# Patient Record
Sex: Female | Born: 1990 | Race: White | Hispanic: No | Smoking: Current every day smoker
Health system: Southern US, Community
[De-identification: ages and names within clinical notes are randomized; demographics above are authoritative.]

## PROBLEM LIST (undated history)

## (undated) ENCOUNTER — Inpatient Hospital Stay (HOSPITAL_COMMUNITY): Payer: Self-pay

## (undated) DIAGNOSIS — Z8639 Personal history of other endocrine, nutritional and metabolic disease: Secondary | ICD-10-CM

## (undated) DIAGNOSIS — Z3490 Encounter for supervision of normal pregnancy, unspecified, unspecified trimester: Secondary | ICD-10-CM

## (undated) DIAGNOSIS — K439 Ventral hernia without obstruction or gangrene: Secondary | ICD-10-CM

## (undated) DIAGNOSIS — Z9889 Other specified postprocedural states: Secondary | ICD-10-CM

## (undated) DIAGNOSIS — O09291 Supervision of pregnancy with other poor reproductive or obstetric history, first trimester: Secondary | ICD-10-CM

## (undated) DIAGNOSIS — Z87442 Personal history of urinary calculi: Secondary | ICD-10-CM

## (undated) DIAGNOSIS — R112 Nausea with vomiting, unspecified: Secondary | ICD-10-CM

## (undated) DIAGNOSIS — Z87898 Personal history of other specified conditions: Secondary | ICD-10-CM

## (undated) DIAGNOSIS — F32A Depression, unspecified: Secondary | ICD-10-CM

## (undated) DIAGNOSIS — N200 Calculus of kidney: Secondary | ICD-10-CM

## (undated) DIAGNOSIS — F419 Anxiety disorder, unspecified: Secondary | ICD-10-CM

## (undated) DIAGNOSIS — F329 Major depressive disorder, single episode, unspecified: Secondary | ICD-10-CM

## (undated) DIAGNOSIS — G43019 Migraine without aura, intractable, without status migrainosus: Secondary | ICD-10-CM

## (undated) DIAGNOSIS — N1330 Unspecified hydronephrosis: Secondary | ICD-10-CM

## (undated) HISTORY — PX: CHOLECYSTECTOMY: SHX55

## (undated) HISTORY — PX: FINGER SURGERY: SHX640

## (undated) HISTORY — PX: CYSTOSCOPY WITH STENT PLACEMENT: SHX5790

## (undated) HISTORY — PX: GASTRIC BYPASS: SHX52

## (undated) HISTORY — PX: HERNIA REPAIR: SHX51

## (undated) HISTORY — DX: Encounter for supervision of normal pregnancy, unspecified, unspecified trimester: Z34.90

## (undated) HISTORY — DX: Migraine without aura, intractable, without status migrainosus: G43.019

---
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
## 2012-07-20 DIAGNOSIS — O4100X0 Oligohydramnios, unspecified trimester, not applicable or unspecified: Secondary | ICD-10-CM

## 2013-08-16 DIAGNOSIS — K219 Gastro-esophageal reflux disease without esophagitis: Secondary | ICD-10-CM | POA: Insufficient documentation

## 2013-09-29 DIAGNOSIS — F329 Major depressive disorder, single episode, unspecified: Secondary | ICD-10-CM | POA: Insufficient documentation

## 2013-09-29 DIAGNOSIS — F411 Generalized anxiety disorder: Secondary | ICD-10-CM | POA: Insufficient documentation

## 2013-09-29 DIAGNOSIS — F3289 Other specified depressive episodes: Secondary | ICD-10-CM | POA: Insufficient documentation

## 2015-07-04 LAB — OB RESULTS CONSOLE ANTIBODY SCREEN: Antibody Screen: NEGATIVE

## 2015-07-04 LAB — OB RESULTS CONSOLE GC/CHLAMYDIA
CHLAMYDIA, DNA PROBE: NEGATIVE
GC PROBE AMP, GENITAL: NEGATIVE

## 2015-07-04 LAB — OB RESULTS CONSOLE PLATELET COUNT: PLATELETS: 321 10*3/uL

## 2015-07-04 LAB — OB RESULTS CONSOLE HEPATITIS B SURFACE ANTIGEN: Hepatitis B Surface Ag: NEGATIVE

## 2015-07-04 LAB — OB RESULTS CONSOLE TSH: TSH: 1.85

## 2015-07-04 LAB — OB RESULTS CONSOLE ABO/RH: RH Type: POSITIVE

## 2015-07-04 LAB — OB RESULTS CONSOLE HGB/HCT, BLOOD
HEMATOCRIT: 41 %
Hemoglobin: 13.7 g/dL

## 2015-07-04 LAB — OB RESULTS CONSOLE HIV ANTIBODY (ROUTINE TESTING): HIV: NONREACTIVE

## 2015-07-04 LAB — OB RESULTS CONSOLE RUBELLA ANTIBODY, IGM: Rubella: IMMUNE

## 2015-07-04 LAB — OB RESULTS CONSOLE RPR: RPR: NONREACTIVE

## 2015-09-24 ENCOUNTER — Encounter (HOSPITAL_COMMUNITY): Payer: Self-pay | Admitting: *Deleted

## 2015-09-24 ENCOUNTER — Inpatient Hospital Stay (HOSPITAL_COMMUNITY)
Admission: AD | Admit: 2015-09-24 | Discharge: 2015-09-24 | Disposition: A | Discharge status: Home / Self Care | Payer: Medicaid Other | Source: Ambulatory Visit | Attending: Family Medicine | Admitting: Family Medicine

## 2015-09-24 DIAGNOSIS — O26892 Other specified pregnancy related conditions, second trimester: Secondary | ICD-10-CM | POA: Diagnosis not present

## 2015-09-24 DIAGNOSIS — R519 Headache, unspecified: Secondary | ICD-10-CM

## 2015-09-24 DIAGNOSIS — Z87442 Personal history of urinary calculi: Secondary | ICD-10-CM | POA: Insufficient documentation

## 2015-09-24 DIAGNOSIS — R1030 Lower abdominal pain, unspecified: Secondary | ICD-10-CM | POA: Diagnosis not present

## 2015-09-24 DIAGNOSIS — Z87891 Personal history of nicotine dependence: Secondary | ICD-10-CM | POA: Insufficient documentation

## 2015-09-24 DIAGNOSIS — R109 Unspecified abdominal pain: Secondary | ICD-10-CM

## 2015-09-24 DIAGNOSIS — R51 Headache: Secondary | ICD-10-CM | POA: Diagnosis not present

## 2015-09-24 DIAGNOSIS — Z3A17 17 weeks gestation of pregnancy: Secondary | ICD-10-CM | POA: Diagnosis not present

## 2015-09-24 DIAGNOSIS — Z9889 Other specified postprocedural states: Secondary | ICD-10-CM | POA: Diagnosis not present

## 2015-09-24 DIAGNOSIS — O26899 Other specified pregnancy related conditions, unspecified trimester: Secondary | ICD-10-CM

## 2015-09-24 HISTORY — DX: Calculus of kidney: N20.0

## 2015-09-24 LAB — URINALYSIS, ROUTINE W REFLEX MICROSCOPIC
Bilirubin Urine: NEGATIVE
Glucose, UA: 100 mg/dL — AB
Ketones, ur: 15 mg/dL — AB
LEUKOCYTES UA: NEGATIVE
Nitrite: NEGATIVE
PROTEIN: NEGATIVE mg/dL
SPECIFIC GRAVITY, URINE: 1.025 (ref 1.005–1.030)
pH: 6.5 (ref 5.0–8.0)

## 2015-09-24 LAB — CBC
HCT: 35.5 % — ABNORMAL LOW (ref 36.0–46.0)
Hemoglobin: 12.4 g/dL (ref 12.0–15.0)
MCH: 33.3 pg (ref 26.0–34.0)
MCHC: 34.9 g/dL (ref 30.0–36.0)
MCV: 95.4 fL (ref 78.0–100.0)
PLATELETS: 244 10*3/uL (ref 150–400)
RBC: 3.72 MIL/uL — ABNORMAL LOW (ref 3.87–5.11)
RDW: 13 % (ref 11.5–15.5)
WBC: 10.4 10*3/uL (ref 4.0–10.5)

## 2015-09-24 LAB — URINE MICROSCOPIC-ADD ON

## 2015-09-24 MED ORDER — IBUPROFEN 600 MG PO TABS
600.0000 mg | ORAL_TABLET | Freq: Once | ORAL | Status: AC
  Administered 2015-09-24: 600 mg via ORAL
  Filled 2015-09-24: qty 1

## 2015-09-24 MED ORDER — BUTALBITAL-APAP-CAFFEINE 50-325-40 MG PO TABS: 1.0000 | ORAL_TABLET | Freq: Four times a day (QID) | ORAL | Status: DC | PRN

## 2015-09-24 NOTE — Discharge Instructions (Signed)
General Headache Without Cause A headache is pain or discomfort felt around the head or neck area. The specific cause of a headache may not be found. There are many causes and types of headaches. A few common ones are:  Tension headaches.  Migraine headaches.  Cluster headaches.  Chronic daily headaches. HOME CARE INSTRUCTIONS  Watch your condition for any changes. Take these steps to help with your condition: Managing Pain  Take over-the-counter and prescription medicines only as told by your health care provider.  Lie down in a dark, quiet room when you have a headache.  If directed, apply ice to the head and neck area:  Put ice in a plastic bag.  Place a towel between your skin and the bag.  Leave the ice on for 20 minutes, 2-3 times per day.  Use a heating pad or hot shower to apply heat to the head and neck area as told by your health care provider.  Keep lights dim if bright lights bother you or make your headaches worse. Eating and Drinking  Eat meals on a regular schedule.  Limit alcohol use.  Decrease the amount of caffeine you drink, or stop drinking caffeine. General Instructions  Keep all follow-up visits as told by your health care provider. This is important.  Keep a headache journal to help find out what may trigger your headaches. For example, write down:  What you eat and drink.  How much sleep you get.  Any change to your diet or medicines.  Try massage or other relaxation techniques.  Limit stress.  Sit up straight, and do not tense your muscles.  Do not use tobacco products, including cigarettes, chewing tobacco, or e-cigarettes. If you need help quitting, ask your health care provider.  Exercise regularly as told by your health care provider.  Sleep on a regular schedule. Get 7-9 hours of sleep, or the amount recommended by your health care provider. SEEK MEDICAL CARE IF:   Your symptoms are not helped by medicine.  You have a  headache that is different from the usual headache.  You have nausea or you vomit.  You have a fever. SEEK IMMEDIATE MEDICAL CARE IF:   Your headache becomes severe.  You have repeated vomiting.  You have a stiff neck.  You have a loss of vision.  You have problems with speech.  You have pain in the eye or ear.  You have muscular weakness or loss of muscle control.  You lose your balance or have trouble walking.  You feel faint or pass out.  You have confusion.   This information is not intended to replace advice given to you by your health care provider. Make sure you discuss any questions you have with your health care provider.   Document Released: 02/23/2005 Document Revised: 11/14/2014 Document Reviewed: 06/18/2014 Elsevier Interactive Patient Education 2016 [employer]  Round Ligament Pain During Pregnancy   Round ligament pain is a sharp pain or jabbing feeling often felt in the lower belly or groin area on one or both sides. It is one of the most common complaints during pregnancy and is considered a normal part of pregnancy. It is most often felt during the second trimester.   Here is what you need to know about round ligament pain, including some tips to help you feel better.   Causes of Round Ligament Pain   Several thick ligaments surround and support your womb (uterus) as it grows during pregnancy. One of them is called  the round ligament.   The round ligament connects the front part of the womb to your groin, the area where your legs attach to your pelvis. The round ligament normally tightens and relaxes slowly.   As your baby and womb grow, the round ligament stretches. That makes it more likely to become strained.   Sudden movements can cause the ligament to tighten quickly, like a rubber band snapping. This causes a sudden and quick jabbing feeling.   Symptoms of Round Ligament Pain   Round ligament pain can be concerning and uncomfortable. But it  is considered normal as your body changes during pregnancy.   The symptoms of round ligament pain include a sharp, sudden spasm in the belly. It usually affects the right side, but it may happen on both sides. The pain only lasts a few seconds.   Exercise may cause the pain, as will rapid movements such as:  sneezing  coughing  laughing  rolling over in bed  standing up too quickly   Treatment of Round Ligament Pain   Here are some tips that may help reduce your discomfort:   Pain relief. Take over-the-counter acetaminophen for pain, if necessary. Ask your doctor if this is OK.   Exercise. Get plenty of exercise to keep your stomach (core) muscles strong. Doing stretching exercises or prenatal yoga can be helpful. Ask your doctor which exercises are safe for you and your baby.   A helpful exercise involves putting your hands and knees on the floor, lowering your head, and pushing your backside into the air.   Avoid sudden movements. Change positions slowly (such as standing up or sitting down) to avoid sudden movements that may cause stretching and pain.   Flex your hips. Bend and flex your hips before you cough, sneeze, or laugh to avoid pulling on the ligaments.   Apply warmth. A heating pad or warm bath may be helpful. Ask your doctor if this is OK. Extreme heat can be dangerous to the baby.   You should try to modify your daily activity level and avoid positions that may worsen the condition.   When to Call the Doctor/Midwife   Always tell your doctor or midwife about any type of pain you have during pregnancy. Round ligament pain is quick and doesn't last long.   Call your health care provider immediately if you have:  severe pain  fever  chills  pain on urination  difficulty walking   Belly pain during pregnancy can be due to many different causes. It is important for your doctor to rule out more serious conditions, including pregnancy complications such as placenta  abruption or non-pregnancy illnesses such as:  inguinal hernia  appendicitis  stomach, liver, and kidney problems  Preterm labor pains may sometimes be mistaken for round ligament pain.

## 2015-09-24 NOTE — MAU Note (Signed)
Patient was given the option to have a stronger pain med for her HA if she can arrange for transportation home or go home with Rx to pick up. Patient states she will go home with Rx since she lives in [place] and would have no way to get her car home.

## 2015-09-24 NOTE — MAU Note (Addendum)
Pt C/O migraine HA x 2 days, swelling in feet & hands, sharp abd pain - lower & middle.  Hx of preeclampsia with last pregnancy.  Denies bleeding.  Has been pt of Dr. Shawnie Ponsorn, has upcoming appt with F/P in [place].

## 2015-09-24 NOTE — MAU Provider Note (Signed)
Chief Complaint: Headache and Abdominal Pain  First Provider Initiated Contact with Patient 09/24/15 1628      SUBJECTIVE HPI: Gabrielle Roy is a 25 y.o. G2P0101 at [redacted]w[redacted]d who presents to [facility] reporting headache 1 week but does not resolve with Tylenol 500 mg tablets. Has had headaches in the past but this once lasted longer than others. Concerned because she has a history of preeclampsia in previous pregnancy.   Has also had sharp, low abdominal pain intermittently for the last month, but none now. Normal ultrasounds Dr. Tawni Levy office and [facility] per patient when previously evaluated for this problem. Records not available at this time.   Has been pt of Dr. Shawnie Pons, has upcoming appt with F/P in [place]  Location: Bilateral temples Quality: Pressure Severity: 6/10 on pain scale Duration: One week Context: None Timing: Constant Modifying factors: No improvement with 500 mg Tylenol doses. Associated signs and symptoms: Positive for sensitivity to light and sound. Negative for fever, chills, difficulties with speech or gait, weakness, congestion, sinus pressure, neck pain, vision changes.  Past Medical History  Diagnosis Date  . Kidney stone    OB History  Gravida Para Term Preterm AB SAB TAB Ectopic Multiple Living  2 1  1      1     # Outcome Date GA Lbr Len/2nd Weight Sex Delivery Anes PTL Lv  2 Current           1 Preterm 07/20/12    F CS-LTranv   Y     Past Surgical History  Procedure Laterality Date  . Cesarean section    . Kidney stint    . Finger surgery     Social History   Social History  . Marital Status: Married    Spouse Name: N/A  . Number of Children: N/A  . Years of Education: N/A   Occupational History  . Not on file.   Social History Main Topics  . Smoking status: Former Games developer  . Smokeless tobacco: Not on file  . Alcohol Use: No  . Drug Use: No  . Sexual Activity: Yes   Other Topics Concern  . Not on file    Social History Narrative  . No narrative on file   No current facility-administered medications on file prior to encounter.   No current outpatient prescriptions on file prior to encounter.   Allergies  Allergen Reactions  . Nyquil Multi-Symptom [Pseudoeph-Doxylamine-Dm-Apap] Hives    Hives     I have reviewed the past Medical Hx, Surgical Hx, Social Hx, Allergies and Medications.   Review of Systems  OBJECTIVE Patient Vitals for the past 24 hrs:  BP Temp Temp src Pulse Resp Height Weight  09/24/15 1535 125/77 mmHg 98.2 F (36.8 C) Oral 96 18 5\' 3"  (1.6 m) 283 lb (128.368 kg)   Constitutional: Well-developed, well-nourished female in no acute distress.  Head: No congestion or sinus tenderness. Grossly normal vision. Normal range of motion of neck Cardiovascular: normal rate Respiratory: normal rate and effort.  GI: Abd soft, mild, diffuse lower abdominal tenderness, gravid appropriate for gestational age.  Neurologic: Alert and oriented x 4. Normal speech and gait. Symmetric facial movements. GU: Neg CVAT.  SPECULUM EXAM: Declined  Fetal heart rate 146 by Doppler  LAB RESULTS Results for orders placed or performed during the hospital encounter of 09/24/15 (from the past 24 hour(s))  Urinalysis, Routine w reflex microscopic (not at [facility])     Status: Abnormal   Collection Time: 09/24/15  3:40 PM  Result Value Ref Range   Color, Urine YELLOW YELLOW   APPearance CLEAR CLEAR   Specific Gravity, Urine 1.025 1.005 - 1.030   pH 6.5 5.0 - 8.0   Glucose, UA 100 (A) NEGATIVE mg/dL   Hgb urine dipstick TRACE (A) NEGATIVE   Bilirubin Urine NEGATIVE NEGATIVE   Ketones, ur 15 (A) NEGATIVE mg/dL   Protein, ur NEGATIVE NEGATIVE mg/dL   Nitrite NEGATIVE NEGATIVE   Leukocytes, UA NEGATIVE NEGATIVE  Urine microscopic-add on     Status: Abnormal   Collection Time: 09/24/15  3:40 PM  Result Value Ref Range   Squamous Epithelial / LPF 0-5 (A) NONE SEEN   WBC, UA 0-5 0 - 5 WBC/hpf    RBC / HPF 0-5 0 - 5 RBC/hpf   Bacteria, UA FEW (A) NONE SEEN   Urine-Other MUCOUS PRESENT   CBC     Status: Abnormal   Collection Time: 09/24/15  5:12 PM  Result Value Ref Range   WBC 10.4 4.0 - 10.5 K/uL   RBC 3.72 (L) 3.87 - 5.11 MIL/uL   Hemoglobin 12.4 12.0 - 15.0 g/dL   HCT 16.1 (L) 09.6 - 04.5 %   MCV 95.4 78.0 - 100.0 fL   MCH 33.3 26.0 - 34.0 pg   MCHC 34.9 30.0 - 36.0 g/dL   RDW 40.9 81.1 - 91.4 %   Platelets 244 150 - 400 K/uL    IMAGING No results found.  MAU COURSE UA, CBC, ibuprofen given.  Reassured patient that she does not have preeclampsia based on normal blood pressures, early gestational age, and lack of protein in urine. No improvement in headache. Offered Fioricet if patient can get a ride, but she declines and says she feels find go home but would like a prescription.  MDM - 25 year old female at [redacted] weeks gestation with longer-lasting headaches than her usual headaches, but no evidence of emergent intracranial process. No evidence of preeclampsia. She is non-toxic-appearing appropriate for discharge.  ASSESSMENT 1. Headache in pregnancy, antepartum, second trimester   2. Abdominal pain affecting pregnancy, antepartum     PLAN Discharge home in stable condition. Headache red flags reviewed. Abdominal pain in pregnancy precautions Comfort measures. Rx Fioricet .     Follow-up Information    Follow up with [facility] [place] On 10/04/2015.   Specialty:  Obstetrics and Gynecology   Contact information:   [address] [place] [address] [phone]      Follow up with [facility].   Why:  As needed in pregnancy emergencies   Contact information:   [address] [id number] mc [place] [address] [phone]      Follow up with [facility] EMERGENCY DEPARTMENT.   Specialty:  Emergency Medicine   Why:   For headache emergencies   Contact information:   [address] [id number] mc [place] [address] [phone]       Medication List    STOP taking these medications        acetaminophen 500 MG tablet  Commonly known as:  TYLENOL      TAKE these medications        butalbital-acetaminophen-caffeine 50-325-40 MG tablet  Commonly known as:  FIORICET  Take 1-2 tablets by mouth every 6 (six) hours as needed for headache.     prenatal multivitamin Tabs tablet  Take 1 tablet by mouth daily at 12 noon.  Harahan[place]ia Kinjal Neitzke, [place] 09/24/2015  6:27 PM

## 2015-10-04 ENCOUNTER — Ambulatory Visit (INDEPENDENT_AMBULATORY_CARE_PROVIDER_SITE_OTHER): Payer: Medicaid Other | Admitting: Family

## 2015-10-04 ENCOUNTER — Encounter: Payer: Self-pay | Admitting: *Deleted

## 2015-10-04 ENCOUNTER — Encounter: Payer: Self-pay | Admitting: Family

## 2015-10-04 VITALS — BP 124/79 | HR 93 | Wt 282.0 lb

## 2015-10-04 DIAGNOSIS — Z363 Encounter for antenatal screening for malformations: Secondary | ICD-10-CM

## 2015-10-04 DIAGNOSIS — Z3492 Encounter for supervision of normal pregnancy, unspecified, second trimester: Secondary | ICD-10-CM

## 2015-10-04 DIAGNOSIS — Z36 Encounter for antenatal screening of mother: Secondary | ICD-10-CM

## 2015-10-04 DIAGNOSIS — Z3490 Encounter for supervision of normal pregnancy, unspecified, unspecified trimester: Secondary | ICD-10-CM

## 2015-10-04 DIAGNOSIS — Z3482 Encounter for supervision of other normal pregnancy, second trimester: Secondary | ICD-10-CM

## 2015-10-04 DIAGNOSIS — Z1389 Encounter for screening for other disorder: Secondary | ICD-10-CM

## 2015-10-04 DIAGNOSIS — F419 Anxiety disorder, unspecified: Secondary | ICD-10-CM

## 2015-10-04 DIAGNOSIS — O99342 Other mental disorders complicating pregnancy, second trimester: Secondary | ICD-10-CM

## 2015-10-04 DIAGNOSIS — O34219 Maternal care for unspecified type scar from previous cesarean delivery: Secondary | ICD-10-CM

## 2015-10-04 HISTORY — DX: Encounter for supervision of normal pregnancy, unspecified, unspecified trimester: Z34.90

## 2015-10-04 MED ORDER — CONCEPT DHA 53.5-38-1 MG PO CAPS: 1.0000 | ORAL_CAPSULE | Freq: Every day | ORAL | 2 refills | Status: DC

## 2015-10-04 NOTE — Progress Notes (Signed)
   Subjective:    Gabrielle Roy is a G2P0101 [redacted]w[redacted]d being seen today for her first obstetrical visit.  Transferring from Dr. Tawni Levy office.  Her obstetrical history is significant for history of amniotic band and oligohydramnios with delivery at 35 wks.  Reports having an emergency csection due to fetal distress at [facility].  Also verbalizes concern with increased anxiety since pregnant.  Feels extremely anxious when interacting with daughter due to increased demands.  Pt desires to [facility].  Patient does intend to breast feed. Pregnancy history fully reviewed.  Patient reports bleeding in early pregnancy with last occurence in early June.  Reports going to the hospital and told everything was okay.  .  Vitals:   10/04/15 0931  BP: 124/79  Pulse: 93  Weight: 282 lb (127.9 kg)    HISTORY: OB History  Gravida Para Term Preterm AB Living  2 1   1   1   SAB TAB Ectopic Multiple Live Births          1    # Outcome Date GA Lbr Len/2nd Weight Sex Delivery Anes PTL Lv  2 Current           1 Preterm 07/20/12    F CS-LTranv   LIV     Past Medical History:  Diagnosis Date  . Kidney stone    Past Surgical History:  Procedure Laterality Date  . CESAREAN SECTION    . FINGER SURGERY    . kidney stint     Family History  Problem Relation Age of Onset  . Hypertension Father   . Heart disease Father      Exam     Assessment:    Pregnancy: G2P0101 Patient Active Problem List   Diagnosis Date Noted  . Supervision of normal pregnancy, antepartum 10/04/2015  . History of cesarean delivery, antepartum 10/04/2015        Plan:     Release of records on file - obtain from Dr. Shawnie Pons Prenatal vitamins. Refer to [facility] for [facility] Problem list reviewed and updated. Genetic Screening discussed:  Review prenatal records from Dr. Shawnie Pons  Ultrasound discussed; fetal survey: ordered.  Follow up in 4 weeks.  Marlis Edelson 10/04/2015

## 2015-10-04 NOTE — Patient Instructions (Addendum)
Consider utilizing [facility] to assist with having medication free labor.    Second Trimester of Pregnancy The second trimester is from week 13 through week 28, months 4 through 6. The second trimester is often a time when you feel your best. Your body has also adjusted to being pregnant, and you begin to feel better physically. Usually, morning sickness has lessened or quit completely, you may have more energy, and you may have an increase in appetite. The second trimester is also a time when the fetus is growing rapidly. At the end of the sixth month, the fetus is about 9 inches long and weighs about 1 pounds. You will likely begin to feel the baby move (quickening) between 18 and 20 weeks of the pregnancy. BODY CHANGES Your body goes through many changes during pregnancy. The changes vary from woman to woman.   Your weight will continue to increase. You will notice your lower abdomen bulging out.  You may begin to get stretch marks on your hips, abdomen, and breasts.  You may develop headaches that can be relieved by medicines approved by your health care provider.  You may urinate more often because the fetus is pressing on your bladder.  You may develop or continue to have heartburn as a result of your pregnancy.  You may develop constipation because certain hormones are causing the muscles that push waste through your intestines to slow down.  You may develop hemorrhoids or swollen, bulging veins (varicose veins).  You may have back pain because of the weight gain and pregnancy hormones relaxing your joints between the bones in your pelvis and as a result of a shift in weight and the muscles that support your balance.  Your breasts will continue to grow and be tender.  Your gums may bleed and may be sensitive to brushing and flossing.  Dark spots or blotches (chloasma, mask of pregnancy) may develop on your face. This will likely fade after the baby is born.  A dark line from your  belly button to the pubic area (linea nigra) may appear. This will likely fade after the baby is born.  You may have changes in your hair. These can include thickening of your hair, rapid growth, and changes in texture. Some women also have hair loss during or after pregnancy, or hair that feels dry or thin. Your hair will most likely return to normal after your baby is born. WHAT TO EXPECT AT YOUR PRENATAL VISITS During a routine prenatal visit:  You will be weighed to make sure you and the fetus are growing normally.  Your blood pressure will be taken.  Your abdomen will be measured to track your baby's growth.  The fetal heartbeat will be listened to.  Any test results from the previous visit will be discussed. Your health care provider may ask you:  How you are feeling.  If you are feeling the baby move.  If you have had any abnormal symptoms, such as leaking fluid, bleeding, severe headaches, or abdominal cramping.  If you are using any tobacco products, including cigarettes, chewing tobacco, and electronic cigarettes.  If you have any questions. Other tests that may be performed during your second trimester include:  Blood tests that check for:  Low iron levels (anemia).  Gestational diabetes (between 24 and 28 weeks).  Rh antibodies.  Urine tests to check for infections, diabetes, or protein in the urine.  An ultrasound to confirm the proper growth and development of the baby.  An  amniocentesis to check for possible genetic problems.  Fetal screens for spina bifida and Down syndrome.  HIV (human immunodeficiency virus) testing. Routine prenatal testing includes screening for HIV, unless you choose not to have this test. HOME CARE INSTRUCTIONS   Avoid all smoking, herbs, alcohol, and unprescribed drugs. These chemicals affect the formation and growth of the baby.  Do not use any tobacco products, including cigarettes, chewing tobacco, and electronic cigarettes.  If you need help quitting, ask your health care provider. You may receive counseling support and other resources to help you quit.  Follow your health care provider's instructions regarding medicine use. There are medicines that are either safe or unsafe to take during pregnancy.  Exercise only as directed by your health care provider. Experiencing uterine cramps is a good sign to stop exercising.  Continue to eat regular, healthy meals.  Wear a good support bra for breast tenderness.  Do not use hot tubs, steam rooms, or saunas.  Wear your seat belt at all times when driving.  Avoid raw meat, uncooked cheese, cat litter boxes, and soil used by cats. These carry germs that can cause birth defects in the baby.  Take your prenatal vitamins.  Take 1500-2000 mg of calcium daily starting at the 20th week of pregnancy until you deliver your baby.  Try taking a stool softener (if your health care provider approves) if you develop constipation. Eat more high-fiber foods, such as fresh vegetables or fruit and whole grains. Drink plenty of fluids to keep your urine clear or pale yellow.  Take warm sitz baths to soothe any pain or discomfort caused by hemorrhoids. Use hemorrhoid cream if your health care provider approves.  If you develop varicose veins, wear support hose. Elevate your feet for 15 minutes, 3-4 times a day. Limit salt in your diet.  Avoid heavy lifting, wear low heel shoes, and practice good posture.  Rest with your legs elevated if you have leg cramps or low back pain.  Visit your dentist if you have not gone yet during your pregnancy. Use a soft toothbrush to brush your teeth and be gentle when you floss.  A sexual relationship may be continued unless your health care provider directs you otherwise.  Continue to go to all your prenatal visits as directed by your health care provider. SEEK MEDICAL CARE IF:   You have dizziness.  You have mild pelvic cramps, pelvic  pressure, or nagging pain in the abdominal area.  You have persistent nausea, vomiting, or diarrhea.  You have a bad smelling vaginal discharge.  You have pain with urination. SEEK IMMEDIATE MEDICAL CARE IF:   You have a fever.  You are leaking fluid from your vagina.  You have spotting or bleeding from your vagina.  You have severe abdominal cramping or pain.  You have rapid weight gain or loss.  You have shortness of breath with chest pain.  You notice sudden or extreme swelling of your face, hands, ankles, feet, or legs.  You have not felt your baby move in over an hour.  You have severe headaches that do not go away with medicine.  You have vision changes.   This information is not intended to replace advice given to you by your health care provider. Make sure you discuss any questions you have with your health care provider.   Document Released: 02/17/2001 Document Revised: 03/16/2014 Document Reviewed: 04/26/2012 Elsevier Interactive Patient Education 2016 [employer].  Trial of Labor After Cesarean Delivery Information A  trial of labor after cesarean delivery (TOLAC) is when a woman tries to give birth vaginally after a previous cesarean delivery. TOLAC may be a safe and appropriate option for you depending on your medical history and other risk factors. When TOLAC is successful and you are able to have a vaginal delivery, this is called a vaginal birth after cesarean delivery (VBAC).  CANDIDATES FOR TOLAC TOLAC is possible for some women who:  Have undergone one or two prior cesarean deliveries in which the incision of the uterus was horizontal (low transverse).  Are carrying twins and have had one prior low transverse incision during a cesarean delivery.  Do not have a vertical (classical) uterine scar.  Have not had a tear in the wall of their uterus (uterine rupture). TOLAC is also supported for women who meet appropriate criteria and:  Are under the age  of 40 years.  Are tall and have a body mass index (BMI) of less than 30.  Have an unknown uterine scar.  Give birth in a facility equipped to handle an emergency cesarean delivery. This team should be able to handle possible complications such as a uterine rupture.  Have thorough counseling about the benefits and risks of TOLAC.  Have discussed future pregnancy plans with their health care provider.  Plan to have several more pregnancies. MOST SUCCESSFUL CANDIDATES FOR TOLAC:  Have had a successful vaginal delivery before or after their cesarean delivery.  Experience labor that begins naturally on or before the due date (40 weeks of gestation).  Do not have a very large (macrosomic) baby.   Had a prior cesarean delivery but are not currently experiencing factors that would prompt a cesarean delivery (such as a breech position).  Had only one prior cesarean delivery.  Had a prior cesarean delivery that was performed early in labor and not after full cervical dilation. TOLAC may be most appropriate for women who meet the above guidelines and who plan to have more pregnancies. TOLAC is not recommended for home births. LEAST SUCCESSFUL CANDIDATES FOR TOLAC:  Have an induced labor with an unfavorable cervix. An unfavorable cervix is when the cervix is not dilating enough (among other factors).  Have never had a vaginal delivery.  Have had more than two cesarean deliveries.  Have a pregnancy at more than 40 weeks of gestation.  Are pregnant with a baby with a suspected weight greater than 4,000 grams (8 pounds) and who have no prior history of a vaginal delivery.  Have closely spaced pregnancies. SUGGESTED BENEFITS OF TOLAC  You may have a faster recovery time.  You may have a shorter stay in the hospital.  You may have less pain and fewer problems than with a cesarean delivery. Women who have a cesarean delivery have a higher chance of needing blood or getting a fever, an  infection, or a blood clot in the legs. SUGGESTED RISKS OF TOLAC The highest risk of complications happens to women who attempt a TOLAC and fail. A failed TOLAC results in an unplanned cesarean delivery. Risks related to [facility] or repeat cesarean deliveries include:   Blood loss.  Infection.  Blood clot.  Injury to surrounding tissues or organs.  Having to remove the uterus (hysterectomy).  Potential problems with the placenta (such as placenta previa or placenta accreta) in future pregnancies. Although very rare, the main concerns with TOLAC are:  Rupture of the uterine scar from a past cesarean delivery.  Needing an emergency cesarean delivery.  Having a bad  outcome for the baby (perinatal morbidity). FOR MORE INFORMATION American Congress of Obstetricians and Gynecologists: www.acog.org [employer]-Midwives: www.midwife.org   This information is not intended to replace advice given to you by your health care provider. Make sure you discuss any questions you have with your health care provider.   Document Released: 11/11/2010 Document Revised: 12/14/2012 Document Reviewed: 08/15/2012 Elsevier Interactive Patient Education [employer]

## 2015-10-07 ENCOUNTER — Other Ambulatory Visit: Payer: Self-pay | Admitting: Family

## 2015-10-07 ENCOUNTER — Ambulatory Visit (INDEPENDENT_AMBULATORY_CARE_PROVIDER_SITE_OTHER): Payer: Self-pay | Admitting: Clinical

## 2015-10-07 ENCOUNTER — Ambulatory Visit (HOSPITAL_COMMUNITY)
Admission: RE | Admit: 2015-10-07 | Discharge: 2015-10-07 | Disposition: A | Discharge status: Home / Self Care | Payer: Medicaid Other | Source: Ambulatory Visit | Attending: Family | Admitting: Family

## 2015-10-07 DIAGNOSIS — Z1389 Encounter for screening for other disorder: Secondary | ICD-10-CM

## 2015-10-07 DIAGNOSIS — Z3A18 18 weeks gestation of pregnancy: Secondary | ICD-10-CM

## 2015-10-07 DIAGNOSIS — O99212 Obesity complicating pregnancy, second trimester: Secondary | ICD-10-CM

## 2015-10-07 DIAGNOSIS — O09212 Supervision of pregnancy with history of pre-term labor, second trimester: Secondary | ICD-10-CM | POA: Insufficient documentation

## 2015-10-07 DIAGNOSIS — F4322 Adjustment disorder with anxiety: Secondary | ICD-10-CM

## 2015-10-07 DIAGNOSIS — O34219 Maternal care for unspecified type scar from previous cesarean delivery: Secondary | ICD-10-CM | POA: Insufficient documentation

## 2015-10-07 NOTE — Progress Notes (Signed)
  ASSESSMENT: Pt currently experiencing Adjustment disorder with anxiety. Pt needs to f/u with OB and [facility]. Pt would benefit from psychoeducation and brief therapeutic interventions regarding coping with symptoms of anxiety.  Stage of Change: determination  PLAN: 1. F/U with behavioral health clinician in one month, or as needed 2. Psychiatric Medications: none 3. Behavioral recommendations:   -Practice  relaxation breathing exercise, after taking prenatal vitamin daily -Consider using "worry hour" technique to prioritize worries she has some control over -www.padanc.org to contact birth doulas in [place] to find out price options for birth doula -Magazine features editor Brandi Collins-Calhoun at [phone], ext [phone] to inquire about birth doula availability and cost -Scientist, product/process development regarding coping with symptoms of anxiety   SUBJECTIVE: Pt. referred by Rochele Pages, CNM,  for symptoms of anxiety Pt. reports the following symptoms/concerns: Pt states an increase in feelings of anxiety during this pregnancy only, that keeps prevents her from being as active with 3yo daughter as she would like; primary area of concern today is stress and anxiety over wanting to use a birth doula vs. Paying for a birth doula Duration of problem: At least one month Severity: moderate   OBJECTIVE: Orientation & Cognition: Oriented x3. Thought processes normal and appropriate to situation. Mood: appropriate Affect: appropriate Appearance: appropriate Risk of harm to self or others: no known risk of harm to self or others Substance use: none Assessments administered: PHQ9: 8/ GAD7: 14  Diagnosis: Adjustment disorder with anxiety CPT Code: F43.22  -------------------------------------------- Other(s) present in the room: none   Time spent with patient in exam room: 60 minutes 1:05 to 2:05pm  Depression screen PHQ 2/9 10/07/2015  Decreased Interest 2  Down, Depressed, Hopeless 1   PHQ - 2 Score 3  Altered sleeping 1  Tired, decreased energy 2  Change in appetite 0  Feeling bad or failure about yourself  0  Trouble concentrating 1  Moving slowly or fidgety/restless 1  Suicidal thoughts 0  PHQ-9 Score 8   GAD 7 : Generalized Anxiety Score 10/07/2015  Nervous, Anxious, on Edge 3  Control/stop worrying 3  Worry too much - different things 3  Trouble relaxing 2  Restless 0  Easily annoyed or irritable 3  Afraid - awful might happen 0  Total GAD 7 Score 14

## 2015-10-09 ENCOUNTER — Encounter: Payer: Self-pay | Admitting: Family

## 2015-10-09 DIAGNOSIS — O4440 Low lying placenta NOS or without hemorrhage, unspecified trimester: Secondary | ICD-10-CM | POA: Insufficient documentation

## 2015-10-15 NOTE — Addendum Note (Signed)
Addended by: Granville LewisLARK, LORA L on: 10/15/2015 01:41 PM   Modules accepted: Orders

## 2015-11-01 ENCOUNTER — Encounter: Payer: Medicaid Other | Admitting: Family

## 2015-11-05 ENCOUNTER — Other Ambulatory Visit: Payer: Self-pay | Admitting: Family

## 2015-11-05 ENCOUNTER — Ambulatory Visit (HOSPITAL_COMMUNITY)
Admission: RE | Admit: 2015-11-05 | Discharge: 2015-11-05 | Disposition: A | Discharge status: Home / Self Care | Payer: Medicaid Other | Source: Ambulatory Visit | Attending: Family | Admitting: Family

## 2015-11-05 DIAGNOSIS — O09292 Supervision of pregnancy with other poor reproductive or obstetric history, second trimester: Secondary | ICD-10-CM | POA: Diagnosis not present

## 2015-11-05 DIAGNOSIS — Z3A23 23 weeks gestation of pregnancy: Secondary | ICD-10-CM

## 2015-11-05 DIAGNOSIS — O09212 Supervision of pregnancy with history of pre-term labor, second trimester: Secondary | ICD-10-CM | POA: Diagnosis present

## 2015-11-05 DIAGNOSIS — Z36 Encounter for antenatal screening of mother: Secondary | ICD-10-CM | POA: Insufficient documentation

## 2015-11-05 DIAGNOSIS — Z1389 Encounter for screening for other disorder: Secondary | ICD-10-CM

## 2015-11-05 DIAGNOSIS — Z3482 Encounter for supervision of other normal pregnancy, second trimester: Secondary | ICD-10-CM

## 2015-11-05 DIAGNOSIS — Z8751 Personal history of pre-term labor: Secondary | ICD-10-CM

## 2015-11-06 ENCOUNTER — Ambulatory Visit (INDEPENDENT_AMBULATORY_CARE_PROVIDER_SITE_OTHER): Payer: Medicaid Other | Admitting: Obstetrics & Gynecology

## 2015-11-06 VITALS — BP 118/78 | HR 88 | Wt 283.0 lb

## 2015-11-06 DIAGNOSIS — O99342 Other mental disorders complicating pregnancy, second trimester: Secondary | ICD-10-CM

## 2015-11-06 DIAGNOSIS — O4442 Low lying placenta NOS or without hemorrhage, second trimester: Secondary | ICD-10-CM

## 2015-11-06 DIAGNOSIS — O4440 Low lying placenta NOS or without hemorrhage, unspecified trimester: Secondary | ICD-10-CM

## 2015-11-06 DIAGNOSIS — O99212 Obesity complicating pregnancy, second trimester: Secondary | ICD-10-CM

## 2015-11-06 DIAGNOSIS — R319 Hematuria, unspecified: Secondary | ICD-10-CM | POA: Diagnosis not present

## 2015-11-06 DIAGNOSIS — F419 Anxiety disorder, unspecified: Secondary | ICD-10-CM

## 2015-11-06 DIAGNOSIS — E669 Obesity, unspecified: Secondary | ICD-10-CM

## 2015-11-06 DIAGNOSIS — O34219 Maternal care for unspecified type scar from previous cesarean delivery: Secondary | ICD-10-CM

## 2015-11-06 DIAGNOSIS — Z3482 Encounter for supervision of other normal pregnancy, second trimester: Secondary | ICD-10-CM

## 2015-11-06 DIAGNOSIS — O99210 Obesity complicating pregnancy, unspecified trimester: Secondary | ICD-10-CM

## 2015-11-06 NOTE — Patient Instructions (Addendum)

## 2015-11-06 NOTE — Progress Notes (Signed)
Urine- large Leuks and mod blood

## 2015-11-06 NOTE — Progress Notes (Signed)
   PRENATAL VISIT NOTE  Subjective:  Gabrielle Roy is a 25 y.o. G2P0101 at 10249w1d being seen today for ongoing prenatal care.  She is currently monitored for the following issues for this high-risk pregnancy and has Supervision of normal pregnancy, antepartum; History of cesarean delivery, antepartum; Anxiety in pregnancy, antepartum; and Low lying placenta, antepartum on her problem list.  Patient reports cramping and lower abd pressuse.  Had sono yesterday.  Contractions: Irritability. Vag. Bleeding: None.  Movement: Present. Denies leaking of fluid.   The following portions of the patient's history were reviewed and updated as appropriate: allergies, current medications, past family history, past medical history, past social history, past surgical history and problem list. Problem list updated.  Objective:   Vitals:   11/06/15 0936  BP: 118/78  Pulse: 88  Weight: 283 lb (128.4 kg)    Fetal Status: Fetal Heart Rate (bpm): 152   Movement: Present     General:  Alert, oriented and cooperative. Patient is in no acute distress.  Skin: Skin is warm and dry. No rash noted.   Cardiovascular: Normal heart rate noted  Respiratory: Normal respiratory effort, no problems with respiration noted  Abdomen: Soft, gravid, appropriate for gestational age. Pain/Pressure: Present     Pelvic:  Cervical exam deferred        Extremities: Normal range of motion.  Edema: Trace  Mental Status: Normal mood and affect. Normal behavior. Normal judgment and thought content.   Urinalysis: Urine Protein: Trace Urine Glucose: Negative  Assessment and Plan:  Pregnancy: G2P0101 at 5349w1d  1. Hematuria- microscopic  - CULTURE, URINE COMPREHENSIVE  2. Supervision of normal pregnancy, antepartum, second trimester   3. Low lying placenta, antepartum Resolved see sone 11/05/2015 placenta above os  4. History of cesarean delivery, antepartum Plans for TOLAC  5. Anxiety in pregnancy, antepartum, second  trimester  6. Obesity in pregnancy Pt needs serial sonos to assess growth  Preterm labor symptoms and general obstetric precautions including but not limited to vaginal bleeding, contractions, leaking of fluid and fetal movement were reviewed in detail with the patient. Please refer to After Visit Summary for other counseling recommendations.  Return in about 4 weeks (around 12/04/2015).  Willodean Rosenthalarolyn Harraway-Smith, MD

## 2015-11-09 LAB — CULTURE, URINE COMPREHENSIVE

## 2015-11-09 NOTE — Addendum Note (Signed)
Encounter addended by: Marlis EdelsonWalidah N Karim, CNM on: 11/09/2015  8:58 AM<BR>    Actions taken: Problem List modified

## 2015-11-12 ENCOUNTER — Encounter: Payer: Self-pay | Admitting: *Deleted

## 2015-11-23 ENCOUNTER — Encounter: Payer: Self-pay | Admitting: Obstetrics & Gynecology

## 2015-11-27 ENCOUNTER — Telehealth: Payer: Self-pay | Admitting: *Deleted

## 2015-11-27 DIAGNOSIS — B373 Candidiasis of vulva and vagina: Secondary | ICD-10-CM

## 2015-11-27 DIAGNOSIS — B3731 Acute candidiasis of vulva and vagina: Secondary | ICD-10-CM

## 2015-11-27 MED ORDER — FLUCONAZOLE 150 MG PO TABS: 150.0000 mg | ORAL_TABLET | Freq: Once | ORAL | 0 refills | Status: AC

## 2015-11-27 NOTE — Telephone Encounter (Signed)
Pt called with c/o's white itchy vaginal discharge.  She is requesting something for yeast.  Diflucan 150 mg sent to [facility].

## 2015-12-02 ENCOUNTER — Encounter: Payer: Self-pay | Admitting: Obstetrics & Gynecology

## 2015-12-04 ENCOUNTER — Encounter: Payer: Self-pay | Admitting: Obstetrics & Gynecology

## 2015-12-04 ENCOUNTER — Ambulatory Visit (HOSPITAL_COMMUNITY)
Admission: RE | Admit: 2015-12-04 | Discharge: 2015-12-04 | Disposition: A | Discharge status: Home / Self Care | Payer: Medicaid Other | Source: Ambulatory Visit | Attending: Obstetrics & Gynecology | Admitting: Obstetrics & Gynecology

## 2015-12-04 ENCOUNTER — Other Ambulatory Visit: Payer: Self-pay | Admitting: Obstetrics & Gynecology

## 2015-12-04 ENCOUNTER — Ambulatory Visit (INDEPENDENT_AMBULATORY_CARE_PROVIDER_SITE_OTHER): Payer: Medicaid Other | Admitting: Obstetrics & Gynecology

## 2015-12-04 VITALS — BP 124/78 | Wt 283.0 lb

## 2015-12-04 DIAGNOSIS — O99212 Obesity complicating pregnancy, second trimester: Secondary | ICD-10-CM

## 2015-12-04 DIAGNOSIS — Z3A27 27 weeks gestation of pregnancy: Secondary | ICD-10-CM | POA: Insufficient documentation

## 2015-12-04 DIAGNOSIS — E669 Obesity, unspecified: Secondary | ICD-10-CM | POA: Diagnosis not present

## 2015-12-04 DIAGNOSIS — Z3483 Encounter for supervision of other normal pregnancy, third trimester: Secondary | ICD-10-CM

## 2015-12-04 DIAGNOSIS — O368120 Decreased fetal movements, second trimester, not applicable or unspecified: Secondary | ICD-10-CM | POA: Diagnosis not present

## 2015-12-04 DIAGNOSIS — Z23 Encounter for immunization: Secondary | ICD-10-CM | POA: Diagnosis not present

## 2015-12-04 DIAGNOSIS — O34219 Maternal care for unspecified type scar from previous cesarean delivery: Secondary | ICD-10-CM | POA: Insufficient documentation

## 2015-12-04 NOTE — Progress Notes (Signed)
   PRENATAL VISIT NOTE  Subjective:  Gabrielle Roy is a 25 y.o. G2P0101 at 2426w1d being seen today for ongoing prenatal care.  She is currently monitored for the following issues for this low-risk pregnancy and has Supervision of normal pregnancy, antepartum; History of cesarean delivery, antepartum; Anxiety in pregnancy, antepartum; Low lying placenta, antepartum; and Maternal obesity, antepartum on her problem list.  Patient reports decreased FM since Saturday. .  Contractions: Irritability. Vag. Bleeding: None.  Movement: (!) Decreased. Denies leaking of fluid.   The following portions of the patient's history were reviewed and updated as appropriate: allergies, current medications, past family history, past medical history, past social history, past surgical history and problem list. Problem list updated.  Objective:   Vitals:   12/04/15 0923  BP: 124/78  Weight: 283 lb (128.4 kg)    Fetal Status: Fetal Heart Rate (bpm): 148 Fundal Height: 32 cm Movement: (!) Decreased     General:  Alert, oriented and cooperative. Patient is in no acute distress.  Skin: Skin is warm and dry. No rash noted.   Cardiovascular: Normal heart rate noted  Respiratory: Normal respiratory effort, no problems with respiration noted  Abdomen: Soft, gravid, appropriate for gestational age. Pain/Pressure: Present     Pelvic:  Cervical exam deferred        Extremities: Normal range of motion.  Edema: Trace  Mental Status: Normal mood and affect. Normal behavior. Normal judgment and thought content.  Due to maternal habitus we were unable to perform a NST so she will go for a BPP. Urinalysis: Urine Protein: Trace Urine Glucose: Negative  Assessment and Plan:  Pregnancy: G2P0101 at 2426w1d  1. Maternal obesity, antepartum, second trimester   2. Supervision of normal pregnancy, antepartum, third trimester  - Glucose Tolerance, 1 HR (50g) - CBC - HIV antibody (with reflex) - RPR - Flu and TDAP vaccines 3.  Immunization due  - Flu Vaccine QUAD 36+ mos IM (Fluarix, Quad PF) - Tdap vaccine greater than or equal to 7yo IM  4. Decreased fetal movement, second trimester, not applicable or unspecified fetus - BPP today - [facility] (not emailing on the weekend) - US Fetal BPP W/O Non Stress; Future  Preterm labor symptoms and general obstetric precautions including but not limited to vaginal bleeding, contractions, leaking of fluid and fetal movement were reviewed in detail with the patient. Please refer to After Visit Summary for other counseling recommendations.  No Follow-up on file.  Allie BossierMyra C Laneah Luft, MD

## 2015-12-05 ENCOUNTER — Other Ambulatory Visit (HOSPITAL_COMMUNITY): Payer: Self-pay

## 2015-12-05 LAB — CBC
HCT: 35.3 % (ref 35.0–45.0)
Hemoglobin: 11.8 g/dL (ref 11.7–15.5)
MCH: 33.7 pg — ABNORMAL HIGH (ref 27.0–33.0)
MCHC: 33.4 g/dL (ref 32.0–36.0)
MCV: 100.9 fL — ABNORMAL HIGH (ref 80.0–100.0)
MPV: 9.8 fL (ref 7.5–12.5)
Platelets: 229 10*3/uL (ref 140–400)
RBC: 3.5 MIL/uL — ABNORMAL LOW (ref 3.80–5.10)
RDW: 13.4 % (ref 11.0–15.0)
WBC: 11 10*3/uL — AB (ref 3.8–10.8)

## 2015-12-05 LAB — HIV ANTIBODY (ROUTINE TESTING W REFLEX): HIV: NONREACTIVE

## 2015-12-06 LAB — RPR

## 2015-12-06 LAB — GLUCOSE TOLERANCE, 1 HOUR (50G) W/O FASTING: GLUCOSE, 1 HR, GESTATIONAL: 137 mg/dL (ref <140)

## 2015-12-09 ENCOUNTER — Telehealth: Payer: Self-pay

## 2015-12-09 NOTE — Telephone Encounter (Signed)
Spoke with pt and she is aware that she did not pass the one hour glucose test and will need to come back for the three hour glucose test.

## 2015-12-10 NOTE — Addendum Note (Signed)
Addended by: Kathie DikeSOLA, Tristy Udovich J on: 12/10/2015 08:09 AM   Modules accepted: Orders

## 2015-12-11 LAB — GLUCOSE TOLERANCE, 3 HOURS
GLUCOSE, 1 HOUR-GESTATIONAL: 138 mg/dL (ref <190)
GLUCOSE, 2 HOUR-GESTATIONAL: 120 mg/dL (ref <165)
Glucose Tolerance, Fasting: 88 mg/dL (ref 65–104)
Glucose, GTT - 3 Hour: 107 mg/dL (ref <145)

## 2015-12-23 ENCOUNTER — Encounter: Payer: Self-pay | Admitting: Advanced Practice Midwife

## 2015-12-23 ENCOUNTER — Ambulatory Visit (INDEPENDENT_AMBULATORY_CARE_PROVIDER_SITE_OTHER): Payer: Medicaid Other | Admitting: Advanced Practice Midwife

## 2015-12-23 VITALS — BP 126/88 | Wt 288.0 lb

## 2015-12-23 DIAGNOSIS — N898 Other specified noninflammatory disorders of vagina: Secondary | ICD-10-CM

## 2015-12-23 DIAGNOSIS — R319 Hematuria, unspecified: Secondary | ICD-10-CM

## 2015-12-23 DIAGNOSIS — O4703 False labor before 37 completed weeks of gestation, third trimester: Secondary | ICD-10-CM

## 2015-12-23 DIAGNOSIS — O219 Vomiting of pregnancy, unspecified: Secondary | ICD-10-CM

## 2015-12-23 LAB — FETAL FIBRONECTIN: FETAL FIBRONECTIN: NEGATIVE

## 2015-12-23 MED ORDER — ONDANSETRON HCL 4 MG PO TABS: 8.0000 mg | ORAL_TABLET | Freq: Three times a day (TID) | ORAL | 1 refills | Status: DC | PRN

## 2015-12-23 NOTE — Progress Notes (Signed)
   PRENATAL VISIT NOTE  Subjective:  Gabrielle Roy is a 25 y.o. G2P0101 at 4078w6d being seen today for ongoing prenatal care.  She is currently monitored for the following issues for this low-risk pregnancy and has Supervision of normal pregnancy, antepartum; History of cesarean delivery, antepartum; Anxiety in pregnancy, antepartum; Low lying placenta, antepartum; and Maternal obesity, antepartum on her problem list.  Patient reports scant vaginal bleeding 12/20/15 (none since) and frequent contractions, two this morning. Also reports nausea and vomiting 2-3 x since last night. Denies fever, chills, diarrhea, hematuria, dysuria, flank pain, urgency or frequency.  Contractions: Irregular. Vag. Bleeding: Scant.  Movement: Present. Denies leaking of fluid.   The following portions of the patient's history were reviewed and updated as appropriate: allergies, current medications, past family history, past medical history, past social history, past surgical history and problem list. Problem list updated.  Objective:   Vitals:   12/23/15 1027  BP: 126/88  Weight: 288 lb (130.6 kg)    Fetal Status: Fetal Heart Rate (bpm): 151   Movement: Present     General:  Alert, oriented and cooperative. Patient is in no acute distress.  Skin: Skin is warm and dry. No rash noted.   Cardiovascular: Normal heart rate noted  Respiratory: Normal respiratory effort, no problems with respiration noted  Abdomen: Soft, gravid, appropriate for gestational age. Pain/Pressure: Present     Pelvic:  Cervical exam performed Dilation: Closed Effacement (%): 0 Station: Ballotable. Small amount of creamy white discharge. No blood or pooling of fluid. Cervix visually closed.   Extremities: Normal range of motion.  Edema: Trace  Mental Status: Normal mood and affect. Normal behavior. Normal judgment and thought content.   Assessment and Plan:  Pregnancy: G2P0101 at 8078w6d  1. Hematuria, unspecified type  - Culture, OB  Urine  2. Nausea/vomiting in pregnancy  - ondansetron (ZOFRAN) 4 MG tablet; Take 2 tablets (8 mg total) by mouth every 8 (eight) hours as needed for nausea or vomiting.  Dispense: 20 tablet; Refill: 1  3. Preterm uterine contractions in third trimester, antepartum  - Fetal fibronectin  4. Vaginal irritation  - WET PREP FOR TRICH, YEAST, CLUE  Preterm labor symptoms and general obstetric precautions including but not limited to vaginal bleeding, contractions, leaking of fluid and fetal movement were reviewed in detail with the patient. Please refer to After Visit Summary for other counseling recommendations.  F/I in 2 days as scheduled if Sx continue. May cancel appt and schedule next ROB in 2 weeks.   Gabrielle Roy, CNM

## 2015-12-23 NOTE — Progress Notes (Signed)
Pt states that she had bleeding on Friday but it has stopped. She is feeling very sick. She has also experienced contractions.

## 2015-12-23 NOTE — Patient Instructions (Signed)

## 2015-12-24 ENCOUNTER — Telehealth: Payer: Self-pay | Admitting: *Deleted

## 2015-12-24 ENCOUNTER — Ambulatory Visit (HOSPITAL_COMMUNITY)
Admission: RE | Admit: 2015-12-24 | Discharge: 2015-12-24 | Disposition: A | Discharge status: Home / Self Care | Payer: Medicaid Other | Source: Ambulatory Visit | Attending: Obstetrics & Gynecology | Admitting: Obstetrics & Gynecology

## 2015-12-24 DIAGNOSIS — O34219 Maternal care for unspecified type scar from previous cesarean delivery: Secondary | ICD-10-CM | POA: Diagnosis not present

## 2015-12-24 DIAGNOSIS — N760 Acute vaginitis: Principal | ICD-10-CM

## 2015-12-24 DIAGNOSIS — O99210 Obesity complicating pregnancy, unspecified trimester: Secondary | ICD-10-CM

## 2015-12-24 DIAGNOSIS — B9689 Other specified bacterial agents as the cause of diseases classified elsewhere: Secondary | ICD-10-CM

## 2015-12-24 DIAGNOSIS — O09213 Supervision of pregnancy with history of pre-term labor, third trimester: Secondary | ICD-10-CM | POA: Diagnosis not present

## 2015-12-24 DIAGNOSIS — Z362 Encounter for other antenatal screening follow-up: Secondary | ICD-10-CM | POA: Diagnosis not present

## 2015-12-24 DIAGNOSIS — Z3A30 30 weeks gestation of pregnancy: Secondary | ICD-10-CM | POA: Diagnosis not present

## 2015-12-24 DIAGNOSIS — Z3492 Encounter for supervision of normal pregnancy, unspecified, second trimester: Secondary | ICD-10-CM

## 2015-12-24 DIAGNOSIS — O99213 Obesity complicating pregnancy, third trimester: Secondary | ICD-10-CM | POA: Diagnosis not present

## 2015-12-24 LAB — WET PREP FOR TRICH, YEAST, CLUE
TRICH WET PREP: NONE SEEN
YEAST WET PREP: NONE SEEN

## 2015-12-24 MED ORDER — METRONIDAZOLE 500 MG PO TABS: 500.0000 mg | ORAL_TABLET | Freq: Two times a day (BID) | ORAL | 0 refills | Status: DC

## 2015-12-24 NOTE — Telephone Encounter (Signed)
-----   Message from [place], [place] sent at 12/24/2015  8:16 AM EDT ----- Please inform pt of neg fFN. Will Tx for BV due to pt's contractions. Rx Flagyl 500 mg PO BID x 7 days #14, no RF.

## 2015-12-24 NOTE — Telephone Encounter (Signed)
Pt notified of neg FFN and postitive for BV and Flagyl was sent to [facility] per Ivonne AndrewV Smith, CNM

## 2015-12-25 ENCOUNTER — Ambulatory Visit (INDEPENDENT_AMBULATORY_CARE_PROVIDER_SITE_OTHER): Payer: Medicaid Other | Admitting: Obstetrics & Gynecology

## 2015-12-25 VITALS — BP 136/87 | HR 107 | Wt 288.0 lb

## 2015-12-25 DIAGNOSIS — O99213 Obesity complicating pregnancy, third trimester: Secondary | ICD-10-CM

## 2015-12-25 DIAGNOSIS — O99210 Obesity complicating pregnancy, unspecified trimester: Secondary | ICD-10-CM

## 2015-12-25 DIAGNOSIS — E669 Obesity, unspecified: Secondary | ICD-10-CM

## 2015-12-25 LAB — CULTURE, OB URINE: Organism ID, Bacteria: NO GROWTH

## 2015-12-25 NOTE — Progress Notes (Signed)
Pt states that she began Flagyl yesterday and she is having a reaction to it and would like to talk to the dr. about it    PRENATAL VISIT NOTE  Subjective:  Gabrielle Roy is a 25 y.o. G2P0101 at 3743w1d being seen today for ongoing prenatal care.  She is currently monitored for the following issues for this high-risk pregnancy and has Supervision of normal pregnancy, antepartum; History of cesarean delivery, antepartum; Anxiety in pregnancy, antepartum; Low lying placenta, antepartum; Maternal obesity, antepartum; and Depressive disorder, not elsewhere classified on her problem list.  Patient reports facial flushing from Flagyl.  Contractions: Irregular. Vag. Bleeding: None.  Movement: Present. Denies leaking of fluid.   The following portions of the patient's history were reviewed and updated as appropriate: allergies, current medications, past family history, past medical history, past social history, past surgical history and problem list. Problem list updated.  Objective:   Vitals:   12/25/15 1333  BP: 136/87  Pulse: (!) 107  Weight: 288 lb (130.6 kg)    Fetal Status: Fetal Heart Rate (bpm): 135   Movement: Present     General:  Alert, oriented and cooperative. Patient is in no acute distress.  Skin: Skin is warm and dry. No rash noted.   Cardiovascular: Normal heart rate noted  Respiratory: Normal respiratory effort, no problems with respiration noted  Abdomen: Soft, gravid, appropriate for gestational age. Pain/Pressure: Present     Pelvic:  Cervical exam deferred        Extremities: Normal range of motion.  Edema: Trace  Mental Status: Normal mood and affect. Normal behavior. Normal judgment and thought content.   Assessment and Plan:  Pregnancy: G2P0101 at 4043w1d  1. Maternal obesity affecting pregnancy, antepartum -history of IUGR - US MFM OB FOLLOW UP; Future - US MFM OB FOLLOW UP; Future  2.  BV--?allergic reaction to Flagyl -pt will stop Flagyl for 5 days and see if  flushing resolves.  She will restart Flagyl after 5 days.  If flusing returns, will consider Flagyl as an allergy.  Can recheck for BV if still symptomatic after treatment.  Preterm labor symptoms and general obstetric precautions including but not limited to vaginal bleeding, contractions, leaking of fluid and fetal movement were reviewed in detail with the patient. Please refer to After Visit Summary for other counseling recommendations.  Return in 2 weeks (on 01/08/2016).  Lesly DukesKelly H Shonya Sumida, MD

## 2016-01-08 ENCOUNTER — Ambulatory Visit (INDEPENDENT_AMBULATORY_CARE_PROVIDER_SITE_OTHER): Payer: Medicaid Other | Admitting: Obstetrics & Gynecology

## 2016-01-08 ENCOUNTER — Encounter: Payer: Self-pay | Admitting: Obstetrics & Gynecology

## 2016-01-08 DIAGNOSIS — N760 Acute vaginitis: Secondary | ICD-10-CM

## 2016-01-08 DIAGNOSIS — Z3480 Encounter for supervision of other normal pregnancy, unspecified trimester: Secondary | ICD-10-CM

## 2016-01-08 DIAGNOSIS — O23593 Infection of other part of genital tract in pregnancy, third trimester: Secondary | ICD-10-CM

## 2016-01-08 DIAGNOSIS — Z3483 Encounter for supervision of other normal pregnancy, third trimester: Secondary | ICD-10-CM

## 2016-01-08 DIAGNOSIS — B9689 Other specified bacterial agents as the cause of diseases classified elsewhere: Secondary | ICD-10-CM

## 2016-01-08 NOTE — Patient Instructions (Signed)
Contraception Choices Contraception (birth control) is the use of any methods or devices to prevent pregnancy. Below are some methods to help avoid pregnancy. HORMONAL METHODS   Contraceptive implant. This is a thin, plastic tube containing progesterone hormone. It does not contain estrogen hormone. Your health care provider inserts the tube in the inner part of the upper arm. The tube can remain in place for up to 3 years. After 3 years, the implant must be removed. The implant prevents the ovaries from releasing an egg (ovulation), thickens the cervical mucus to prevent sperm from entering the uterus, and thins the lining of the inside of the uterus.  Progesterone-only injections. These injections are given every 3 months by your health care provider to prevent pregnancy. This synthetic progesterone hormone stops the ovaries from releasing eggs. It also thickens cervical mucus and changes the uterine lining. This makes it harder for sperm to survive in the uterus.  Birth control pills. These pills contain estrogen and progesterone hormone. They work by preventing the ovaries from releasing eggs (ovulation). They also cause the cervical mucus to thicken, preventing the sperm from entering the uterus. Birth control pills are prescribed by a health care provider.Birth control pills can also be used to treat heavy periods.  Minipill. This type of birth control pill contains only the progesterone hormone. They are taken every day of each month and must be prescribed by your health care provider.  Birth control patch. The patch contains hormones similar to those in birth control pills. It must be changed once a week and is prescribed by a health care provider.  Vaginal ring. The ring contains hormones similar to those in birth control pills. It is left in the vagina for 3 weeks, removed for 1 week, and then a new one is put back in place. The patient must be comfortable inserting and removing the ring  from the vagina.A health care provider's prescription is necessary.  Emergency contraception. Emergency contraceptives prevent pregnancy after unprotected sexual intercourse. This pill can be taken right after sex or up to 5 days after unprotected sex. It is most effective the sooner you take the pills after having sexual intercourse. Most emergency contraceptive pills are available without a prescription. Check with your pharmacist. Do not use emergency contraception as your only form of birth control. BARRIER METHODS   Female condom. This is a thin sheath (latex or rubber) that is worn over the penis during sexual intercourse. It can be used with spermicide to increase effectiveness.  Female condom. This is a soft, loose-fitting sheath that is put into the vagina before sexual intercourse.  Diaphragm. This is a soft, latex, dome-shaped barrier that must be fitted by a health care provider. It is inserted into the vagina, along with a spermicidal jelly. It is inserted before intercourse. The diaphragm should be left in the vagina for 6 to 8 hours after intercourse.  Cervical cap. This is a round, soft, latex or plastic cup that fits over the cervix and must be fitted by a health care provider. The cap can be left in place for up to 48 hours after intercourse.  Sponge. This is a soft, circular piece of polyurethane foam. The sponge has spermicide in it. It is inserted into the vagina after wetting it and before sexual intercourse.  Spermicides. These are chemicals that kill or block sperm from entering the cervix and uterus. They come in the form of creams, jellies, suppositories, foam, or tablets. They do not require a   prescription. They are inserted into the vagina with an applicator before having sexual intercourse. The process must be repeated every time you have sexual intercourse. INTRAUTERINE CONTRACEPTION  Intrauterine device (IUD). This is a T-shaped device that is put in a woman's uterus  during a menstrual period to prevent pregnancy. There are 2 types:  Copper IUD. This type of IUD is wrapped in copper wire and is placed inside the uterus. Copper makes the uterus and fallopian tubes produce a fluid that kills sperm. It can stay in place for 10 years.  Hormone IUD. This type of IUD contains the hormone progestin (synthetic progesterone). The hormone thickens the cervical mucus and prevents sperm from entering the uterus, and it also thins the uterine lining to prevent implantation of a fertilized egg. The hormone can weaken or kill the sperm that get into the uterus. It can stay in place for 3-5 years, depending on which type of IUD is used. PERMANENT METHODS OF CONTRACEPTION  Female tubal ligation. This is when the woman's fallopian tubes are surgically sealed, tied, or blocked to prevent the egg from traveling to the uterus.  Hysteroscopic sterilization. This involves placing a small coil or insert into each fallopian tube. Your doctor uses a technique called hysteroscopy to do the procedure. The device causes scar tissue to form. This results in permanent blockage of the fallopian tubes, so the sperm cannot fertilize the egg. It takes about 3 months after the procedure for the tubes to become blocked. You must use another form of birth control for these 3 months.  Female sterilization. This is when the female has the tubes that carry sperm tied off (vasectomy).This blocks sperm from entering the vagina during sexual intercourse. After the procedure, the man can still ejaculate fluid (semen). NATURAL PLANNING METHODS  Natural family planning. This is not having sexual intercourse or using a barrier method (condom, diaphragm, cervical cap) on days the woman could become pregnant.  Calendar method. This is keeping track of the length of each menstrual cycle and identifying when you are fertile.  Ovulation method. This is avoiding sexual intercourse during ovulation.  Symptothermal  method. This is avoiding sexual intercourse during ovulation, using a thermometer and ovulation symptoms.  Post-ovulation method. This is timing sexual intercourse after you have ovulated. Regardless of which type or method of contraception you choose, it is important that you use condoms to protect against the transmission of sexually transmitted infections (STIs). Talk with your health care provider about which form of contraception is most appropriate for you.   This information is not intended to replace advice given to you by your health care provider. Make sure you discuss any questions you have with your health care provider.   Document Released: 02/23/2005 Document Revised: 02/28/2013 Document Reviewed: 08/18/2012 Elsevier Interactive Patient Education 2016 [employer]  

## 2016-01-08 NOTE — Progress Notes (Signed)
   PRENATAL VISIT NOTE  Subjective:  Gabrielle Roy is a 25 y.o. G2P0101 at 2377w1d being seen today for ongoing prenatal care.  She is currently monitored for the following issues for this high-risk pregnancy and has Supervision of normal pregnancy, antepartum; History of cesarean delivery, antepartum; Anxiety in pregnancy, antepartum; Low lying placenta, antepartum; Maternal obesity, antepartum; and Depressive disorder, not elsewhere classified on her problem list.  Patient reports reproducible pain over pubic symphysis.  Contractions: Irritability. Vag. Bleeding: None.  Movement: Present. Denies leaking of fluid.   The following portions of the patient's history were reviewed and updated as appropriate: allergies, current medications, past family history, past medical history, past social history, past surgical history and problem list. Problem list updated.  Objective:   Vitals:   01/08/16 0935  BP: 107/71  Pulse: 93  Weight: 291 lb (132 kg)    Fetal Status: Fetal Heart Rate (bpm): 141   Movement: Present     General:  Alert, oriented and cooperative. Patient is in no acute distress.  Skin: Skin is warm and dry. No rash noted.   Cardiovascular: Normal heart rate noted  Respiratory: Normal respiratory effort, no problems with respiration noted  Abdomen: Soft, gravid, appropriate for gestational age. Pain/Pressure: Present     Pelvic:  Cervical exam deferred        Extremities: Normal range of motion.  Edema: Trace  Mental Status: Normal mood and affect. Normal behavior. Normal judgment and thought content.   Assessment and Plan:  Pregnancy: G2P0101 at 1177w1d  1. Supervision of other normal pregnancy, antepartum Obesity and borderline AGA--US scheduled--Unable to follow fundal height due to habitus Passed 3 hr  2.  BV / Flagyl allergy Pt stopped Flagyl and then restarted.  Facial flushing returned. No vaginal itching or burning. Nml discharge per patient  Preterm labor  symptoms and general obstetric precautions including but not limited to vaginal bleeding, contractions, leaking of fluid and fetal movement were reviewed in detail with the patient. Please refer to After Visit Summary for other counseling recommendations.  Return in 2 weeks (on 01/22/2016).  Lesly DukesKelly H Dany Harten, MD

## 2016-01-22 ENCOUNTER — Encounter: Payer: Self-pay | Admitting: Obstetrics & Gynecology

## 2016-01-22 ENCOUNTER — Ambulatory Visit (INDEPENDENT_AMBULATORY_CARE_PROVIDER_SITE_OTHER): Payer: Medicaid Other | Admitting: Obstetrics & Gynecology

## 2016-01-22 DIAGNOSIS — O9989 Other specified diseases and conditions complicating pregnancy, childbirth and the puerperium: Secondary | ICD-10-CM

## 2016-01-22 DIAGNOSIS — R102 Pelvic and perineal pain: Secondary | ICD-10-CM

## 2016-01-22 MED ORDER — SULFAMETHOXAZOLE-TRIMETHOPRIM 800-160 MG PO TABS: 2.0000 | ORAL_TABLET | Freq: Two times a day (BID) | ORAL | 0 refills | Status: DC

## 2016-01-22 NOTE — Progress Notes (Signed)
   PRENATAL VISIT NOTE  Subjective:  Gabrielle Roy is a 25 y.o. G2P0101 at 331w1d being seen today for ongoing prenatal care.  She is currently monitored for the following issues for this high-risk pregnancy and has Supervision of normal pregnancy, antepartum; History of cesarean delivery, antepartum; Anxiety in pregnancy, antepartum; Low lying placenta, antepartum; Maternal obesity, antepartum; and Depressive disorder, not elsewhere classified on her problem list.  Patient reports pubic symphysis pain (wants to try PT) and swelling and pain over left eye at eye brown.  Contractions: Irritability. Vag. Bleeding: None.  Movement: Present. Denies leaking of fluid.   The following portions of the patient's history were reviewed and updated as appropriate: allergies, current medications, past family history, past medical history, past social history, past surgical history and problem list. Problem list updated.  Objective:   Vitals:   01/22/16 0942  BP: 128/78  Pulse: 93  Weight: 290 lb (131.5 kg)    Fetal Status: Fetal Heart Rate (bpm): 142 Fundal Height: 40 cm Movement: Present     General:  Alert, oriented and cooperative. Patient is in no acute distress.  Skin: Skin is warm and dry. No rash noted.   Cardiovascular: Normal heart rate noted  Respiratory: Normal respiratory effort, no problems with respiration noted  Abdomen: Soft, gravid, appropriate for gestational age. Pain/Pressure: Present     Pelvic:  Cervical exam deferred        Extremities: Normal range of motion.  Edema: Trace  Mental Status: Normal mood and affect. Normal behavior. Normal judgment and thought content.   Assessment and Plan:  Pregnancy: G2P0101 at 1631w1d  1. Pubic symphysis separation - Ambulatory referral to Physical Therapy  2.  Cellulitis in left eyebrow -Bacrrim DS  -if not better in 24 hours, will refer to primary care or urgent care  3.  Pt elects for rpt c/s now given pain with pubic symphysis and  large baby.  Preterm labor symptoms and general obstetric precautions including but not limited to vaginal bleeding, contractions, leaking of fluid and fetal movement were reviewed in detail with the patient. Please refer to After Visit Summary for other counseling recommendations.  Return in 2 weeks (on 02/05/2016).   Lesly DukesKelly H Jazia Faraci, MD

## 2016-01-23 ENCOUNTER — Encounter (HOSPITAL_COMMUNITY): Payer: Self-pay | Admitting: *Deleted

## 2016-01-25 ENCOUNTER — Inpatient Hospital Stay (HOSPITAL_COMMUNITY)
Admission: AD | Admit: 2016-01-25 | Discharge: 2016-01-25 | Disposition: A | Discharge status: Home / Self Care | Payer: Medicaid Other | Source: Ambulatory Visit | Attending: Obstetrics & Gynecology | Admitting: Obstetrics & Gynecology

## 2016-01-25 ENCOUNTER — Encounter (HOSPITAL_COMMUNITY): Payer: Self-pay | Admitting: *Deleted

## 2016-01-25 DIAGNOSIS — O99210 Obesity complicating pregnancy, unspecified trimester: Secondary | ICD-10-CM

## 2016-01-25 DIAGNOSIS — Z8249 Family history of ischemic heart disease and other diseases of the circulatory system: Secondary | ICD-10-CM | POA: Diagnosis not present

## 2016-01-25 DIAGNOSIS — Z3A34 34 weeks gestation of pregnancy: Secondary | ICD-10-CM | POA: Diagnosis not present

## 2016-01-25 DIAGNOSIS — O99713 Diseases of the skin and subcutaneous tissue complicating pregnancy, third trimester: Secondary | ICD-10-CM | POA: Diagnosis not present

## 2016-01-25 DIAGNOSIS — L03211 Cellulitis of face: Secondary | ICD-10-CM | POA: Diagnosis not present

## 2016-01-25 DIAGNOSIS — O34219 Maternal care for unspecified type scar from previous cesarean delivery: Secondary | ICD-10-CM

## 2016-01-25 DIAGNOSIS — O4440 Low lying placenta NOS or without hemorrhage, unspecified trimester: Secondary | ICD-10-CM

## 2016-01-25 DIAGNOSIS — L0201 Cutaneous abscess of face: Secondary | ICD-10-CM | POA: Insufficient documentation

## 2016-01-25 DIAGNOSIS — Z885 Allergy status to narcotic agent status: Secondary | ICD-10-CM | POA: Diagnosis not present

## 2016-01-25 DIAGNOSIS — F419 Anxiety disorder, unspecified: Secondary | ICD-10-CM

## 2016-01-25 DIAGNOSIS — Z87891 Personal history of nicotine dependence: Secondary | ICD-10-CM | POA: Diagnosis not present

## 2016-01-25 DIAGNOSIS — Z888 Allergy status to other drugs, medicaments and biological substances status: Secondary | ICD-10-CM | POA: Insufficient documentation

## 2016-01-25 DIAGNOSIS — L729 Follicular cyst of the skin and subcutaneous tissue, unspecified: Secondary | ICD-10-CM | POA: Diagnosis present

## 2016-01-25 DIAGNOSIS — O99340 Other mental disorders complicating pregnancy, unspecified trimester: Secondary | ICD-10-CM

## 2016-01-25 DIAGNOSIS — Z3480 Encounter for supervision of other normal pregnancy, unspecified trimester: Secondary | ICD-10-CM

## 2016-01-25 NOTE — MAU Provider Note (Signed)
History     CSN: [id number]  Arrival date and time: 01/25/16 1133   First Provider Initiated Contact with Patient 01/25/16 1209      Chief Complaint  Patient presents with  . Cyst   Is a 25 year old G2 P1 at 34 weeks and 4 days presents with concern for facial cellulitis abscess. She was seen in the [place] office on Thursday and started on Bactrim. She reports that since then it has not been red and she has had no fevers but she did develop some hardness in the area that was concerning to her. She has 3 more days of Bactrim. She reports regular fetal movement, no vaginal bleeding or discharge and no regular contractions.    OB History    Gravida Para Term Preterm AB Living   2 1   1   1    SAB TAB Ectopic Multiple Live Births           1      Past Medical History:  Diagnosis Date  . Kidney stone    Stent placed in Feb 2017  . Supervision of normal pregnancy, antepartum 10/04/2015    Clinic  [place] - transfer from Gabrielle Roy Prenatal Labs Dating  Blood type:    Genetic Screen 1 Screen:    AFP:     Quad:     NIPS: Antibody:  Anatomic US  Nml fetal anatomy @ 18 wks; low lying placenta > resolved @23  wks Rubella:   GTT Early:               Third trimester:  RPR:    Flu vaccine  Declined HBsAg:    TDaP vaccine                                               Rhogam: HIV:    Baby Food  Breastfeed                                            GBS: (For PCN allergy, check sensitivities) Contraception  Pap: Circumcision  n/a female ([facility] ultrasound)  Pediatrician   Support Person  Gabrielle Roy (husband)      Past Surgical History:  Procedure Laterality Date  . CESAREAN SECTION    . FINGER SURGERY    . kidney stint      Family History  Problem Relation Age of Onset  . Hypertension Father   . Heart disease Father     Social History  Substance Use Topics  . Smoking status: Former Games developermoker  . Smokeless tobacco: Never Used  . Alcohol use No    Allergies:  Allergies  Allergen  Reactions  . Alcohol Hives  . Nyquil Multi-Symptom [Pseudoeph-Doxylamine-Dm-Apap] Hives    Other reaction(s): Chest Pain Hives   . Flagyl [Metronidazole] Other (See Comments)    Facial flushing  . Hydrocodone-Acetaminophen Nausea And Vomiting    Prescriptions Prior to Admission  Medication Sig Dispense Refill Last Dose  . butalbital-acetaminophen-caffeine (FIORICET) 50-325-40 MG tablet Take 1-2 tablets by mouth every 6 (six) hours as needed for headache. (Patient not taking: Reported on 01/22/2016) 20 tablet 1 Not Taking  . metroNIDAZOLE (FLAGYL) 500 MG tablet Take 1 tablet (500 mg total) by mouth 2 (two) times daily. (  Patient not taking: Reported on 01/22/2016) 14 tablet 0 Not Taking  . Prenatal Vit-Fe Fumarate-FA (PRENATAL MULTIVITAMIN) TABS tablet Take 1 tablet by mouth daily at 12 noon.   Not Taking  . sulfamethoxazole-trimethoprim (BACTRIM DS) 800-160 MG tablet Take 2 tablets by mouth 2 (two) times daily. 28 tablet 0     Review of Systems  Constitutional: Negative for chills, fever and weight loss.  HENT: Negative for ear discharge and hearing loss.   Eyes: Negative for blurred vision and double vision.  Respiratory: Negative for cough and shortness of breath.   Cardiovascular: Negative for chest pain and palpitations.  Gastrointestinal: Negative for abdominal pain, heartburn, nausea and vomiting.  Genitourinary: Negative for dysuria, frequency and urgency.  Musculoskeletal: Negative for back pain, myalgias and neck pain.  Skin: Negative for itching and rash.  Neurological: Negative for dizziness, tingling, tremors and headaches.  Endo/Heme/Allergies: Negative for environmental allergies. Does not bruise/bleed easily.  Psychiatric/Behavioral: Negative for depression and suicidal ideas.   Physical Exam   Blood pressure 127/75, pulse 109, temperature 98.1 F (36.7 C), resp. rate 18, height 5\' 3"  (1.6 m), weight 291 lb 4.8 oz (132.1 kg).  Physical Exam  Constitutional: She is  oriented to person, place, and time. She appears well-developed and well-nourished.  HENT:  Head: Normocephalic and atraumatic.  Cardiovascular: Normal rate and intact distal pulses.   Respiratory: Effort normal. No respiratory distress.  GI: Soft. Bowel sounds are normal. She exhibits no distension. There is no tenderness.  Neurological: She is alert and oriented to person, place, and time.  Skin:  Approximately 1 cm x 1 cm area of induration over the right brow. Minimally tender to palpation. No overlying erythema  Psychiatric: She has a normal mood and affect. Her behavior is normal.    MAU Course  Procedures  MDM In MAU patient was evaluated for worsening facial cellulitis. Gabrielle Roy who initially so the area on Thursday was in-house she evaluated the patient states is about the same or better but definitely no worse. Patient does have 3 more days of antibiotics. Will continue to monitor.  Assessment and Plan  #1: Facial cellulitis/abscess. Patient improving on Bactrim. If worsening develops fevers worsening pain or overlying erythema she [facility].  Gabrielle Roy 01/25/2016, 12:10 PM

## 2016-01-25 NOTE — MAU Note (Signed)
Bump on eye since Tuesday this past week because eye was swollen shut .  Seen at ob office in [place] and given an antibiotic x 7 days.  Told if things did not get better to go to urgent care.  Went to urgent care today but they [facility]  Pain with touch 5/10.  Denies itching, oozing, increased reddness, or swelling. No fevers today

## 2016-01-25 NOTE — Progress Notes (Signed)
Dr Genevie AnnSchenk notified of pt arrival and complaint

## 2016-01-25 NOTE — Discharge Instructions (Signed)
Cellulitis, Adult °Introduction °Cellulitis is a skin infection. The infected area is usually red and sore. This condition occurs most often in the arms and lower legs. It is very important to get treated for this condition. °Follow these instructions at home: °· Take over-the-counter and prescription medicines only as told by your doctor. °· If you were prescribed an antibiotic medicine, take it as told by your doctor. Do not stop taking the antibiotic even if you start to feel better. °· Drink enough fluid to keep your pee (urine) clear or pale yellow. °· Do not touch or rub the infected area. °· Raise (elevate) the infected area above the level of your heart while you are sitting or lying down. °· Place warm or cold wet cloths (warm or cold compresses) on the infected area. Do this as told by your doctor. °· Keep all follow-up visits as told by your doctor. This is important. These visits let your doctor make sure your infection is not getting worse. °Contact a doctor if: °· You have a fever. °· Your symptoms do not get better after 1-2 days of treatment. °· Your bone or joint under the infected area starts to hurt after the skin has healed. °· Your infection comes back. This can happen in the same area or another area. °· You have a swollen bump in the infected area. °· You have new symptoms. °· You feel ill and also have muscle aches and pains. °Get help right away if: °· Your symptoms get worse. °· You feel very sleepy. °· You throw up (vomit) or have watery poop (diarrhea) for a long time. °· There are red streaks coming from the infected area. °· Your red area gets larger. °· Your red area turns darker. °This information is not intended to replace advice given to you by your health care provider. Make sure you discuss any questions you have with your health care provider. °Document Released: 08/12/2007 Document Revised: 08/01/2015 Document Reviewed: 01/02/2015 °© 2017 Elsevier ° °

## 2016-01-25 NOTE — MAU Note (Signed)
Urine sent to lab 

## 2016-01-29 ENCOUNTER — Ambulatory Visit (HOSPITAL_COMMUNITY)
Admission: RE | Admit: 2016-01-29 | Discharge: 2016-01-29 | Disposition: A | Discharge status: Home / Self Care | Payer: Medicaid Other | Source: Ambulatory Visit | Attending: Obstetrics & Gynecology | Admitting: Obstetrics & Gynecology

## 2016-01-29 DIAGNOSIS — O99213 Obesity complicating pregnancy, third trimester: Secondary | ICD-10-CM | POA: Insufficient documentation

## 2016-01-29 DIAGNOSIS — Z3A35 35 weeks gestation of pregnancy: Secondary | ICD-10-CM | POA: Diagnosis not present

## 2016-01-29 DIAGNOSIS — O99210 Obesity complicating pregnancy, unspecified trimester: Secondary | ICD-10-CM

## 2016-01-30 ENCOUNTER — Inpatient Hospital Stay (HOSPITAL_COMMUNITY)
Admission: AD | Admit: 2016-01-30 | Discharge: 2016-01-30 | Disposition: A | Discharge status: Home / Self Care | Payer: Medicaid Other | Source: Ambulatory Visit | Attending: Obstetrics and Gynecology | Admitting: Obstetrics and Gynecology

## 2016-01-30 ENCOUNTER — Encounter (HOSPITAL_COMMUNITY): Payer: Self-pay | Admitting: *Deleted

## 2016-01-30 DIAGNOSIS — Z888 Allergy status to other drugs, medicaments and biological substances status: Secondary | ICD-10-CM | POA: Insufficient documentation

## 2016-01-30 DIAGNOSIS — O23593 Infection of other part of genital tract in pregnancy, third trimester: Secondary | ICD-10-CM | POA: Diagnosis not present

## 2016-01-30 DIAGNOSIS — N898 Other specified noninflammatory disorders of vagina: Secondary | ICD-10-CM | POA: Diagnosis present

## 2016-01-30 DIAGNOSIS — F419 Anxiety disorder, unspecified: Secondary | ICD-10-CM

## 2016-01-30 DIAGNOSIS — B9689 Other specified bacterial agents as the cause of diseases classified elsewhere: Secondary | ICD-10-CM | POA: Insufficient documentation

## 2016-01-30 DIAGNOSIS — Z8249 Family history of ischemic heart disease and other diseases of the circulatory system: Secondary | ICD-10-CM | POA: Diagnosis not present

## 2016-01-30 DIAGNOSIS — Z87891 Personal history of nicotine dependence: Secondary | ICD-10-CM | POA: Diagnosis not present

## 2016-01-30 DIAGNOSIS — N760 Acute vaginitis: Secondary | ICD-10-CM | POA: Insufficient documentation

## 2016-01-30 DIAGNOSIS — O4440 Low lying placenta NOS or without hemorrhage, unspecified trimester: Secondary | ICD-10-CM

## 2016-01-30 DIAGNOSIS — O34219 Maternal care for unspecified type scar from previous cesarean delivery: Secondary | ICD-10-CM

## 2016-01-30 DIAGNOSIS — O99210 Obesity complicating pregnancy, unspecified trimester: Secondary | ICD-10-CM

## 2016-01-30 DIAGNOSIS — O34211 Maternal care for low transverse scar from previous cesarean delivery: Secondary | ICD-10-CM | POA: Diagnosis not present

## 2016-01-30 DIAGNOSIS — Z885 Allergy status to narcotic agent status: Secondary | ICD-10-CM | POA: Insufficient documentation

## 2016-01-30 DIAGNOSIS — Z3A35 35 weeks gestation of pregnancy: Secondary | ICD-10-CM | POA: Diagnosis not present

## 2016-01-30 DIAGNOSIS — O99340 Other mental disorders complicating pregnancy, unspecified trimester: Secondary | ICD-10-CM

## 2016-01-30 DIAGNOSIS — O26893 Other specified pregnancy related conditions, third trimester: Secondary | ICD-10-CM

## 2016-01-30 LAB — URINALYSIS, ROUTINE W REFLEX MICROSCOPIC
BILIRUBIN URINE: NEGATIVE
Glucose, UA: 500 mg/dL — AB
KETONES UR: 40 mg/dL — AB
Leukocytes, UA: NEGATIVE
NITRITE: NEGATIVE
Protein, ur: NEGATIVE mg/dL
Specific Gravity, Urine: 1.025 (ref 1.005–1.030)
pH: 6 (ref 5.0–8.0)

## 2016-01-30 LAB — WET PREP, GENITAL
SPERM: NONE SEEN
TRICH WET PREP: NONE SEEN
YEAST WET PREP: NONE SEEN

## 2016-01-30 LAB — URINE MICROSCOPIC-ADD ON

## 2016-01-30 LAB — POCT FERN TEST: POCT Fern Test: NEGATIVE

## 2016-01-30 MED ORDER — CLINDAMYCIN HCL 300 MG PO CAPS: 300.0000 mg | ORAL_CAPSULE | Freq: Two times a day (BID) | ORAL | 0 refills | Status: DC

## 2016-01-30 MED ORDER — ONDANSETRON 4 MG PO TBDP
4.0000 mg | ORAL_TABLET | Freq: Once | ORAL | Status: AC
  Administered 2016-01-30: 4 mg via ORAL
  Filled 2016-01-30: qty 1

## 2016-01-30 NOTE — Discharge Instructions (Signed)

## 2016-01-30 NOTE — MAU Note (Signed)
Pt reports leaking fluid since 1930, also reports pressure. Scheduled repeat C/S

## 2016-01-30 NOTE — MAU Note (Signed)
Pt reports LOF since 0730. Pt reports that she saw the fluid on the floor and then it was running down her leg while she was getting ready to come to the hospital and when she was walking into the hospital. Pt reports irregular contractions. Pt reports pain 3/10. +FM

## 2016-01-30 NOTE — MAU Provider Note (Signed)
History     CSN: [id number]  Arrival date and time: 01/30/16 2022   First Provider Initiated Contact with Patient 01/30/16 2141      Chief Complaint  Patient presents with  . Rupture of Membranes   HPI  Patient is a 282P0101 25-y/o female at 7673w2d who presents with concern that her water broke. She felt some fluid come down her leg around 1930. She also had 1 strong contraction this evening. She has had some nausea and vomited x 1. She denies vaginal bleeding (though had bleeding earlier in pregnancy with low-lying placenta -- resolved). + FM. Endorses anxiety. Last had vaginal intercourse about 2 weeks ago. Patient had been on her feet most of the day helping with Thanksgiving cooking.   Pregnancy history is significant for LTCS at 35 weeks for last pregnancy due to amniotic band and oligohydramnios. She plans rLTCS due to pubic symphysis separation and large baby (87 % EFW at 30w).  Past Medical History:  Diagnosis Date  . Kidney stone    Stent placed in Feb 2017  . Supervision of normal pregnancy, antepartum 10/04/2015    Clinic  [place] - transfer from Dr. Shawnie Ponsorn Prenatal Labs Dating  Blood type:    Genetic Screen 1 Screen:    AFP:     Quad:     NIPS: Antibody:  Anatomic US  Nml fetal anatomy @ 18 wks; low lying placenta > resolved @23  wks Rubella:   GTT Early:               Third trimester:  RPR:    Flu vaccine  Declined HBsAg:    TDaP vaccine                                               Rhogam: HIV:    Baby Food  Breastfeed                                            GBS: (For PCN allergy, check sensitivities) Contraception  Pap: Circumcision  n/a female ([facility] ultrasound)  Pediatrician   Support Person  [name] (husband)      Past Surgical History:  Procedure Laterality Date  . CESAREAN SECTION    . FINGER SURGERY    . kidney stint      Family History  Problem Relation Age of Onset  . Hypertension Father   . Heart disease Father     Social History  Substance Use  Topics  . Smoking status: Former Games developermoker  . Smokeless tobacco: Never Used  . Alcohol use No    Allergies:  Allergies  Allergen Reactions  . Alcohol Hives  . Nyquil Multi-Symptom [Pseudoeph-Doxylamine-Dm-Apap] Hives    Other reaction(s): Chest Pain Hives   . Flagyl [Metronidazole] Other (See Comments)    Facial flushing  . Hydrocodone-Acetaminophen Nausea And Vomiting    Prescriptions Prior to Admission  Medication Sig Dispense Refill Last Dose  . Prenatal Vit-Fe Fumarate-FA (PRENATAL MULTIVITAMIN) TABS tablet Take 1 tablet by mouth daily at 12 noon.   Past Month at Unknown time  . butalbital-acetaminophen-caffeine (FIORICET) 50-325-40 MG tablet Take 1-2 tablets by mouth every 6 (six) hours as needed for headache. 20 tablet 1 Not Taking  . sulfamethoxazole-trimethoprim (BACTRIM DS)  800-160 MG tablet Take 2 tablets by mouth 2 (two) times daily. 28 tablet 0     Review of Systems  Constitutional: Negative for chills and fever.  Respiratory: Negative for shortness of breath.   Cardiovascular: Negative for chest pain.  Gastrointestinal: Positive for nausea.  Genitourinary: Negative for dysuria.  Musculoskeletal: Negative for myalgias.  Skin: Negative for rash.  Neurological: Negative for headaches.  Psychiatric/Behavioral: The patient is nervous/anxious.    Physical Exam   Blood pressure 115/79, pulse 110, temperature 99 F (37.2 C), temperature source Oral, resp. rate 19, height 5\' 3"  (1.6 m), weight 133.8 kg (295 lb), SpO2 98 %.  Physical Exam  Nursing note and vitals reviewed. Constitutional:  Obese female, in NAD  HENT:  Head: Normocephalic and atraumatic.  Cardiovascular: Normal rate and regular rhythm.   No murmur heard. Respiratory: Effort normal and breath sounds normal. She has no wheezes.  GI:  Gravid  Genitourinary:  Genitourinary Comments: Minimal amount of thick white discharge. Normal appearing cervix on speculum exam. Fingertip/thick/-3 on cervical exam.     MAU Course  Procedures  MDM  Reactive FHT. Rare contractions.  Speculum exam negative for pooling. Fern test negative.  Wet prep positive for clue cells.   Assessment and Plan  Wet prep positive for BV, which would explain increased discharge that patient interpreted as her water breaking. Prescribed clindamycin 300 mg BID x 7 days, as patient has allergy to flagyl. Reassured patient that her water had not broken and that she is not in labor.   Jamelle HaringHillary M Fitzgerald, MD [facility], PGY-2 01/30/2016, 9:41 PM   I have seen and examined this patient and agree with the management plan.

## 2016-02-01 ENCOUNTER — Encounter: Payer: Self-pay | Admitting: Obstetrics & Gynecology

## 2016-02-01 DIAGNOSIS — O3660X0 Maternal care for excessive fetal growth, unspecified trimester, not applicable or unspecified: Secondary | ICD-10-CM | POA: Insufficient documentation

## 2016-02-05 ENCOUNTER — Ambulatory Visit (INDEPENDENT_AMBULATORY_CARE_PROVIDER_SITE_OTHER): Payer: Medicaid Other | Admitting: Obstetrics & Gynecology

## 2016-02-05 ENCOUNTER — Other Ambulatory Visit (HOSPITAL_COMMUNITY)
Admission: RE | Admit: 2016-02-05 | Discharge: 2016-02-05 | Disposition: A | Discharge status: Home / Self Care | Payer: Medicaid Other | Source: Ambulatory Visit | Attending: Obstetrics & Gynecology | Admitting: Obstetrics & Gynecology

## 2016-02-05 VITALS — BP 137/83 | HR 93 | Wt 291.0 lb

## 2016-02-05 DIAGNOSIS — O99210 Obesity complicating pregnancy, unspecified trimester: Secondary | ICD-10-CM

## 2016-02-05 DIAGNOSIS — Z3400 Encounter for supervision of normal first pregnancy, unspecified trimester: Secondary | ICD-10-CM

## 2016-02-05 DIAGNOSIS — Z113 Encounter for screening for infections with a predominantly sexual mode of transmission: Secondary | ICD-10-CM | POA: Diagnosis not present

## 2016-02-05 DIAGNOSIS — O3663X0 Maternal care for excessive fetal growth, third trimester, not applicable or unspecified: Secondary | ICD-10-CM

## 2016-02-05 DIAGNOSIS — O34219 Maternal care for unspecified type scar from previous cesarean delivery: Secondary | ICD-10-CM

## 2016-02-05 DIAGNOSIS — E669 Obesity, unspecified: Secondary | ICD-10-CM

## 2016-02-05 DIAGNOSIS — O99213 Obesity complicating pregnancy, third trimester: Secondary | ICD-10-CM

## 2016-02-05 DIAGNOSIS — O3660X0 Maternal care for excessive fetal growth, unspecified trimester, not applicable or unspecified: Secondary | ICD-10-CM

## 2016-02-05 NOTE — Progress Notes (Signed)
   PRENATAL VISIT NOTE  Subjective:  Gabrielle Roy is a 25 y.o. G2P0101 at 6024w1d being seen today for ongoing prenatal care.  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  Patient reports decreased FM over the last week.  Contractions: Irritability. Vag. Bleeding: None.  Movement: Present. Denies leaking of fluid.   The following portions of the patient's history were reviewed and updated as appropriate: allergies, current medications, past family history, past medical history, past social history, past surgical history and problem list. Problem list updated.  Objective:   Vitals:   02/05/16 1045  BP: 137/83  Pulse: 93  Weight: 291 lb (132 kg)    Fetal Status:     Movement: Present     General:  Alert, oriented and cooperative. Patient is in no acute distress.  Skin: Skin is warm and dry. No rash noted.   Cardiovascular: Normal heart rate noted  Respiratory: Normal respiratory effort, no problems with respiration noted  Abdomen: Soft, gravid, appropriate for gestational age. Pain/Pressure: Present     Pelvic:  Cervical exam performed        Extremities: Normal range of motion.  Edema: Trace  Mental Status: Normal mood and affect. Normal behavior. Normal judgment and thought content.   Assessment and Plan:  Pregnancy: G2P0101 at 4824w1d  1. Excessive fetal growth affecting management of pregnancy, antepartum, single or unspecified fetus  - POCT Glucose (CBG) - Culture, beta strep (group b only) - Urine cytology ancillary only  2. History of cesarean delivery, antepartum - She plans for repeat at 39 weeks   3. Maternal  obesity, antepartum   4. Supervision of normal first pregnancy, antepartum   Preterm labor symptoms and general obstetric precautions including but not limited to vaginal bleeding, contractions, leaking of fluid and fetal movement were reviewed in detail with the patient. Please refer to After Visit Summary for other counseling recommendations.  No Follow-up on file.   Allie BossierMyra C Mory Herrman, MD

## 2016-02-06 LAB — URINE CYTOLOGY ANCILLARY ONLY
Chlamydia: NEGATIVE
Neisseria Gonorrhea: NEGATIVE

## 2016-02-07 LAB — CULTURE, BETA STREP (GROUP B ONLY)

## 2016-02-12 ENCOUNTER — Ambulatory Visit (INDEPENDENT_AMBULATORY_CARE_PROVIDER_SITE_OTHER): Payer: Medicaid Other | Admitting: Obstetrics & Gynecology

## 2016-02-12 VITALS — BP 139/74 | HR 96 | Wt 294.0 lb

## 2016-02-12 DIAGNOSIS — O99213 Obesity complicating pregnancy, third trimester: Secondary | ICD-10-CM

## 2016-02-12 DIAGNOSIS — O99210 Obesity complicating pregnancy, unspecified trimester: Secondary | ICD-10-CM

## 2016-02-12 DIAGNOSIS — O3663X0 Maternal care for excessive fetal growth, third trimester, not applicable or unspecified: Secondary | ICD-10-CM

## 2016-02-12 DIAGNOSIS — Z3483 Encounter for supervision of other normal pregnancy, third trimester: Secondary | ICD-10-CM

## 2016-02-12 DIAGNOSIS — E669 Obesity, unspecified: Secondary | ICD-10-CM

## 2016-02-12 DIAGNOSIS — Z3480 Encounter for supervision of other normal pregnancy, unspecified trimester: Secondary | ICD-10-CM

## 2016-02-12 LAB — GLUCOSE, POCT (MANUAL RESULT ENTRY): POC GLUCOSE: 79 mg/dL (ref 70–99)

## 2016-02-12 NOTE — Progress Notes (Signed)
CBG 75 (fasting)    PRENATAL VISIT NOTE  Subjective:  Gabrielle Roy is a 25 y.o. G2P0101 at 3561w1d being seen today for ongoing prenatal care.  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  Patient reports still having pelvic pressure..  Contractions: Irritability. Vag. Bleeding: None.  Movement: (!) Decreased. Denies leaking of fluid.   The following portions of the patient's history were reviewed and updated as appropriate: allergies, current medications, past family history, past medical history, past social history, past surgical history and problem list. Problem list updated.  Objective:   Vitals:   02/12/16 1028  BP: 139/74  Pulse: 96  Weight: 294 lb (133.4 kg)    Fetal Status:   Fundal Height: 44 cm Movement: (!) Decreased     General:  Alert, oriented and cooperative. Patient is in no acute distress.  Skin: Skin is warm and dry. No rash noted.   Cardiovascular: Normal heart rate noted  Respiratory: Normal respiratory effort, no problems with respiration noted  Abdomen: Soft, gravid, appropriate for gestational age. Pain/Pressure: Present     Pelvic:  Cervical exam deferred        Extremities: Normal range of motion.  Edema: Trace  Mental Status: Normal mood and affect. Normal behavior. Normal judgment and thought content.   Assessment and Plan:  Pregnancy: G2P0101 at 6661w1d  1. Supervision of other normal pregnancy, antepartum -3 lb weight gain and BP borderline. -RTC tomorrow for BP check and full NST (only 15 minutes today continuous tracing) -Baby is still moving well, movements are not as big  2. Maternal  obesity, antepartum -[place] at c/s  3. Excessive fetal growth affecting management of pregnancy in third trimester, single or unspecified fetus -Rpt c/s scheduled.  Fasating CBG is 75 today.  No GDM.  Term labor symptoms and general obstetric precautions including but not limited to vaginal bleeding, contractions, leaking of fluid and fetal movement were reviewed in detail with the patient. Please refer to After Visit Summary for other counseling recommendations.   RTC tomorrow.  Lesly DukesKelly H Janashia Parco, MD

## 2016-02-12 NOTE — Addendum Note (Signed)
Addended by: Granville LewisLARK, Aeralyn Barna L on: 02/12/2016 12:20 PM   Modules accepted: Orders

## 2016-02-13 ENCOUNTER — Ambulatory Visit (INDEPENDENT_AMBULATORY_CARE_PROVIDER_SITE_OTHER): Payer: Medicaid Other | Admitting: *Deleted

## 2016-02-13 VITALS — BP 140/75 | HR 104

## 2016-02-13 DIAGNOSIS — O3663X0 Maternal care for excessive fetal growth, third trimester, not applicable or unspecified: Secondary | ICD-10-CM

## 2016-02-13 DIAGNOSIS — O99210 Obesity complicating pregnancy, unspecified trimester: Secondary | ICD-10-CM

## 2016-02-13 NOTE — Progress Notes (Signed)
BP check and NST per Dr Penne LashLeggett.  NST reactive

## 2016-02-15 ENCOUNTER — Inpatient Hospital Stay (HOSPITAL_COMMUNITY)
Admission: AD | Admit: 2016-02-15 | Discharge: 2016-02-15 | Disposition: A | Discharge status: Home / Self Care | Payer: Medicaid Other | Source: Ambulatory Visit | Attending: Obstetrics & Gynecology | Admitting: Obstetrics & Gynecology

## 2016-02-15 ENCOUNTER — Encounter (HOSPITAL_COMMUNITY): Payer: Self-pay

## 2016-02-15 DIAGNOSIS — O212 Late vomiting of pregnancy: Secondary | ICD-10-CM | POA: Diagnosis not present

## 2016-02-15 DIAGNOSIS — Z888 Allergy status to other drugs, medicaments and biological substances status: Secondary | ICD-10-CM | POA: Insufficient documentation

## 2016-02-15 DIAGNOSIS — Z3A37 37 weeks gestation of pregnancy: Secondary | ICD-10-CM | POA: Insufficient documentation

## 2016-02-15 DIAGNOSIS — Z87891 Personal history of nicotine dependence: Secondary | ICD-10-CM | POA: Diagnosis not present

## 2016-02-15 DIAGNOSIS — R509 Fever, unspecified: Secondary | ICD-10-CM | POA: Diagnosis present

## 2016-02-15 DIAGNOSIS — O34211 Maternal care for low transverse scar from previous cesarean delivery: Secondary | ICD-10-CM | POA: Diagnosis not present

## 2016-02-15 DIAGNOSIS — Z8249 Family history of ischemic heart disease and other diseases of the circulatory system: Secondary | ICD-10-CM | POA: Insufficient documentation

## 2016-02-15 DIAGNOSIS — Z87442 Personal history of urinary calculi: Secondary | ICD-10-CM | POA: Diagnosis not present

## 2016-02-15 DIAGNOSIS — Z885 Allergy status to narcotic agent status: Secondary | ICD-10-CM | POA: Diagnosis not present

## 2016-02-15 LAB — COMPREHENSIVE METABOLIC PANEL
ALT: 10 U/L — AB (ref 14–54)
AST: 13 U/L — ABNORMAL LOW (ref 15–41)
Albumin: 2.5 g/dL — ABNORMAL LOW (ref 3.5–5.0)
Alkaline Phosphatase: 147 U/L — ABNORMAL HIGH (ref 38–126)
Anion gap: 5 (ref 5–15)
BUN: 11 mg/dL (ref 6–20)
CALCIUM: 8.4 mg/dL — AB (ref 8.9–10.3)
CHLORIDE: 107 mmol/L (ref 101–111)
CO2: 25 mmol/L (ref 22–32)
Creatinine, Ser: 0.44 mg/dL (ref 0.44–1.00)
GLUCOSE: 96 mg/dL (ref 65–99)
POTASSIUM: 4.1 mmol/L (ref 3.5–5.1)
Sodium: 137 mmol/L (ref 135–145)
TOTAL PROTEIN: 5.3 g/dL — AB (ref 6.5–8.1)
Total Bilirubin: 0.9 mg/dL (ref 0.3–1.2)

## 2016-02-15 LAB — CBC
HCT: 35.1 % — ABNORMAL LOW (ref 36.0–46.0)
Hemoglobin: 12.2 g/dL (ref 12.0–15.0)
MCH: 33.8 pg (ref 26.0–34.0)
MCHC: 34.8 g/dL (ref 30.0–36.0)
MCV: 97.2 fL (ref 78.0–100.0)
PLATELETS: 197 10*3/uL (ref 150–400)
RBC: 3.61 MIL/uL — ABNORMAL LOW (ref 3.87–5.11)
RDW: 13.7 % (ref 11.5–15.5)
WBC: 11.9 10*3/uL — ABNORMAL HIGH (ref 4.0–10.5)

## 2016-02-15 LAB — URINALYSIS, ROUTINE W REFLEX MICROSCOPIC
BILIRUBIN URINE: NEGATIVE
GLUCOSE, UA: 150 mg/dL — AB
HGB URINE DIPSTICK: NEGATIVE
Ketones, ur: NEGATIVE mg/dL
LEUKOCYTES UA: NEGATIVE
Nitrite: NEGATIVE
PH: 6 (ref 5.0–8.0)
Protein, ur: 30 mg/dL — AB
SPECIFIC GRAVITY, URINE: 1.026 (ref 1.005–1.030)

## 2016-02-15 LAB — PROTEIN / CREATININE RATIO, URINE
Creatinine, Urine: 166 mg/dL
PROTEIN CREATININE RATIO: 0.16 mg/mg{creat} — AB (ref 0.00–0.15)
Total Protein, Urine: 26 mg/dL

## 2016-02-15 MED ORDER — ONDANSETRON 8 MG PO TBDP
8.0000 mg | ORAL_TABLET | Freq: Once | ORAL | Status: AC
  Administered 2016-02-15: 8 mg via ORAL
  Filled 2016-02-15: qty 1

## 2016-02-15 NOTE — MAU Provider Note (Signed)
Chief Complaint:  Fever and Hypertension (pt took own bp at home)   None     HPI: Gabrielle Roy is a 25 y.o. G2P0101 at 3837w4dwho presents to maternity admissions reporting she took her BP at home and it was 144/95. She called the nurse call line and was told to take her temperature, which was 99.0.  She denies h/a, epigastric pain, or visual disturbances but does report onset of nausea today without vomiting and diarrhea x 3 in last 24 hours.  She denies any respiratory symptoms or abdominal pain. She denies feeling feverish or having chills.  She has not tried any treatments and nothing makes the symptoms better or worse.  She came to MAU to be evaluated because of her BP. She reports good fetal movement, denies LOF, vaginal bleeding, vaginal itching/burning, urinary symptoms, h/a, dizziness, n/v, or chills.    HPI  Past Medical History: Past Medical History:  Diagnosis Date  . Kidney stone    Stent placed in Feb 2017  . Supervision of normal pregnancy, antepartum 10/04/2015    Clinic  [place] - transfer from Dr. Shawnie Ponsorn Prenatal Labs Dating  Blood type:    Genetic Screen 1 Screen:    AFP:     Quad:     NIPS: Antibody:  Anatomic US  Nml fetal anatomy @ 18 wks; low lying placenta > resolved @23  wks Rubella:   GTT Early:               Third trimester:  RPR:    Flu vaccine  Declined HBsAg:    TDaP vaccine                                               Rhogam: HIV:    Baby Food  Breastfeed                                            GBS: (For PCN allergy, check sensitivities) Contraception  Pap: Circumcision  n/a female ([facility] ultrasound)  Pediatrician   Support Person  [name] (husband)      Past obstetric history: OB History  Gravida Para Term Preterm AB Living  2 1   1   1   SAB TAB Ectopic Multiple Live Births          1    # Outcome Date GA Lbr Len/2nd Weight Sex Delivery Anes PTL Lv  2 Current           1 Preterm 07/20/12 4641w0d  5 lb (2.268 kg) F CS-LTranv  N LIV     Complications:  Fetal Intolerance,Oligohydramnios antepartum      Past Surgical History: Past Surgical History:  Procedure Laterality Date  . CESAREAN SECTION    . FINGER SURGERY    . kidney stint      Family History: Family History  Problem Relation Age of Onset  . Hypertension Father   . Heart disease Father     Social History: Social History  Substance Use Topics  . Smoking status: Former Games developermoker  . Smokeless tobacco: Never Used  . Alcohol use No    Allergies:  Allergies  Allergen Reactions  . Alcohol Hives  . Nyquil Multi-Symptom [Pseudoeph-Doxylamine-Dm-Apap] Hives    Other reaction(s): Chest  Pain Hives   . Flagyl [Metronidazole] Other (See Comments)    Facial flushing  . Hydrocodone-Acetaminophen Nausea And Vomiting    Meds:  Prescriptions Prior to Admission  Medication Sig Dispense Refill Last Dose  . clindamycin (CLEOCIN) 300 MG capsule Take 1 capsule (300 mg total) by mouth 2 (two) times daily. 14 capsule 0 Past Month at Unknown time  . Prenatal Vit-Fe Fumarate-FA (PRENATAL MULTIVITAMIN) TABS tablet Take 1 tablet by mouth daily at 12 noon.   Past Month at Unknown time  . butalbital-acetaminophen-caffeine (FIORICET) 50-325-40 MG tablet Take 1-2 tablets by mouth every 6 (six) hours as needed for headache. (Patient not taking: Reported on 02/15/2016) 20 tablet 1 Unknown at Unknown time    ROS:  Review of Systems  Constitutional: Negative for chills, fatigue and fever.  Eyes: Negative for visual disturbance.  Respiratory: Negative for shortness of breath.   Cardiovascular: Negative for chest pain.  Gastrointestinal: Positive for diarrhea and nausea. Negative for abdominal pain and vomiting.  Genitourinary: Negative for difficulty urinating, dysuria, flank pain, pelvic pain, vaginal bleeding, vaginal discharge and vaginal pain.  Neurological: Negative for dizziness and headaches.  Psychiatric/Behavioral: Negative.      I have reviewed patient's Past Medical Hx, Surgical  Hx, Family Hx, Social Hx, medications and allergies.   Physical Exam  Patient Vitals for the past 24 hrs:  BP Temp Temp src Pulse Resp  02/15/16 2044 121/68 - - 108 -  02/15/16 2030 120/78 - - 105 -  02/15/16 2028 - 98.8 F (37.1 C) Oral - 20   Constitutional: Well-developed, well-nourished female in no acute distress.  Cardiovascular: normal rate Respiratory: normal effort GI: Abd soft, non-tender, gravid appropriate for gestational age.  MS: Extremities nontender, no edema, normal ROM Neurologic: Alert and oriented x 4.  GU: Neg CVAT.    FHT:  Baseline 135 , moderate variability, accelerations present, no decelerations Contractions: None on toco or to palpation   Labs:  O/Positive/-- (04/27 0000)  Results for orders placed or performed during the hospital encounter of 02/15/16 (from the past 168 hour(s))  Urinalysis, Routine w reflex microscopic   Collection Time: 02/15/16  8:10 PM  Result Value Ref Range   Color, Urine YELLOW YELLOW   APPearance HAZY (A) CLEAR   Specific Gravity, Urine 1.026 1.005 - 1.030   pH 6.0 5.0 - 8.0   Glucose, UA 150 (A) NEGATIVE mg/dL   Hgb urine dipstick NEGATIVE NEGATIVE   Bilirubin Urine NEGATIVE NEGATIVE   Ketones, ur NEGATIVE NEGATIVE mg/dL   Protein, ur 30 (A) NEGATIVE mg/dL   Nitrite NEGATIVE NEGATIVE   Leukocytes, UA NEGATIVE NEGATIVE   RBC / HPF 6-30 0 - 5 RBC/hpf   WBC, UA 0-5 0 - 5 WBC/hpf   Bacteria, UA MANY (A) NONE SEEN   Squamous Epithelial / LPF 0-5 (A) NONE SEEN   Mucous PRESENT    Ca Oxalate Crys, UA PRESENT   Protein / creatinine ratio, urine   Collection Time: 02/15/16  8:10 PM  Result Value Ref Range   Creatinine, Urine 166.00 mg/dL   Total Protein, Urine 26 mg/dL   Protein Creatinine Ratio 0.16 (H) 0.00 - 0.15 mg/mg[Cre]  CBC   Collection Time: 02/15/16  9:49 PM  Result Value Ref Range   WBC 11.9 (H) 4.0 - 10.5 K/uL   RBC 3.61 (L) 3.87 - 5.11 MIL/uL   Hemoglobin 12.2 12.0 - 15.0 g/dL   HCT 16.1 (L) 09.6 -  46.0 %   MCV 97.2 78.0 -  100.0 fL   MCH 33.8 26.0 - 34.0 pg   MCHC 34.8 30.0 - 36.0 g/dL   RDW 16.113.7 09.611.5 - 04.515.5 %   Platelets 197 150 - 400 K/uL  Comprehensive metabolic panel   Collection Time: 02/15/16  9:49 PM  Result Value Ref Range   Sodium 137 135 - 145 mmol/L   Potassium 4.1 3.5 - 5.1 mmol/L   Chloride 107 101 - 111 mmol/L   CO2 25 22 - 32 mmol/L   Glucose, Bld 96 65 - 99 mg/dL   BUN 11 6 - 20 mg/dL   Creatinine, Ser 4.090.44 0.44 - 1.00 mg/dL   Calcium 8.4 (L) 8.9 - 10.3 mg/dL   Total Protein 5.3 (L) 6.5 - 8.1 g/dL   Albumin 2.5 (L) 3.5 - 5.0 g/dL   AST 13 (L) 15 - 41 U/L   ALT 10 (L) 14 - 54 U/L   Alkaline Phosphatase 147 (H) 38 - 126 U/L   Total Bilirubin 0.9 0.3 - 1.2 mg/dL   GFR calc non Af Amer >60 >60 mL/min   GFR calc Af Amer >60 >60 mL/min   Anion gap 5 5 - 15  Results for orders placed or performed in visit on 02/12/16 (from the past 168 hour(s))  POCT Glucose (CBG)   Collection Time: 02/12/16 12:20 PM  Result Value Ref Range   POC Glucose 79 70 - 99 mg/dl    Imaging:  [facility] Ob Follow Up  Result Date: 01/30/2016 OBSTETRICAL ULTRASOUND: This exam was performed within a [facility]. The OB US report was generated in the AS system, and faxed to the ordering physician.  This report is available in the [employer] PACS. See the AS Obstetric US report via the Image Link.   MAU Course/MDM: I have ordered labs and reviewed results.  NST reviewed No evidence of preeclampsia today, pt reports nausea only in MAU, which resolved completely with Zofran ODT.  Pt to take Zofran as previous prescribed PRN at home.  Preeclampsia precautions reviewed.  Pt to f/u in office as scheduled. Return to MAU as needed for emergencies. Pt stable at time of discharge.  Assessment:   Nausea/Vomiting in pregnancy  Plan: Discharge home Labor precautions and fetal kick counts     Medication List    ASK your doctor about these medications    butalbital-acetaminophen-caffeine 50-325-40 MG tablet Commonly known as:  FIORICET Take 1-2 tablets by mouth every 6 (six) hours as needed for headache.   clindamycin 300 MG capsule Commonly known as:  CLEOCIN Take 1 capsule (300 mg total) by mouth 2 (two) times daily.   prenatal multivitamin Tabs tablet Take 1 tablet by mouth daily at 12 noon.      Sharen CounterLisa Leftwich-Kirby Certified Nurse-Midwife 02/15/2016 9:33 PM

## 2016-02-15 NOTE — MAU Note (Addendum)
G2P1 @ 37.[redacted] wksga. Previous c/s due to oligohydraminos. Denies LOF and bleeding. Presents to triage for low grade fever and HTN (144/90) taken at home.   2030: now c/o nausea and wants meds for it.   2036: Provider notified. Report status of pt given. No orders received at this time.   2112: provider notified. Report status of pt given.   2115: Provider in unit reviewing pt's chart  2127: provider at bs discussing POC  2140: orders acknowledged.   2238: discharge instructions provided with pt understanding. Pt left unit via ambulatory with so.

## 2016-02-17 ENCOUNTER — Inpatient Hospital Stay (HOSPITAL_COMMUNITY)
Admission: AD | Admit: 2016-02-17 | Discharge: 2016-02-17 | Disposition: A | Discharge status: Home / Self Care | Payer: Medicaid Other | Source: Ambulatory Visit | Attending: Obstetrics and Gynecology | Admitting: Obstetrics and Gynecology

## 2016-02-17 ENCOUNTER — Inpatient Hospital Stay (HOSPITAL_COMMUNITY): Payer: Medicaid Other

## 2016-02-17 ENCOUNTER — Encounter (HOSPITAL_COMMUNITY): Payer: Self-pay | Admitting: *Deleted

## 2016-02-17 ENCOUNTER — Telehealth (HOSPITAL_COMMUNITY): Payer: Self-pay | Admitting: *Deleted

## 2016-02-17 DIAGNOSIS — Z3480 Encounter for supervision of other normal pregnancy, unspecified trimester: Secondary | ICD-10-CM

## 2016-02-17 DIAGNOSIS — S8992XA Unspecified injury of left lower leg, initial encounter: Secondary | ICD-10-CM

## 2016-02-17 DIAGNOSIS — Z87891 Personal history of nicotine dependence: Secondary | ICD-10-CM | POA: Insufficient documentation

## 2016-02-17 DIAGNOSIS — S8991XA Unspecified injury of right lower leg, initial encounter: Secondary | ICD-10-CM | POA: Diagnosis not present

## 2016-02-17 DIAGNOSIS — O4440 Low lying placenta NOS or without hemorrhage, unspecified trimester: Secondary | ICD-10-CM

## 2016-02-17 DIAGNOSIS — Z3A37 37 weeks gestation of pregnancy: Secondary | ICD-10-CM | POA: Diagnosis not present

## 2016-02-17 DIAGNOSIS — O26893 Other specified pregnancy related conditions, third trimester: Secondary | ICD-10-CM | POA: Insufficient documentation

## 2016-02-17 DIAGNOSIS — O99210 Obesity complicating pregnancy, unspecified trimester: Secondary | ICD-10-CM

## 2016-02-17 DIAGNOSIS — O34219 Maternal care for unspecified type scar from previous cesarean delivery: Secondary | ICD-10-CM

## 2016-02-17 DIAGNOSIS — W108XXA Fall (on) (from) other stairs and steps, initial encounter: Secondary | ICD-10-CM

## 2016-02-17 DIAGNOSIS — O99340 Other mental disorders complicating pregnancy, unspecified trimester: Secondary | ICD-10-CM

## 2016-02-17 DIAGNOSIS — F419 Anxiety disorder, unspecified: Secondary | ICD-10-CM

## 2016-02-17 DIAGNOSIS — W109XXA Fall (on) (from) unspecified stairs and steps, initial encounter: Secondary | ICD-10-CM | POA: Diagnosis not present

## 2016-02-17 DIAGNOSIS — O9A213 Injury, poisoning and certain other consequences of external causes complicating pregnancy, third trimester: Secondary | ICD-10-CM

## 2016-02-17 LAB — URINALYSIS, ROUTINE W REFLEX MICROSCOPIC
Bilirubin Urine: NEGATIVE
GLUCOSE, UA: NEGATIVE mg/dL
Ketones, ur: 15 mg/dL — AB
LEUKOCYTES UA: NEGATIVE
Nitrite: NEGATIVE
PROTEIN: NEGATIVE mg/dL
Specific Gravity, Urine: 1.02 (ref 1.005–1.030)
pH: 7 (ref 5.0–8.0)

## 2016-02-17 LAB — URINALYSIS, MICROSCOPIC (REFLEX): WBC, UA: NONE SEEN WBC/hpf (ref 0–5)

## 2016-02-17 MED ORDER — CYCLOBENZAPRINE HCL 10 MG PO TABS
10.0000 mg | ORAL_TABLET | Freq: Once | ORAL | Status: AC
  Administered 2016-02-17: 10 mg via ORAL
  Filled 2016-02-17: qty 1

## 2016-02-17 MED ORDER — CYCLOBENZAPRINE HCL 10 MG PO TABS: 10.0000 mg | ORAL_TABLET | Freq: Three times a day (TID) | ORAL | 0 refills | Status: DC | PRN

## 2016-02-17 NOTE — Discharge Instructions (Signed)
What Do I Need to Know About Injuries During Pregnancy? Injuries can happen during pregnancy. Minor falls and accidents usually do not harm you or your baby. However, any injury should be reported to your doctor. What can I do to protect myself from injuries?  Remove rugs and loose objects on the floor.  Wear comfortable shoes that have a good grip. Do not wear high-heeled shoes.  Always wear your seat belt. The lap belt should be below your belly. Always practice safe driving.  Do not ride on a motorcycle.  Do not participate in high-impact activities or sports.  Avoid:  Walking on wet or slippery floors.  Fires.  Starting fires.  Lifting heavy pots of boiling or hot liquids.  Fixing electrical problems.  Only take medicine as told by your doctor.  Know your blood type and the blood type of the baby's father.  Call your local emergency services (911 in the [place] if you are a victim of domestic violence or assault. For help and support, contact the [employer] Domestic Violence Hotline. Get help right away if:  You fall on your belly or have any high-impact accident or injury.  You have been a victim of domestic violence or any kind of violence.  You have been in a car accident.  You have bleeding from your vagina.  Fluid is leaking from your vagina.  You start to have belly cramping (contractions) or pain.  You feel weak or pass out (faint).  You start to throw up (vomit) after an injury.  You have been burned.  You have a stiff neck or neck pain.  You get a headache or have vision problems after an injury.  You do not feel the baby move or the baby is not moving as much as normal. This information is not intended to replace advice given to you by your health care provider. Make sure you discuss any questions you have with your health care provider. Document Released: 03/28/2010 Document Revised: 08/01/2015 Document Reviewed: 11/30/2012 Elsevier Interactive  Patient Education  2017 Elsevier Inc.   Placental Abruption Placental abruption is a condition in which the placenta partly or completely separates from the uterus before the baby is born. The placenta is the organ that nourishes the unborn baby (fetus). The baby gets his or her blood supply and nutrients through the placenta. It is the babys life support system. The placenta is attached to the inside of the uterus until after the baby is born. Placental abruption is rare, but it can happen any time after 20 weeks of pregnancy. A small separation may not cause problems, but a large separation may be dangerous for you and your baby. A large separation is usually an emergency. It requires treatment right away. What are the causes? In most cases, the cause of this condition is not known. What increases the risk? This condition is more likely to develop in women who:  Have experienced a recent trauma such as a fall, an abdominal injury, or a car accident.  Have a previous placental abruption.  Have high blood pressure (hypertension).  Smoke cigarettes, use alcohol, or use illegal drugs such as cocaine.  Have blood clotting problems.  Experience preterm premature rupture of membranes (PPROM).  Have multiples (twins, triplets, or more).  Have had children before.  Are 24 years of age or older. What are the signs or symptoms? Symptoms of this condition can vary from mild to severe. A small placental abruption may not cause symptoms, or  it may cause mild symptoms, which may include:  Mild abdominal pain or lower back pain.  Slight vaginal bleeding. A severe placental abruption will cause symptoms. The symptoms will depend on the size of the separation and the stage of pregnancy. They may include:  Abdominal pain or lower back pain.  Vaginal bleeding.  Tender and hard uterus.  Severe abdominal pain with tenderness.  Continual contractions of your uterus.  Weakness and  light-headedness. How is this diagnosed? This condition may be diagnosed based on:  Your symptoms.  A physical exam.  Ultrasound.  Blood work. This will be done to make sure that there are enough healthy red blood cells and that there are no clotting problems or signs of too much blood loss. How is this treated? Treatment for placental abruption depends on the severity of the condition. For mild cases, treatment may involve monitoring your condition and managing your symptoms. This may involve:  Bed rest and close observation. For more severe cases, emergency treatment is needed. This may involve:  Staying in the hospital until you and your baby are stabilized.  Cesarean delivery of your baby.  A blood transfusion or other fluids given through an IV tube.  Other treatments, depending on:  The amount of bleeding you have.  Whether you or your baby are in distress.  The stage of your pregnancy.  The maturity of the baby. Follow these instructions at home:  Take over-the-counter and prescription medicines only as told by your health care provider. Do not take any medicines that your health care provider has not approved.  Arrange for help at home before and after you deliver your baby, especially if you had a cesarean delivery or if you lost a lot of blood.  Get plenty of rest and sleep.  Do not use illegal drugs.  Do not drink alcohol.  Do not have sexual intercourse until your health care provider says it is okay.  Do not use tampons or douche unless your health care provider says it is okay.  Do not use any products that contain nicotine or tobacco, such as cigarettes and e-cigarettes. If you need help quitting, ask your health care provider. Get help right away if:  You have vaginal bleeding or spotting.  You have any type of trauma, such as a fall, abdominal trauma, or a car accident.  You have abdominal pain.  You have continuous uterine  contractions.  You have a hard, tender uterus.  You do not feel the baby move, or the baby moves very little. This information is not intended to replace advice given to you by your health care provider. Make sure you discuss any questions you have with your health care provider. Document Released: 02/23/2005 Document Revised: 10/24/2015 Document Reviewed: 09/15/2015 Elsevier Interactive Patient Education  2017 [employer]

## 2016-02-17 NOTE — Telephone Encounter (Signed)
Preadmission screen  

## 2016-02-17 NOTE — MAU Provider Note (Signed)
History     CSN: [id number]  Arrival date and time: 02/17/16 1540   First Provider Initiated Contact with Patient 02/17/16 1626      Chief Complaint  Patient presents with  . Fall   HPI   [name] is a 25 y.o. female 602P0101 @ 2827w6d here in MAU following a fall that occurred at home approximately 2 hours ago.  She was going up her brick steps and she fell and hit her knees first, then fell onto her stomach and then onto her face. She has scrapes on her knees only.  She denies vaginal bleeding or leaking.  Baby is not all that active which is normal. She has not felt the baby since her arrival.   OB History    Gravida Para Term Preterm AB Living   2 1   1   1    SAB TAB Ectopic Multiple Live Births           1      Past Medical History:  Diagnosis Date  . Kidney stone    Stent placed in Feb 2017  . Supervision of normal pregnancy, antepartum 10/04/2015    Clinic  [facility] - transfer from Dr. Shawnie Ponsorn Prenatal Labs Dating  Blood type:    Genetic Screen 1 Screen:    AFP:     Quad:     NIPS: Antibody:  Anatomic US  Nml fetal anatomy @ 18 wks; low lying placenta > resolved @23  wks Rubella:   GTT Early:               Third trimester:  RPR:    Flu vaccine  Declined HBsAg:    TDaP vaccine                                               Rhogam: HIV:    Baby Food  Breastfeed                                            GBS: (For PCN allergy, check sensitivities) Contraception  Pap: Circumcision  n/a female ([facility] ultrasound)  Pediatrician   Support Person  Gabrielle Roy (husband)      Past Surgical History:  Procedure Laterality Date  . CESAREAN SECTION    . FINGER SURGERY    . kidney stint      Family History  Problem Relation Age of Onset  . Hypertension Father   . Heart disease Father     Social History  Substance Use Topics  . Smoking status: Former Games developermoker  . Smokeless tobacco: Never Used  . Alcohol use No    Allergies:  Allergies  Allergen Reactions  . Alcohol  Hives  . Nyquil Multi-Symptom [Pseudoeph-Doxylamine-Dm-Apap] Hives    Other reaction(s): Chest Pain Hives   . Flagyl [Metronidazole] Other (See Comments)    Facial flushing  . Hydrocodone-Acetaminophen Nausea And Vomiting    Prescriptions Prior to Admission  Medication Sig Dispense Refill Last Dose  . butalbital-acetaminophen-caffeine (FIORICET) 50-325-40 MG tablet Take 1-2 tablets by mouth every 6 (six) hours as needed for headache. (Patient not taking: Reported on 02/15/2016) 20 tablet 1 Unknown at Unknown time  . clindamycin (CLEOCIN) 300 MG capsule Take 1 capsule (300 mg total) by  mouth 2 (two) times daily. 14 capsule 0 Past Month at Unknown time  . Prenatal Vit-Fe Fumarate-FA (PRENATAL MULTIVITAMIN) TABS tablet Take 1 tablet by mouth daily at 12 noon.   Past Month at Unknown time   Results for orders placed or performed during the hospital encounter of 02/17/16 (from the past 48 hour(s))  Urinalysis, Routine w reflex microscopic     Status: Abnormal   Collection Time: 02/17/16  4:51 PM  Result Value Ref Range   Color, Urine YELLOW YELLOW   APPearance CLEAR CLEAR   Specific Gravity, Urine 1.020 1.005 - 1.030   pH 7.0 5.0 - 8.0   Glucose, UA NEGATIVE NEGATIVE mg/dL   Hgb urine dipstick TRACE (A) NEGATIVE   Bilirubin Urine NEGATIVE NEGATIVE   Ketones, ur 15 (A) NEGATIVE mg/dL   Protein, ur NEGATIVE NEGATIVE mg/dL   Nitrite NEGATIVE NEGATIVE   Leukocytes, UA NEGATIVE NEGATIVE  Urinalysis, Microscopic (reflex)     Status: Abnormal   Collection Time: 02/17/16  4:51 PM  Result Value Ref Range   RBC / HPF 0-5 0 - 5 RBC/hpf   WBC, UA NONE SEEN 0 - 5 WBC/hpf   Bacteria, UA MANY (A) NONE SEEN   Squamous Epithelial / LPF 0-5 (A) NONE SEEN   Mucous MUCOUS PRESENT    Review of Systems  Gastrointestinal: Positive for abdominal pain (+ pelvic pain. Not a new pain ).  Genitourinary: Negative for dysuria.   Physical Exam   Blood pressure 129/77, pulse 108, temperature 99.2 F (37.3  C), temperature source Oral, resp. rate 18.  Physical Exam  Constitutional: She is oriented to person, place, and time. She appears well-developed and well-nourished. No distress.  HENT:  Head: Normocephalic.  Respiratory: Effort normal.  GI: Soft. She exhibits no distension. There is no tenderness.  Genitourinary:  Genitourinary Comments: Dilation: Closed Exam by:: Shela CommonsJ. Rasch NP  Neurological: She is alert and oriented to person, place, and time.  Skin: Skin is warm. She is not diaphoretic.  Psychiatric: Her behavior is normal.   Fetal Tracing: Baseline: 130 bpm  Variability: Moderate  Accelerations: 15x15 Decelerations: None Toco: occasional UI   MAU Course  Procedures  None  MDM  -Fetal monitoring X 4 hours -Patient reporting worsening of abdominal pain and lower abdominal cramping. Flexeril ordered 10 mg PO. Discussed patient with Dr. Adrian BlackwaterStinson, fetal tracing reviewed. Will send patient for OB limited US to assess placenta.  Continues to deny vaginal bleeding.  -Urine culture  Report given to [place] Babacar Roy who resumes care of the patient.   No evidence of abruption on prelim US. Pain resolved w/ Flexeril. No VB, LOF or contractions. OK for D/C per consult w/ Dr. Adrian BlackwaterStinson.   Assessment and Plan   1. Traumatic injury during pregnancy in third trimester    D/C home in stable condition.  Abruption precautions.  Comfort measures Rx Flexeril.  Follow-up Information    [facility] Follow up.   Why:  as needed if symptoms worsen Contact information: [address] [id number] [place] [id number] [phone]       [facility] Follow up.   Specialty:  Obstetrics and Gynecology Why:  as scheduled Contact information: [address] [place] [phone]           Medication List    STOP taking these medications    butalbital-acetaminophen-caffeine 50-325-40 MG tablet Commonly known as:  [place]FIORICET  clindamycin 300 MG capsule Commonly known as:  CLEOCIN     TAKE these medications   acetaminophen 325 MG tablet Commonly known as:  TYLENOL Take 325 mg by mouth every 6 (six) hours as needed for headache.   calcium carbonate 500 MG chewable tablet Commonly known as:  TUMS - dosed in mg elemental calcium Chew 2 tablets by mouth 3 times/day as needed-between meals & bedtime for indigestion or heartburn. Patient takes up to five times daily.   cyclobenzaprine 10 MG tablet Commonly known as:  FLEXERIL Take 1 tablet (10 mg total) by mouth 3 (three) times daily as needed for muscle spasms.   ondansetron 4 MG tablet Commonly known as:  ZOFRAN Take 4 mg by mouth every 8 (eight) hours as needed for nausea or vomiting.      [place], CNM 02/17/2016 9:34 PM

## 2016-02-17 NOTE — MAU Note (Signed)
Larey SeatFell going up the steps.  Hit knees, then abd and face. abd pain-cramping, feeling a lot of pressure.

## 2016-02-18 ENCOUNTER — Encounter (HOSPITAL_COMMUNITY): Payer: Self-pay

## 2016-02-19 ENCOUNTER — Encounter: Payer: Self-pay | Admitting: Obstetrics & Gynecology

## 2016-02-19 ENCOUNTER — Encounter (HOSPITAL_COMMUNITY): Admission: RE | Admit: 2016-02-19 | Payer: Medicaid Other | Source: Ambulatory Visit

## 2016-02-19 ENCOUNTER — Ambulatory Visit (INDEPENDENT_AMBULATORY_CARE_PROVIDER_SITE_OTHER): Payer: Medicaid Other | Admitting: Obstetrics & Gynecology

## 2016-02-19 VITALS — BP 145/85 | HR 105 | Wt 298.0 lb

## 2016-02-19 DIAGNOSIS — O133 Gestational [pregnancy-induced] hypertension without significant proteinuria, third trimester: Secondary | ICD-10-CM | POA: Diagnosis not present

## 2016-02-19 LAB — CBC
HCT: 36.6 % (ref 35.0–45.0)
HEMOGLOBIN: 12.8 g/dL (ref 11.7–15.5)
MCH: 33.9 pg — AB (ref 27.0–33.0)
MCHC: 35 g/dL (ref 32.0–36.0)
MCV: 96.8 fL (ref 80.0–100.0)
MPV: 9.5 fL (ref 7.5–12.5)
PLATELETS: 204 10*3/uL (ref 140–400)
RBC: 3.78 MIL/uL — ABNORMAL LOW (ref 3.80–5.10)
RDW: 13.5 % (ref 11.0–15.0)
WBC: 10.3 10*3/uL (ref 3.8–10.8)

## 2016-02-19 LAB — PROTEIN / CREATININE RATIO, URINE
Creatinine, Urine: 135 mg/dL (ref 20–320)
Protein Creatinine Ratio: 163 mg/g creat — ABNORMAL HIGH (ref 21–161)
TOTAL PROTEIN, URINE: 22 mg/dL (ref 5–24)

## 2016-02-19 LAB — CULTURE, OB URINE: Special Requests: NORMAL

## 2016-02-19 LAB — COMPREHENSIVE METABOLIC PANEL
ALT: 7 U/L (ref 6–29)
AST: 11 U/L (ref 10–30)
Albumin: 3 g/dL — ABNORMAL LOW (ref 3.6–5.1)
Alkaline Phosphatase: 134 U/L — ABNORMAL HIGH (ref 33–115)
BILIRUBIN TOTAL: 0.7 mg/dL (ref 0.2–1.2)
BUN: 7 mg/dL (ref 7–25)
CHLORIDE: 104 mmol/L (ref 98–110)
CO2: 24 mmol/L (ref 20–31)
CREATININE: 0.48 mg/dL — AB (ref 0.50–1.10)
Calcium: 8.7 mg/dL (ref 8.6–10.2)
GLUCOSE: 84 mg/dL (ref 65–99)
Potassium: 4 mmol/L (ref 3.5–5.3)
SODIUM: 135 mmol/L (ref 135–146)
Total Protein: 5.4 g/dL — ABNORMAL LOW (ref 6.1–8.1)

## 2016-02-19 NOTE — Progress Notes (Signed)
   PRENATAL VISIT NOTE  Subjective:  Gabrielle Roy is a 25 y.o. G2P0101 at 54w1dbeing seen today for ongoing prenatal care.  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  Patient reports no complaints.  Contractions: Irregular. Vag. Bleeding: None.  Movement: Present. Denies leaking of fluid.   The following portions of the patient's history were reviewed and updated as appropriate: allergies, current medications, past family history, past medical history, past social history, past surgical history and problem list. Problem list updated.  Objective:   Vitals:   02/19/16 0826  BP: (!) 145/85  Pulse: (!) 105  Weight: 298 lb (135.2 kg)    Fetal Status: Fetal Heart Rate (bpm): 152 Fundal Height: 47 cm Movement: Present     General:  Alert, oriented and cooperative. Patient is in no acute distress.  Skin: Skin is warm and dry. No rash noted.   Cardiovascular: Normal heart rate noted  Respiratory: Normal respiratory effort, no problems with respiration noted  Abdomen: Soft, gravid, appropriate for gestational age. Pain/Pressure: Present     Pelvic:  Cervical exam deferred        Extremities: Normal range of motion.  Edema: Trace  Mental Status: Normal mood and affect. Normal behavior. Normal judgment and thought content.   Assessment and Plan:  Pregnancy: G2P0101 at 363w1d1. Pregnancy-induced hypertension in third trimester - C/S planned for tomorrow due to new diagnosis of gestational HTN -Labs sent stat - CBC - Comp Met (CMET) - Protein / Creatinine Ratio, Urine -NST today--Reactive  Term labor symptoms and general  obstetric precautions including but not limited to vaginal bleeding, contractions, leaking of fluid and fetal movement were reviewed in detail with the patient. Please refer to After Visit Summary for other counseling recommendations.  No Follow-up on file.   KeGuss BundeMD

## 2016-02-20 ENCOUNTER — Encounter (HOSPITAL_COMMUNITY): Payer: Self-pay | Admitting: Emergency Medicine

## 2016-02-20 ENCOUNTER — Inpatient Hospital Stay (HOSPITAL_COMMUNITY)
Admission: RE | Admit: 2016-02-20 | Discharge: 2016-02-22 | DRG: 765 | Disposition: A | Discharge status: Home / Self Care | Payer: Medicaid Other | Source: Ambulatory Visit | Attending: Obstetrics & Gynecology | Admitting: Obstetrics & Gynecology

## 2016-02-20 ENCOUNTER — Inpatient Hospital Stay (HOSPITAL_COMMUNITY): Payer: Medicaid Other | Admitting: Certified Registered Nurse Anesthetist

## 2016-02-20 ENCOUNTER — Encounter (HOSPITAL_COMMUNITY)
Admission: RE | Disposition: A | Discharge status: Home / Self Care | Payer: Self-pay | Source: Ambulatory Visit | Attending: Obstetrics and Gynecology

## 2016-02-20 DIAGNOSIS — Z3A38 38 weeks gestation of pregnancy: Secondary | ICD-10-CM | POA: Diagnosis not present

## 2016-02-20 DIAGNOSIS — O34211 Maternal care for low transverse scar from previous cesarean delivery: Secondary | ICD-10-CM | POA: Diagnosis present

## 2016-02-20 DIAGNOSIS — Z6841 Body Mass Index (BMI) 40.0 and over, adult: Secondary | ICD-10-CM

## 2016-02-20 DIAGNOSIS — O99214 Obesity complicating childbirth: Secondary | ICD-10-CM | POA: Diagnosis present

## 2016-02-20 DIAGNOSIS — Z87891 Personal history of nicotine dependence: Secondary | ICD-10-CM | POA: Diagnosis not present

## 2016-02-20 DIAGNOSIS — E119 Type 2 diabetes mellitus without complications: Secondary | ICD-10-CM | POA: Diagnosis present

## 2016-02-20 DIAGNOSIS — O24420 Gestational diabetes mellitus in childbirth, diet controlled: Secondary | ICD-10-CM

## 2016-02-20 DIAGNOSIS — O2492 Unspecified diabetes mellitus in childbirth: Secondary | ICD-10-CM | POA: Diagnosis present

## 2016-02-20 DIAGNOSIS — Z8249 Family history of ischemic heart disease and other diseases of the circulatory system: Secondary | ICD-10-CM | POA: Diagnosis not present

## 2016-02-20 DIAGNOSIS — O134 Gestational [pregnancy-induced] hypertension without significant proteinuria, complicating childbirth: Principal | ICD-10-CM | POA: Diagnosis present

## 2016-02-20 DIAGNOSIS — O34219 Maternal care for unspecified type scar from previous cesarean delivery: Secondary | ICD-10-CM

## 2016-02-20 DIAGNOSIS — F419 Anxiety disorder, unspecified: Secondary | ICD-10-CM

## 2016-02-20 DIAGNOSIS — O139 Gestational [pregnancy-induced] hypertension without significant proteinuria, unspecified trimester: Secondary | ICD-10-CM | POA: Diagnosis present

## 2016-02-20 DIAGNOSIS — O99210 Obesity complicating pregnancy, unspecified trimester: Secondary | ICD-10-CM

## 2016-02-20 DIAGNOSIS — O4440 Low lying placenta NOS or without hemorrhage, unspecified trimester: Secondary | ICD-10-CM

## 2016-02-20 DIAGNOSIS — Z3480 Encounter for supervision of other normal pregnancy, unspecified trimester: Secondary | ICD-10-CM

## 2016-02-20 DIAGNOSIS — O99340 Other mental disorders complicating pregnancy, unspecified trimester: Secondary | ICD-10-CM

## 2016-02-20 HISTORY — DX: Other specified postprocedural states: Z98.890

## 2016-02-20 HISTORY — DX: Nausea with vomiting, unspecified: R11.2

## 2016-02-20 LAB — CBC
HEMATOCRIT: 36.3 % (ref 36.0–46.0)
Hemoglobin: 12.8 g/dL (ref 12.0–15.0)
MCH: 33.9 pg (ref 26.0–34.0)
MCHC: 35.3 g/dL (ref 30.0–36.0)
MCV: 96 fL (ref 78.0–100.0)
Platelets: 204 10*3/uL (ref 150–400)
RBC: 3.78 MIL/uL — AB (ref 3.87–5.11)
RDW: 13.7 % (ref 11.5–15.5)
WBC: 9 10*3/uL (ref 4.0–10.5)

## 2016-02-20 LAB — RPR: RPR Ser Ql: NONREACTIVE

## 2016-02-20 LAB — PREPARE RBC (CROSSMATCH)

## 2016-02-20 LAB — ABO/RH: ABO/RH(D): O POS

## 2016-02-20 SURGERY — Surgical Case
Anesthesia: Spinal | Site: Abdomen

## 2016-02-20 MED ORDER — SIMETHICONE 80 MG PO CHEW
80.0000 mg | CHEWABLE_TABLET | ORAL | Status: DC
  Administered 2016-02-21 – 2016-02-22 (×2): 80 mg via ORAL
  Filled 2016-02-20 (×2): qty 1

## 2016-02-20 MED ORDER — IBUPROFEN 600 MG PO TABS: 600.0000 mg | ORAL_TABLET | Freq: Four times a day (QID) | ORAL | Status: DC

## 2016-02-20 MED ORDER — SIMETHICONE 80 MG PO CHEW: 80.0000 mg | CHEWABLE_TABLET | ORAL | Status: DC | PRN

## 2016-02-20 MED ORDER — COCONUT OIL OIL: 1.0000 "application " | TOPICAL_OIL | Status: DC | PRN

## 2016-02-20 MED ORDER — METOCLOPRAMIDE HCL 5 MG/ML IJ SOLN
INTRAMUSCULAR | Status: DC | PRN
  Administered 2016-02-20: 10 mg via INTRAVENOUS

## 2016-02-20 MED ORDER — BUPIVACAINE HCL (PF) 0.5 % IJ SOLN
INTRAMUSCULAR | Status: DC | PRN
  Administered 2016-02-20: 30 mL

## 2016-02-20 MED ORDER — BUPIVACAINE HCL (PF) 0.5 % IJ SOLN
INTRAMUSCULAR | Status: AC
  Filled 2016-02-20: qty 30

## 2016-02-20 MED ORDER — SIMETHICONE 80 MG PO CHEW
80.0000 mg | CHEWABLE_TABLET | Freq: Three times a day (TID) | ORAL | Status: DC
  Administered 2016-02-20 – 2016-02-22 (×5): 80 mg via ORAL
  Filled 2016-02-20 (×5): qty 1

## 2016-02-20 MED ORDER — PROMETHAZINE HCL 25 MG/ML IJ SOLN: 6.2500 mg | INTRAMUSCULAR | Status: DC | PRN

## 2016-02-20 MED ORDER — MORPHINE SULFATE-NACL 0.5-0.9 MG/ML-% IV SOSY
PREFILLED_SYRINGE | INTRAVENOUS | Status: AC
  Filled 2016-02-20: qty 1

## 2016-02-20 MED ORDER — PHENYLEPHRINE 8 MG IN D5W 100 ML (0.08MG/ML) PREMIX OPTIME
INJECTION | INTRAVENOUS | Status: AC
  Filled 2016-02-20: qty 100

## 2016-02-20 MED ORDER — SENNOSIDES-DOCUSATE SODIUM 8.6-50 MG PO TABS
2.0000 | ORAL_TABLET | ORAL | Status: DC
  Administered 2016-02-21 – 2016-02-22 (×2): 2 via ORAL
  Filled 2016-02-20 (×2): qty 2

## 2016-02-20 MED ORDER — ONDANSETRON HCL 4 MG/2ML IJ SOLN
INTRAMUSCULAR | Status: DC | PRN
  Administered 2016-02-20: 4 mg via INTRAVENOUS

## 2016-02-20 MED ORDER — NALOXONE HCL 2 MG/2ML IJ SOSY
1.0000 ug/kg/h | PREFILLED_SYRINGE | INTRAVENOUS | Status: DC | PRN
  Filled 2016-02-20: qty 2

## 2016-02-20 MED ORDER — LACTATED RINGERS IV SOLN
INTRAVENOUS | Status: DC
  Administered 2016-02-20: 23:00:00 via INTRAVENOUS

## 2016-02-20 MED ORDER — PRENATAL MULTIVITAMIN CH
1.0000 | ORAL_TABLET | Freq: Every day | ORAL | Status: DC
  Administered 2016-02-21: 1 via ORAL
  Filled 2016-02-20 (×2): qty 1

## 2016-02-20 MED ORDER — ACETAMINOPHEN 325 MG PO TABS
650.0000 mg | ORAL_TABLET | ORAL | Status: DC | PRN
  Administered 2016-02-21 (×2): 650 mg via ORAL
  Filled 2016-02-20 (×2): qty 2

## 2016-02-20 MED ORDER — VITAMIN K1 1 MG/0.5ML IJ SOLN
INTRAMUSCULAR | Status: AC
  Filled 2016-02-20: qty 0.5

## 2016-02-20 MED ORDER — SODIUM CHLORIDE 0.9% FLUSH: 3.0000 mL | INTRAVENOUS | Status: DC | PRN

## 2016-02-20 MED ORDER — PHENYLEPHRINE 8 MG IN D5W 100 ML (0.08MG/ML) PREMIX OPTIME
INJECTION | INTRAVENOUS | Status: DC | PRN
  Administered 2016-02-20: 60 ug/min via INTRAVENOUS

## 2016-02-20 MED ORDER — ERYTHROMYCIN 5 MG/GM OP OINT
TOPICAL_OINTMENT | OPHTHALMIC | Status: AC
  Filled 2016-02-20: qty 1

## 2016-02-20 MED ORDER — SODIUM CHLORIDE 0.9 % IR SOLN
Status: DC | PRN
  Administered 2016-02-20: 1000 mL

## 2016-02-20 MED ORDER — TETANUS-DIPHTH-ACELL PERTUSSIS 5-2.5-18.5 LF-MCG/0.5 IM SUSP: 0.5000 mL | Freq: Once | INTRAMUSCULAR | Status: DC

## 2016-02-20 MED ORDER — SCOPOLAMINE 1 MG/3DAYS TD PT72
1.0000 | MEDICATED_PATCH | Freq: Once | TRANSDERMAL | Status: DC
  Filled 2016-02-20: qty 1

## 2016-02-20 MED ORDER — ONDANSETRON HCL 4 MG/2ML IJ SOLN
INTRAMUSCULAR | Status: AC
  Filled 2016-02-20: qty 2

## 2016-02-20 MED ORDER — MEPERIDINE HCL 25 MG/ML IJ SOLN: 6.2500 mg | INTRAMUSCULAR | Status: DC | PRN

## 2016-02-20 MED ORDER — DEXAMETHASONE SODIUM PHOSPHATE 10 MG/ML IJ SOLN
INTRAMUSCULAR | Status: DC | PRN
  Administered 2016-02-20: 10 mg via INTRAVENOUS

## 2016-02-20 MED ORDER — OXYCODONE HCL 5 MG PO TABS
5.0000 mg | ORAL_TABLET | ORAL | Status: DC | PRN
  Administered 2016-02-21 – 2016-02-22 (×2): 5 mg via ORAL
  Filled 2016-02-20 (×2): qty 1

## 2016-02-20 MED ORDER — OXYTOCIN 10 UNIT/ML IJ SOLN
INTRAVENOUS | Status: DC | PRN
  Administered 2016-02-20: 40 [IU] via INTRAVENOUS

## 2016-02-20 MED ORDER — KETOROLAC TROMETHAMINE 30 MG/ML IJ SOLN
30.0000 mg | Freq: Once | INTRAMUSCULAR | Status: AC
  Administered 2016-02-20: 30 mg via INTRAVENOUS
  Filled 2016-02-20: qty 1

## 2016-02-20 MED ORDER — NALOXONE HCL 0.4 MG/ML IJ SOLN: 0.4000 mg | INTRAMUSCULAR | Status: DC | PRN

## 2016-02-20 MED ORDER — IBUPROFEN 600 MG PO TABS
600.0000 mg | ORAL_TABLET | Freq: Four times a day (QID) | ORAL | Status: DC
  Administered 2016-02-21 (×3): 600 mg via ORAL
  Filled 2016-02-20 (×3): qty 1

## 2016-02-20 MED ORDER — ONDANSETRON HCL 4 MG/2ML IJ SOLN
4.0000 mg | Freq: Three times a day (TID) | INTRAMUSCULAR | Status: DC | PRN
Start: 2016-02-20 — End: 2016-02-20

## 2016-02-20 MED ORDER — PHENYLEPHRINE HCL 10 MG/ML IJ SOLN
INTRAMUSCULAR | Status: DC | PRN
  Administered 2016-02-20 (×3): 40 ug via INTRAVENOUS

## 2016-02-20 MED ORDER — SCOPOLAMINE 1 MG/3DAYS TD PT72
MEDICATED_PATCH | TRANSDERMAL | Status: AC
  Administered 2016-02-20: 1.5 mg via TRANSDERMAL
  Filled 2016-02-20: qty 1

## 2016-02-20 MED ORDER — FENTANYL CITRATE (PF) 100 MCG/2ML IJ SOLN
INTRAMUSCULAR | Status: AC
  Filled 2016-02-20: qty 2

## 2016-02-20 MED ORDER — WITCH HAZEL-GLYCERIN EX PADS: 1.0000 "application " | MEDICATED_PAD | CUTANEOUS | Status: DC | PRN

## 2016-02-20 MED ORDER — OXYTOCIN 40 UNITS IN LACTATED RINGERS INFUSION - SIMPLE MED: 2.5000 [IU]/h | INTRAVENOUS | Status: AC

## 2016-02-20 MED ORDER — LACTATED RINGERS IV SOLN
Freq: Once | INTRAVENOUS | Status: AC
  Administered 2016-02-20: 09:00:00 via INTRAVENOUS

## 2016-02-20 MED ORDER — DIPHENHYDRAMINE HCL 25 MG PO CAPS
25.0000 mg | ORAL_CAPSULE | Freq: Four times a day (QID) | ORAL | Status: DC | PRN
  Administered 2016-02-20 – 2016-02-21 (×2): 25 mg via ORAL
  Filled 2016-02-20 (×2): qty 1

## 2016-02-20 MED ORDER — IBUPROFEN 600 MG PO TABS: 600.0000 mg | ORAL_TABLET | Freq: Four times a day (QID) | ORAL | Status: DC | PRN

## 2016-02-20 MED ORDER — LACTATED RINGERS IV SOLN
INTRAVENOUS | Status: DC | PRN
  Administered 2016-02-20: 12:00:00 via INTRAVENOUS

## 2016-02-20 MED ORDER — MORPHINE SULFATE (PF) 0.5 MG/ML IJ SOLN
INTRAMUSCULAR | Status: DC | PRN
  Administered 2016-02-20: .2 mg via INTRATHECAL

## 2016-02-20 MED ORDER — LACTATED RINGERS IV SOLN
INTRAVENOUS | Status: DC
  Administered 2016-02-20 (×3): via INTRAVENOUS

## 2016-02-20 MED ORDER — ZOLPIDEM TARTRATE 5 MG PO TABS: 5.0000 mg | ORAL_TABLET | Freq: Every evening | ORAL | Status: DC | PRN

## 2016-02-20 MED ORDER — SCOPOLAMINE 1 MG/3DAYS TD PT72
1.0000 | MEDICATED_PATCH | Freq: Once | TRANSDERMAL | Status: DC
  Administered 2016-02-20: 1.5 mg via TRANSDERMAL

## 2016-02-20 MED ORDER — MENTHOL 3 MG MT LOZG: 1.0000 | LOZENGE | OROMUCOSAL | Status: DC | PRN

## 2016-02-20 MED ORDER — BUPIVACAINE HCL (PF) 0.25 % IJ SOLN
INTRAMUSCULAR | Status: AC
Start: 2016-02-20 — End: 2016-02-20
  Filled 2016-02-20: qty 20

## 2016-02-20 MED ORDER — FENTANYL CITRATE (PF) 100 MCG/2ML IJ SOLN
INTRAMUSCULAR | Status: DC | PRN
  Administered 2016-02-20: 10 ug via INTRATHECAL

## 2016-02-20 MED ORDER — OXYCODONE HCL 5 MG PO TABS
10.0000 mg | ORAL_TABLET | ORAL | Status: DC | PRN
  Administered 2016-02-21 – 2016-02-22 (×4): 10 mg via ORAL
  Filled 2016-02-20 (×4): qty 2

## 2016-02-20 MED ORDER — PHENYLEPHRINE 40 MCG/ML (10ML) SYRINGE FOR IV PUSH (FOR BLOOD PRESSURE SUPPORT)
PREFILLED_SYRINGE | INTRAVENOUS | Status: AC
  Filled 2016-02-20: qty 10

## 2016-02-20 MED ORDER — OXYTOCIN 10 UNIT/ML IJ SOLN
INTRAMUSCULAR | Status: AC
  Filled 2016-02-20: qty 4

## 2016-02-20 MED ORDER — HYDROMORPHONE HCL 1 MG/ML IJ SOLN: 0.2500 mg | INTRAMUSCULAR | Status: DC | PRN

## 2016-02-20 MED ORDER — DIBUCAINE 1 % RE OINT: 1.0000 "application " | TOPICAL_OINTMENT | RECTAL | Status: DC | PRN

## 2016-02-20 MED ORDER — BUPIVACAINE IN DEXTROSE 0.75-8.25 % IT SOLN
INTRATHECAL | Status: DC | PRN
  Administered 2016-02-20: 1.5 mL via INTRATHECAL

## 2016-02-20 MED ORDER — DEXTROSE 5 % IV SOLN
3.0000 g | Freq: Once | INTRAVENOUS | Status: AC
  Administered 2016-02-20: 3 g via INTRAVENOUS
  Filled 2016-02-20: qty 3000

## 2016-02-20 SURGICAL SUPPLY — 32 items
BENZOIN TINCTURE PRP APPL 2/3 (GAUZE/BANDAGES/DRESSINGS) ×2 IMPLANT
CANISTER SUCT 3000ML PPV (MISCELLANEOUS) ×2 IMPLANT
CHLORAPREP W/TINT 26ML (MISCELLANEOUS) ×2 IMPLANT
CLOSURE STERI-STRIP 1/4X4 (GAUZE/BANDAGES/DRESSINGS) ×2 IMPLANT
DERMABOND ADVANCED (GAUZE/BANDAGES/DRESSINGS) ×1
DERMABOND ADVANCED .7 DNX12 (GAUZE/BANDAGES/DRESSINGS) ×1 IMPLANT
DRSG OPSITE POSTOP 4X10 (GAUZE/BANDAGES/DRESSINGS) ×2 IMPLANT
ELECT REM PT RETURN 9FT ADLT (ELECTROSURGICAL) ×2
ELECTRODE REM PT RTRN 9FT ADLT (ELECTROSURGICAL) ×1 IMPLANT
GLOVE BIOGEL PI IND STRL 7.0 (GLOVE) ×2 IMPLANT
GLOVE BIOGEL PI IND STRL 7.5 (GLOVE) ×1 IMPLANT
GLOVE BIOGEL PI INDICATOR 7.0 (GLOVE) ×2
GLOVE BIOGEL PI INDICATOR 7.5 (GLOVE) ×1
GLOVE SKINSENSE NS SZ7.0 (GLOVE) ×1
GLOVE SKINSENSE STRL SZ7.0 (GLOVE) ×1 IMPLANT
GOWN STRL REUS W/ TWL LRG LVL3 (GOWN DISPOSABLE) ×2 IMPLANT
GOWN STRL REUS W/ TWL XL LVL3 (GOWN DISPOSABLE) ×1 IMPLANT
GOWN STRL REUS W/TWL LRG LVL3 (GOWN DISPOSABLE) ×2
GOWN STRL REUS W/TWL XL LVL3 (GOWN DISPOSABLE) ×1
NS IRRIG 1000ML POUR BTL (IV SOLUTION) ×2 IMPLANT
PACK C SECTION WH (CUSTOM PROCEDURE TRAY) ×2 IMPLANT
PAD OB MATERNITY 4.3X12.25 (PERSONAL CARE ITEMS) ×2 IMPLANT
PAD PREP 24X48 CUFFED NSTRL (MISCELLANEOUS) ×2 IMPLANT
SPONGE LAP 18X18 X RAY DECT (DISPOSABLE) ×2 IMPLANT
SUT CHROMIC 1 CTX 36 (SUTURE) ×4 IMPLANT
SUT MON AB 4-0 PS1 27 (SUTURE) ×2 IMPLANT
SUT MON AB-0 CT1 36 (SUTURE) ×4 IMPLANT
SUT PLAIN 2 0 (SUTURE) ×1
SUT PLAIN ABS 2-0 CT1 27XMFL (SUTURE) ×1 IMPLANT
SUT VIC AB 0 CT1 36 (SUTURE) ×4 IMPLANT
SUT VIC AB 3-0 CT1 27 (SUTURE) ×1
SUT VIC AB 3-0 CT1 TAPERPNT 27 (SUTURE) ×1 IMPLANT

## 2016-02-20 NOTE — Anesthesia Postprocedure Evaluation (Signed)
Anesthesia Post Note  Patient: Gabrielle Roy  Procedure(s) Performed: Procedure(s) (LRB): REPEAT CESAREAN SECTION (N/A)  Patient location during evaluation: Mother Baby Anesthesia Type: Spinal Level of consciousness: awake and alert Pain management: pain level controlled Vital Signs Assessment: post-procedure vital signs reviewed and stable Respiratory status: spontaneous breathing and nonlabored ventilation Cardiovascular status: stable Postop Assessment: no headache, no backache, epidural receding, patient able to bend at knees, no signs of nausea or vomiting and adequate PO intake Anesthetic complications: no     Last Vitals:  Vitals:   02/20/16 1415 02/20/16 1453  BP: (!) 146/60 (!) 112/56  Pulse: 70 77  Resp: 19 18  Temp:  37.2 C    Last Pain:  Vitals:   02/20/16 1606  TempSrc:   PainSc: 7    Pain Goal: Patients Stated Pain Goal: 3 (02/20/16 0802)               Laban EmperorMalinova,Rashida Ladouceur Hristova

## 2016-02-20 NOTE — Lactation Note (Signed)
This note was copied from a baby's chart. Lactation Consultation Note  Patient Name: Gabrielle Roy ZOXWR'UToday's Date: 02/20/2016 Reason for consult: Initial assessment Baby at 3 hr of life. Mom has flat, soft, wide spaced breast with short nipples. Mom has a noticeable lingual frenulum. Baby was making some sucking noised and clicking at the breast. Baby has a noticable lingual frenulum with a posterior insertion point. With visual assessment, baby extended tongue well over gum ridge and lifted tongue to roof. Baby was sleepy at the breast. Demonstrated to mom how to keep baby feeding. Mom denies breast or nipple pain even though her nipple was very compressed coming out of baby's mouth. Demonstrated how to get a deeper latch. Mom reports bf 1wk with her older child and was then told to supplement. She continue to bf and formula feed for 383m until her milk "just dried up". She can easily express large drops of colostrum. Discussed baby behavior, feeding frequency, spoon feeding, baby belly size, voids, wt loss, breast changes, and nipple care. Mom was sleepy at this visit. She may benefit from a DEBP at a later time. Given lactation handouts. Aware of OP services and support group.     Maternal Data Has patient been taught Hand Expression?: Yes Does the patient have breastfeeding experience prior to this delivery?: Yes  Feeding Feeding Type: Breast Fed Length of feed: 15 min (still bf)  LATCH Score/Interventions Latch: Repeated attempts needed to sustain latch, nipple held in mouth throughout feeding, stimulation needed to elicit sucking reflex. Intervention(s): Assist with latch  Audible Swallowing: A few with stimulation Intervention(s): Alternate breast massage;Hand expression;Skin to skin  Type of Nipple: Everted at rest and after stimulation  Comfort (Breast/Nipple): Soft / non-tender     Hold (Positioning): Full assist, staff holds infant at breast  LATCH Score: 6  Lactation  Tools Discussed/Used WIC Program: No   Consult Status Consult Status: Follow-up Date: 02/21/16 Follow-up type: In-patient    Rulon Eisenmengerlizabeth E Caidin Heidenreich 02/20/2016, 4:09 PM

## 2016-02-20 NOTE — Transfer of Care (Signed)
Immediate Anesthesia Transfer of Care Note  Patient: Lavonia DraftsMelissa Tsosie  Procedure(s) Performed: Procedure(s): REPEAT CESAREAN SECTION (N/A)  Patient Location: PACU  Anesthesia Type:Spinal  Level of Consciousness: awake, alert  and oriented  Airway & Oxygen Therapy: Patient Spontanous Breathing  Post-op Assessment: Report given to RN and Post -op Vital signs reviewed and stable  Post vital signs: Reviewed and stable  Last Vitals:  Vitals:   02/20/16 0802  BP: 133/73  Pulse: (!) 111  Resp: 16  Temp: 36.7 C    Last Pain:  Vitals:   02/20/16 0802  TempSrc: Oral      Patients Stated Pain Goal: 3 (02/20/16 0802)  Complications: No apparent anesthesia complications

## 2016-02-20 NOTE — Lactation Note (Signed)
This note was copied from a baby's chart. Lactation Consultation Note  Patient Name: Gabrielle Roy RUEAV'WToday's Date: 02/20/2016 Reason for consult: Follow-up assessment Baby at 6 hr of life. Mom requesting help with latch. Switched baby to football on the R side. When the nipple is "tea cupped" baby latches easily. Demonstrated to FOB how to help mom latch baby. Mom was able to manually express a small drop of colostrum on the R side. It was noted at this visit that baby has a very high palate. Discussed baby behavior, feeding frequency, baby belly size, voids, and wt loss. Parents are aware of lactation services and support group. They will call as needed.     Maternal Data Has patient been taught Hand Expression?: Yes Does the patient have breastfeeding experience prior to this delivery?: Yes  Feeding Feeding Type: Breast Fed Length of feed: 30 min  LATCH Score/Interventions Latch: Grasps breast easily, tongue down, lips flanged, rhythmical sucking. Intervention(s): Adjust position;Assist with latch;Breast compression  Audible Swallowing: A few with stimulation Intervention(s): Skin to skin;Hand expression Intervention(s): Skin to skin;Hand expression;Alternate breast massage  Type of Nipple: Everted at rest and after stimulation  Comfort (Breast/Nipple): Soft / non-tender     Hold (Positioning): Full assist, staff holds infant at breast Intervention(s): Breastfeeding basics reviewed;Support Pillows;Position options;Skin to skin  LATCH Score: 7  Lactation Tools Discussed/Used WIC Program: No   Consult Status Consult Status: Follow-up Date: 02/21/16 Follow-up type: In-patient    Rulon Eisenmengerlizabeth E Anthonia Monger 02/20/2016, 6:41 PM

## 2016-02-20 NOTE — Anesthesia Preprocedure Evaluation (Signed)
Anesthesia Evaluation  Patient identified by MRN, date of birth, ID band Patient awake    Reviewed: Allergy & Precautions, H&P , NPO status , Patient's Chart, lab work & pertinent test results, Unable to perform ROS - Chart review only  Airway Mallampati: III  TM Distance: >3 FB Neck ROM: full    Dental no notable dental hx.    Pulmonary former smoker,    Pulmonary exam normal        Cardiovascular negative cardio ROS Normal cardiovascular exam     Neuro/Psych negative neurological ROS     GI/Hepatic negative GI ROS, Neg liver ROS,   Endo/Other  Morbid obesity  Renal/GU negative Renal ROS     Musculoskeletal   Abdominal (+) + obese,   Peds  Hematology negative hematology ROS (+)   Anesthesia Other Findings   Reproductive/Obstetrics (+) Pregnancy                             Anesthesia Physical Anesthesia Plan  ASA: III  Anesthesia Plan: Spinal   Post-op Pain Management:    Induction:   Airway Management Planned:   Additional Equipment:   Intra-op Plan:   Post-operative Plan:   Informed Consent: I have reviewed the patients History and Physical, chart, labs and discussed the procedure including the risks, benefits and alternatives for the proposed anesthesia with the patient or authorized representative who has indicated his/her understanding and acceptance.     Plan Discussed with: CRNA and Surgeon  Anesthesia Plan Comments:         Anesthesia Quick Evaluation

## 2016-02-20 NOTE — Transfer of Care (Incomplete)
Immediate Anesthesia Transfer of Care Note  Patient: Gabrielle Roy  Procedure(s) Performed: Procedure(s): REPEAT CESAREAN SECTION (N/A)  Patient Location: {PLACES; ANE POST:19477::"PACU"}  Anesthesia Type:{PROCEDURES; ANE POST ANESTHESIA TYPE:19480}  Level of Consciousness: {FINDINGS; ANE POST LEVEL OF CONSCIOUSNESS:19484}  Airway & Oxygen Therapy: {Exam; oxygen device:30095}  Post-op Assessment: {ASSESSMENT;POST-OP WUJWJX:91478}REPORT:18741}  Post vital signs: {DESC; ANE POST GNFAOZ:30865}VITALS:19483}  Last Vitals:  Vitals:   02/20/16 0802  BP: 133/73  Pulse: (!) 111  Resp: 16  Temp: 36.7 C    Last Pain:  Vitals:   02/20/16 0802  TempSrc: Oral      Patients Stated Pain Goal: 3 (02/20/16 0802)  Complications: {FINDINGS; ANE POST COMPLICATIONS:19485}

## 2016-02-20 NOTE — Anesthesia Procedure Notes (Signed)
Spinal  Patient location during procedure: OR Start time: 02/20/2016 11:39 AM End time: 02/20/2016 11:42 AM Staffing Anesthesiologist: Leilani AbleHATCHETT, Lamerle Jabs Performed: anesthesiologist  Preanesthetic Checklist Completed: patient identified, surgical consent, pre-op evaluation, timeout performed, IV checked, risks and benefits discussed and monitors and equipment checked Spinal Block Patient position: sitting Prep: site prepped and draped and DuraPrep Patient monitoring: heart rate, cardiac monitor, continuous pulse ox and blood pressure Approach: midline Location: L3-4 Injection technique: single-shot Needle Needle type: Sprotte  Needle gauge: 24 G Needle length: 9 cm Needle insertion depth: 9 cm Assessment Sensory level: T4

## 2016-02-20 NOTE — H&P (Signed)
Lavonia DraftsMelissa Ciaramitaro is a 25 y.o.G2P01001 IUP 38 1/7 weeks  female [facility] Pt denies any HA or blurry vision at present. Prenatal care has essentially been unremarkable to this point. See Prenatal record for additional information . OB History    Gravida Para Term Preterm AB Living   2 1   1   1    SAB TAB Ectopic Multiple Live Births           1     Past Medical History:  Diagnosis Date  . Kidney stone    Stent placed in Feb 2017  . PONV (postoperative nausea and vomiting)    nausea during last c section  . Supervision of normal pregnancy, antepartum 10/04/2015    [facility] transfer from Dr. Shawnie Ponsorn Prenatal Labs Dating  Blood type:    Genetic Screen 1 Screen:    AFP:     Quad:     NIPS: Antibody:  Anatomic US  Nml fetal anatomy @ 18 wks; low lying placenta > resolved @23  wks Rubella:   GTT Early:               Third trimester:  RPR:    Flu vaccine  Declined HBsAg:    TDaP vaccine                                               Rhogam: HIV:    Baby Food  Breastfeed                                            GBS: (For PCN allergy, check sensitivities) Contraception  Pap: Circumcision  n/a female ([facility] ultrasound)  Pediatrician   Support Person  [name] (husband)     Past Surgical History:  Procedure Laterality Date  . CESAREAN SECTION    . FINGER SURGERY    . kidney stint     Family History: family history includes Hearing loss in her father; Heart disease in her father; Hypertension in her father. Social History:  reports that she has quit smoking. She has never used smokeless tobacco. She reports that she does not drink alcohol or use drugs.     Maternal Diabetes: No Genetic Screening: Declined Maternal Ultrasounds/Referrals: Normal Fetal Ultrasounds or other Referrals:  None Maternal Substance Abuse:  No Significant Maternal Medications:  None Significant Maternal Lab Results:  None Other Comments:  None  Review of Systems  Constitutional:  Negative.   HENT: Negative.   Eyes: Negative.   Respiratory: Negative.   Cardiovascular: Negative.   Gastrointestinal: Negative.   Genitourinary: Negative.    Maternal Medical History:  Fetal activity: Perceived fetal activity is normal.        Blood pressure 133/73, pulse (!) 111, temperature 98 F (36.7 C), temperature source Oral, resp. rate 16, SpO2 99 %. Exam Physical Exam  Constitutional: She appears well-developed and well-nourished.  Neck: Normal range of motion. Neck supple.  Cardiovascular: Normal rate and regular rhythm.   Respiratory: Effort normal and breath sounds normal.  GI: Soft. Bowel sounds are normal.  Musculoskeletal: She exhibits edema.    Prenatal labs: ABO, Rh: O/Positive/-- (04/27 0000) Antibody: Negative (04/27 0000) Rubella: Immune (04/27 0000) RPR: NON REAC (09/27  1006)  HBsAg: Negative (04/27 0000)  HIV: NONREACTIVE (09/27 1006)  GBS:     Assessment/Plan: IUP 38 1/7 weeks GHTN Prior c section desire for repeat Obesity  The risks of cesarean section discussed with the patient included but were not limited to: bleeding which may require transfusion or reoperation; infection which may require antibiotics; injury to bowel, bladder, ureters or other surrounding organs; injury to the fetus; need for additional procedures including hysterectomy in the event of a life-threatening hemorrhage; placental abnormalities wth subsequent pregnancies, incisional problems, thromboembolic phenomenon and other postoperative/anesthesia complications. The patient concurred with the proposed plan, giving informed written consent for the procedure.   Anesthesia and OR aware. Preoperative prophylactic antibiotics and SCDs ordered on call to the OR.  To OR when ready.  Gaylin Bulthuis L. Alysia PennaErvin, MD     Hermina StaggersMichael L Ulric Salzman 02/20/2016, 9:09 AM

## 2016-02-20 NOTE — Op Note (Signed)
Cesarean Section Procedure Note  02/20/2016  3:29 PM  PATIENT:  Gabrielle Roy  25 y.o. female  PRE-OPERATIVE DIAGNOSIS:  DIABETES, HYPERTENSION  POST-OPERATIVE DIAGNOSIS:  DIABETES, HYPERTENSION  PROCEDURE:  Procedure(s): REPEAT CESAREAN SECTION (N/A)  SURGEON:  Surgeon(s) and Role:    * Hermina StaggersMichael L Ervin, MD - Primary    * Lorne SkeensNicholas Michael Schenk, MD - Assisting  ASSISTANTS: none   ANESTHESIA:   spinal  EBL:  Total I/O In: 3000 [I.V.:3000] Out: 875 [Urine:75; Blood:800]  BLOOD ADMINISTERED:none  DRAINS: none   LOCAL MEDICATIONS USED:  MARCAINE     SPECIMEN:  No Specimen  DISPOSITION OF SPECIMEN:  N/A   Procedure Details   The patient was seen in the Holding Room. The risks, benefits, complications, treatment options, and expected outcomes were discussed with the patient.  The patient concurred with the proposed plan, giving informed consent.  The site of surgery properly noted/marked. The patient was taken to Operating Room # 9, identified as Gabrielle Roy and the procedure verified as C-Section Delivery. A Time Out was held and the above information confirmed.  After induction of anesthesia, the patient was draped and prepped in the usual sterile manner. A Pfannenstiel incision was made and carried down through the subcutaneous tissue to the fascia. Fascial incision was made and extended transversely. The fascia was separated from the underlying rectus tissue superiorly and inferiorly. The peritoneum was identified and entered. Peritoneal incision was extended longitudinally. The utero-vesical peritoneal reflection was incised transversely and the bladder flap was bluntly freed from the lower uterine segment. A low transverse uterine incision was made. Delivered from cephalic presentation was a 4040 gram Female with Apgar scores of 8 at one minute and 9 at five minutes. After the umbilical cord was clamped and cut cord blood was obtained for evaluation. The placenta was  removed intact and appeared normal. The uterine outline, tubes and ovaries appeared normal. The uterine incision was closed with running locked sutures of Chromic. An imbricating layer of the same was placed. Severl figure of eights with chromic where placed to obtain hemostasis. Lavage was carried out until clear. The fascia was then reapproximated with running sutures of Vicryl. The skin was reapproximated with 3.0 and Vicryl.  Instrument, sponge, and needle counts were correct prior the abdominal closure and at the conclusion of the case.   Complications:  None; patient tolerated the procedure well.  COUNTS:  YES  PLAN OF CARE: Admit to inpatient   PATIENT DISPOSITION:  PACU - hemodynamically stable.   Delay start of Pharmacological VTE agent (>24hrs) due to surgical blood loss or risk of bleeding: not applicable             Disposition: PACU - hemodynamically stable.         Condition: stable   Lorne SkeensNicholas Michael Schenk, MD 02/20/2016 3:29 PM

## 2016-02-21 LAB — CBC
HEMATOCRIT: 30.1 % — AB (ref 36.0–46.0)
Hemoglobin: 10.5 g/dL — ABNORMAL LOW (ref 12.0–15.0)
MCH: 33.7 pg (ref 26.0–34.0)
MCHC: 34.9 g/dL (ref 30.0–36.0)
MCV: 96.5 fL (ref 78.0–100.0)
Platelets: 198 10*3/uL (ref 150–400)
RBC: 3.12 MIL/uL — ABNORMAL LOW (ref 3.87–5.11)
RDW: 13.7 % (ref 11.5–15.5)
WBC: 15.2 10*3/uL — ABNORMAL HIGH (ref 4.0–10.5)

## 2016-02-21 LAB — BIRTH TISSUE RECOVERY COLLECTION (PLACENTA DONATION)

## 2016-02-21 MED ORDER — IBUPROFEN 600 MG PO TABS
600.0000 mg | ORAL_TABLET | Freq: Four times a day (QID) | ORAL | Status: DC
  Administered 2016-02-21 – 2016-02-22 (×3): 600 mg via ORAL
  Filled 2016-02-21 (×4): qty 1

## 2016-02-21 NOTE — Progress Notes (Signed)
CSW met with MOB to offer support and complete assessment due to hx of Anx/Dep.  MOB was quiet, but pleasant and welcoming of CSW's visit.  MOB's best friend and husband were present and she stated that this was a good time to talk with her and that we could talk openly with her company in the room. MOB reports that she and baby are doing well.  She states this is their second child, and that they have a 60.24 year old daughter/Roslynn at home.  She states Roslynn has been here and seems excited about her sister. 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  She states she has hx of Anx/Dep prior to the postpartum period as well and states her depression is mostly related to weight issues, which were exacerbated after pregnancy.  She states she went to see a bariatric specialist and met with a counselor in his office, whom she found very helpful and supportive.  She does not feel she needs medication at this time and feels she is coping well at this time, but also thinks she will return to this practice for continued support now that she has had another baby. CSW provided education regarding perinatal mood disorders and stressed the importance of talking with a medical professional if she notes emotional concerns at any time.  MOB agreed.  CSW provided MOB with a "New Mom Checklist" as a way to self-evaluate and informed her of the support group "Mom Talk" at [facility] seemed appreciative of CSW's concern for her emotional wellbeing.  FOB states he is feeling well and admits that he cried after his first child was born, but is not feeling emotional at this time.  CSW normalized his experiences and encourages open communication between parents.   CSW notes no further interventions needed and no barriers to discharge when MOB and baby are medically ready.

## 2016-02-21 NOTE — Lactation Note (Signed)
This note was copied from a baby's chart. Lactation Consultation Note Called to room d/t difficulty latching and maintining a latch. Mom has pendulum soft loose skin to breast. Ducts noteable in breast. Breast massaged. Nipple to end of breast, very compressible and should be able to obtain a deep latch. Baby is the one who hasn't figured out how to maintain the latch. Discussed latching and obtaining a deep latch. Hand expressed 3 ml colostrum. Gave to baby w/gloved finger and syring. Noted clicking d/t suction break while sucking. Baby tends not to extend tongue out. Suck training w/gloved finger.  Mom had BF before LC came for consult. RN latched baby w/tea-cup hold. Baby laying on mom's chest when entered rm. Baby appeared to be satisfied. Baby had 1st stool while in the rm.  Mom shown how to use DEBP & how to disassemble, clean, & reassemble parts. Mom knows to pump q3h for 15-20 min.  Asked mom to call for next feeding if she has trouble latching.  Patient Name: Gabrielle Roy [id number]'s Date: 02/21/2016 Reason for consult: Follow-up assessment;Difficult latch   Maternal Data    Feeding Feeding Type: Breast Milk Length of feed: 0 min (crying; won't latch)  LATCH Score/Interventions Latch: Grasps breast easily, tongue down, lips flanged, rhythmical sucking. Intervention(s): Adjust position;Assist with latch;Breast massage;Breast compression  Audible Swallowing: Spontaneous and intermittent Intervention(s): Hand expression  Type of Nipple: Everted at rest and after stimulation  Comfort (Breast/Nipple): Soft / non-tender     Hold (Positioning): Full assist, staff holds infant at breast Intervention(s): Breastfeeding basics reviewed;Support Pillows;Position options;Skin to skin  LATCH Score: 8  Lactation Tools Discussed/Used Tools: Pump;Shells Shell Type: Inverted Breast pump type: Double-Electric Breast Pump Pump Review: Setup, frequency, and cleaning;Milk  Storage Initiated by:: Peri JeffersonL. Branna Cortina RN IBCLC Date initiated:: 02/21/16   Consult Status Consult Status: Follow-up Date: 02/22/16 Follow-up type: In-patient    Deshana Rominger, Diamond NickelLAURA G 02/21/2016, 1:24 AM

## 2016-02-21 NOTE — Progress Notes (Signed)
Post-Op Day 1, RLTCS  Subjective: No complaints, up ad lib, voiding and tolerating PO, passing flatus,small lochia,.plans to breastfeed, oral progesterone-only contraceptive  Objective: Blood pressure (!) 106/45, pulse 80, temperature 97.9 F (36.6 C), temperature source Oral, resp. rate 18, height 5\' 3"  (1.6 m), weight 135.2 kg (298 lb), SpO2 98 %, unknown if currently breastfeeding.  Physical Exam:  General: alert, cooperative and no distress Lochia:normal flow Chest: CTAB Heart: RRR no m/r/g Abdomen: +BS, soft, nontender, incision c/d/intact Uterine Fundus: firm DVT Evaluation: No evidence of DVT seen on physical exam. Extremities: trace edema   Recent Labs  02/20/16 0750 02/21/16 0535  HGB 12.8 10.5*  HCT 36.3 30.1*    Assessment/Plan: Plan for discharge tomorrow   LOS: 1 day   CRESENZO-DISHMAN,Jamerson Vonbargen 02/21/2016, 7:48 AM

## 2016-02-22 DIAGNOSIS — O139 Gestational [pregnancy-induced] hypertension without significant proteinuria, unspecified trimester: Secondary | ICD-10-CM | POA: Diagnosis present

## 2016-02-22 MED ORDER — OXYCODONE HCL 5 MG PO TABS: 5.0000 mg | ORAL_TABLET | ORAL | 0 refills | Status: DC | PRN

## 2016-02-22 MED ORDER — ACETAMINOPHEN 325 MG PO TABS: 650.0000 mg | ORAL_TABLET | ORAL | 0 refills | Status: DC | PRN

## 2016-02-22 MED ORDER — IBUPROFEN 600 MG PO TABS: 600.0000 mg | ORAL_TABLET | Freq: Four times a day (QID) | ORAL | 0 refills | Status: DC

## 2016-02-22 MED ORDER — SENNOSIDES-DOCUSATE SODIUM 8.6-50 MG PO TABS: 2.0000 | ORAL_TABLET | ORAL | 0 refills | Status: DC

## 2016-02-22 NOTE — Anesthesia Postprocedure Evaluation (Signed)
Anesthesia Post Note  Patient: Gabrielle Roy  Procedure(s) Performed: Procedure(s) (LRB): REPEAT CESAREAN SECTION (N/A)  Patient location during evaluation: PACU Anesthesia Type: Spinal Level of consciousness: awake Pain management: pain level controlled Vital Signs Assessment: post-procedure vital signs reviewed and stable Respiratory status: spontaneous breathing Cardiovascular status: stable Postop Assessment: no headache, no backache, spinal receding, patient able to bend at knees and no signs of nausea or vomiting Anesthetic complications: no     Last Vitals:  Vitals:   02/21/16 1000 02/21/16 1740  BP: (!) 97/54 (!) 119/59  Pulse: 92 77  Resp: 18 18  Temp: 37.1 C 36.8 C    Last Pain:  Vitals:   02/22/16 0018  TempSrc:   PainSc: 7    Pain Goal: Patients Stated Pain Goal: 3 (02/21/16 0610)               Darrold Bezek JR,JOHN Susann GivensFRANKLIN

## 2016-02-22 NOTE — Discharge Instructions (Signed)

## 2016-02-22 NOTE — Discharge Summary (Signed)
OB Discharge Summary     Patient Name: Gabrielle DraftsMelissa Fife DOB: 07/25/1990 MRN: [id number]  Date of admission: 02/20/2016 Delivering MD: Hermina StaggersERVIN, MICHAEL L   Date of discharge: 02/22/2016  Admitting diagnosis: cpt [id number] - REPEAT, gestational hypertension Intrauterine pregnancy: 7014w2d     Secondary diagnosis:  none      Discharge diagnosis: Term Pregnancy Delivered and Gestational Hypertension                                                                                                Post partum procedures:None  Augmentation: none  Complications: None  Hospital course:  Sceduled C/S   25 y.o. yo G2P1102 at 4614w2d was admitted to the hospital 02/20/2016 for scheduled cesarean section with the following indication:repeat with gestational hypertension.  Membrane Rupture Time/Date:   ,02/20/2016   Patient delivered a Viable infant.02/20/2016  Details of operation can be found in separate operative note.  Pateint had an uncomplicated postpartum course.  She is ambulating, tolerating a regular diet, passing flatus, and urinating well. Patient is discharged home in stable condition on  02/22/16          Physical exam  Vitals:   02/21/16 0610 02/21/16 1000 02/21/16 1740 02/22/16 0602  BP: (!) 106/45 (!) 97/54 (!) 119/59 116/61  Pulse: 80 92 77 81  Resp: 18 18 18 18   Temp: 97.9 F (36.6 C) 98.7 F (37.1 C) 98.3 F (36.8 C) 97.8 F (36.6 C)  TempSrc: Oral Oral Oral Oral  SpO2: 98% 99%    Weight:      Height:       General: alert, cooperative and no distress Lochia: appropriate Uterine Fundus: firm Incision: minimal serosanginous discharge on dressing DVT Evaluation: No evidence of DVT seen on physical exam. Labs: Lab Results  Component Value Date   WBC 15.2 (H) 02/21/2016   HGB 10.5 (L) 02/21/2016   HCT 30.1 (L) 02/21/2016   MCV 96.5 02/21/2016   PLT 198 02/21/2016   CMP Latest Ref Rng & Units 02/19/2016  Glucose 65 - 99 mg/dL 84  BUN 7 - 25 mg/dL 7  Creatinine 6.290.50  - 5.281.10 mg/dL 4.13(K0.48(L)  Sodium 440135 - 102146 mmol/L 135  Potassium 3.5 - 5.3 mmol/L 4.0  Chloride 98 - 110 mmol/L 104  CO2 20 - 31 mmol/L 24  Calcium 8.6 - 10.2 mg/dL 8.7  Total Protein 6.1 - 8.1 g/dL 7.2(Z5.4(L)  Total Bilirubin 0.2 - 1.2 mg/dL 0.7  Alkaline Phos 33 - 115 U/L 134(H)  AST 10 - 30 U/L 11  ALT 6 - 29 U/L 7    Discharge instruction: per After Visit Summary and "Baby and Me Booklet".  After visit meds:  Allergies as of 02/22/2016      Reactions   Alcohol Hives   Allergic to alcohol containing products including cough medicines that contain alcohol   Flagyl [metronidazole] Other (See Comments)   Facial flushing   Hydrocodone-acetaminophen Nausea And Vomiting      Medication List    STOP taking these medications   calcium carbonate 500 MG chewable tablet Commonly known as:  TUMS -  dosed in mg elemental calcium   ondansetron 4 MG tablet Commonly known as:  ZOFRAN     TAKE these medications   acetaminophen 325 MG tablet Commonly known as:  TYLENOL Take 2 tablets (650 mg total) by mouth every 4 (four) hours as needed (for pain scale < 4). What changed:  how much to take  when to take this  reasons to take this   ibuprofen 600 MG tablet Commonly known as:  ADVIL,MOTRIN Take 1 tablet (600 mg total) by mouth every 6 (six) hours.   oxyCODONE 5 MG immediate release tablet Commonly known as:  Oxy IR/ROXICODONE Take 1 tablet (5 mg total) by mouth every 4 (four) hours as needed (pain scale 4-7).   senna-docusate 8.6-50 MG tablet Commonly known as:  Senokot-S Take 2 tablets by mouth daily. Start taking on:  02/23/2016       Diet: routine diet  Activity: Advance as tolerated. Pelvic rest for 6 weeks.   Outpatient follow up:1 week for dressing and bp check Follow up Appt:No future appointments. Follow up Visit:No Follow-up on file.  Postpartum contraception: Combination OCPs  Newborn Data: Live born female  Birth Weight: 8 lb 14.5 oz (4040 g) APGAR: 8,  9  Baby Feeding: Breast Disposition:home with mother   02/22/2016 Ernestina PennaNicholas Gesselle Fitzsimons, MD

## 2016-02-24 ENCOUNTER — Encounter (HOSPITAL_COMMUNITY)
Admission: RE | Admit: 2016-02-24 | Discharge: 2016-02-24 | Disposition: A | Discharge status: Home / Self Care | Payer: Medicaid Other | Source: Ambulatory Visit

## 2016-02-24 LAB — TYPE AND SCREEN
BLOOD PRODUCT EXPIRATION DATE: 201712272359
BLOOD PRODUCT EXPIRATION DATE: 201801092359
Blood Product Expiration Date: 201712242359
Blood Product Expiration Date: 201801082359
ISSUE DATE / TIME: 201712170914
UNIT TYPE AND RH: 5100
Unit Type and Rh: 5100
Unit Type and Rh: 5100
Unit Type and Rh: 5100

## 2016-02-26 ENCOUNTER — Ambulatory Visit (HOSPITAL_COMMUNITY): Payer: Medicaid Other

## 2016-02-27 ENCOUNTER — Encounter: Payer: Self-pay | Admitting: Obstetrics & Gynecology

## 2016-02-27 ENCOUNTER — Ambulatory Visit (INDEPENDENT_AMBULATORY_CARE_PROVIDER_SITE_OTHER): Payer: Medicaid Other | Admitting: Obstetrics & Gynecology

## 2016-02-27 VITALS — BP 138/93 | HR 93 | Ht 63.0 in | Wt 288.0 lb

## 2016-02-27 DIAGNOSIS — M7989 Other specified soft tissue disorders: Secondary | ICD-10-CM

## 2016-02-27 MED ORDER — HYDROCHLOROTHIAZIDE 12.5 MG PO TABS: 12.5000 mg | ORAL_TABLET | Freq: Every day | ORAL | 0 refills | Status: DC

## 2016-02-27 NOTE — Progress Notes (Signed)
   Subjective:    Patient ID: Gabrielle Roy, female    DOB: 01/22/1991, 25 y.o.   MRN: [id number]  HPI  Pt c/o selling in legts, knees, hips.  No leg pain or redness   Review of Systems  Constitutional: Negative.   Eyes: Negative for visual disturbance.  Gastrointestinal: Negative.   Genitourinary: Negative.   Neurological: Negative for headaches.       Objective:   Physical Exam  Constitutional: She is oriented to person, place, and time. She appears well-developed and well-nourished. No distress.  HENT:  Head: Normocephalic and atraumatic.  Eyes: Conjunctivae are normal.  Pulmonary/Chest: Effort normal.  Abdominal: Soft. She exhibits no mass. There is no tenderness. There is no rebound and no guarding.  Incision well healed.  Musculoskeletal: She exhibits edema.  Neurological: She is alert and oriented to person, place, and time.  Skin: Skin is warm and dry.  Psychiatric: She has a normal mood and affect.  Vitals reviewed.   Assessment & Plan:  25 yo female with LE swelling No evidence of DVT HCTZ for swelling Wound healing well.  Keep clean and dry.

## 2016-04-02 ENCOUNTER — Encounter: Payer: Self-pay | Admitting: Obstetrics & Gynecology

## 2016-04-02 ENCOUNTER — Other Ambulatory Visit (HOSPITAL_COMMUNITY)
Admission: RE | Admit: 2016-04-02 | Discharge: 2016-04-02 | Disposition: A | Discharge status: Home / Self Care | Payer: Medicaid Other | Source: Ambulatory Visit | Attending: Obstetrics & Gynecology | Admitting: Obstetrics & Gynecology

## 2016-04-02 ENCOUNTER — Ambulatory Visit (INDEPENDENT_AMBULATORY_CARE_PROVIDER_SITE_OTHER): Payer: Medicaid Other | Admitting: Obstetrics & Gynecology

## 2016-04-02 VITALS — BP 124/76 | Ht 62.0 in | Wt 273.0 lb

## 2016-04-02 DIAGNOSIS — Z0000 Encounter for general adult medical examination without abnormal findings: Secondary | ICD-10-CM

## 2016-04-02 DIAGNOSIS — F3289 Other specified depressive episodes: Secondary | ICD-10-CM

## 2016-04-02 DIAGNOSIS — Z01419 Encounter for gynecological examination (general) (routine) without abnormal findings: Secondary | ICD-10-CM | POA: Insufficient documentation

## 2016-04-02 MED ORDER — FLUOXETINE HCL 20 MG PO TABS: 20.0000 mg | ORAL_TABLET | Freq: Every day | ORAL | 9 refills | Status: DC

## 2016-04-02 MED ORDER — NORETHINDRONE 0.35 MG PO TABS: 1.0000 | ORAL_TABLET | Freq: Every day | ORAL | 11 refills | Status: DC

## 2016-04-02 NOTE — Progress Notes (Signed)
Post Partum Exam  Gabrielle DraftsMelissa Salahuddin is a 26 y.o. 262P1102 female who presents for a postpartum visit. She is 6 weeks postpartum following a C-Section. I have fully reviewed the prenatal and intrapartum course. The delivery was at 38 gestational weeks.  Anesthesia:spinal. Postpartum course has been unremarkable. Baby's course has been unremarkable. Baby is feeding by bottle. Bleeding: no. Bowel function is normal. Bladder function is normal. Patient is not sexually active. Contraception method is OCP, although she hasn't started yet. Postpartum depression screening: score 11. She feels very tired, but no HI or SI. She has been treated with prozac in the past and would like to restart it. She saw Richmond CampbellJaimie 7/17 but her pregnancy medicaid is running out so she can't see Richmond CampbellJaimie now. The following portions of the patient's history were reviewed and updated as appropriate: allergies, current medications, past family history, past medical history, past social history, past surgical history and problem list.  Review of Systems Pertinent items are noted in HPI. Pertinent items noted in HPI and remainder of comprehensive ROS otherwise negative.    Objective:    BP 116/78 mmHg  Pulse 78  Resp 16  Ht 5\' 5"  (1.651 m)  Wt 211 lb (95.709 kg)  BMI 35.11 kg/m2  Breastfeeding? Yes  General:  alert   Breasts:  inspection negative, no nipple discharge or bleeding, no masses or nodularity palpable  Lungs: clear to auscultation bilaterally  Heart:  regular rate and rhythm, S1, S2 normal, no murmur, click, rub or gallop  Abdomen: soft, non-tender; bowel sounds normal; no masses,  no organomegaly   Vulva:  normal  Vagina: normal vagina  Cervix:  anteverted  Corpus: normal  Adnexa:  normal adnexa  Rectal Exam: Not performed.        Assessment:    Normal postpartum exam. Pap smear not done at today's visit.   Plan:   1. Contraception: oral progesterone-only contraceptive, discussed the particulars of this pill,  including the 80% effectiveness rate. 2. Start prozac now and follow up with her FP in a month. 3. Follow up in: 1 year or as needed.

## 2016-04-06 LAB — CYTOLOGY - PAP: DIAGNOSIS: NEGATIVE

## 2017-05-06 DIAGNOSIS — F17200 Nicotine dependence, unspecified, uncomplicated: Secondary | ICD-10-CM | POA: Insufficient documentation

## 2017-05-06 DIAGNOSIS — Z87891 Personal history of nicotine dependence: Secondary | ICD-10-CM | POA: Insufficient documentation

## 2017-06-07 DIAGNOSIS — Z9884 Bariatric surgery status: Secondary | ICD-10-CM | POA: Insufficient documentation

## 2017-06-07 DIAGNOSIS — K912 Postsurgical malabsorption, not elsewhere classified: Secondary | ICD-10-CM | POA: Insufficient documentation

## 2017-09-06 ENCOUNTER — Ambulatory Visit (INDEPENDENT_AMBULATORY_CARE_PROVIDER_SITE_OTHER): Payer: Self-pay | Admitting: Obstetrics & Gynecology

## 2017-09-06 ENCOUNTER — Encounter: Payer: Self-pay | Admitting: Obstetrics & Gynecology

## 2017-09-06 VITALS — BP 126/82 | Ht 61.0 in | Wt 226.0 lb

## 2017-09-06 DIAGNOSIS — N9089 Other specified noninflammatory disorders of vulva and perineum: Secondary | ICD-10-CM

## 2017-09-06 DIAGNOSIS — N898 Other specified noninflammatory disorders of vagina: Secondary | ICD-10-CM

## 2017-09-06 DIAGNOSIS — R102 Pelvic and perineal pain unspecified side: Secondary | ICD-10-CM

## 2017-09-06 NOTE — Progress Notes (Signed)
   Subjective:    Patient ID: Gabrielle Roy, female    DOB: 11/06/1990, 27 y.o.   MRN: [id number]  HPI 27 yo married P2 here with a 1 week h/o a very painful area on her left labia minora. She has had "boils" on her vulva in the past but nothing this painful. She denies a h/o herpes.She has tried [employer] but that has not made it any better. Nothing makes it worse.  She is also worried that she may have a yeast infection.   Review of Systems Pap normal 1/18    Objective:   Physical Exam Breathing, conversing, and ambulating normally Well nourished, well hydrated White female, no apparent distress Small raised ulceration on her left labia minora- c/w HSV She opts to do a self swab for testing for yeast     Assessment & Plan:  Vulvar lesion- check hsv2 IgG Self swab

## 2017-09-06 NOTE — Addendum Note (Signed)
Addended by: Kathie DikeSOLA, DEANNA J on: 09/06/2017 04:05 PM   Modules accepted: Orders

## 2017-09-06 NOTE — Progress Notes (Signed)
Vaginal boil has been there for a week. Pt c/o yeast infection

## 2017-09-07 ENCOUNTER — Telehealth: Payer: Self-pay

## 2017-09-07 LAB — WET PREP FOR TRICH, YEAST, CLUE
MICRO NUMBER:: 90782169
Specimen Quality: ADEQUATE

## 2017-09-07 LAB — HEMOGLOBIN A1C
Hgb A1c MFr Bld: 4.7 % of total Hgb (ref <5.7)
Mean Plasma Glucose: 88 (calc)
eAG (mmol/L): 4.9 (calc)

## 2017-09-07 LAB — HSV 2 ANTIBODY, IGG: HSV 2 Glycoprotein G Ab, IgG: <0.9 index

## 2017-09-07 NOTE — Telephone Encounter (Addendum)
Spoke with pt about wet prep results that state "clue cells seen". She is allergic to Metronidazole so I told her I would need to speak with Dr.Dove about . I asked Dr.Dove about Clindamycin. After speaking with Dr.Dove she stated to not send anything in. Pt is aware.

## 2017-09-08 ENCOUNTER — Telehealth: Payer: Self-pay

## 2017-09-08 NOTE — Telephone Encounter (Signed)
Pt left a message wanting results from blood work (HSV and HGB A1c). I spoke with pt and she is aware HSV 2 was negative. She is also aware that her HGB A1C  was 4.7.

## 2017-09-13 ENCOUNTER — Encounter: Payer: Self-pay | Admitting: Obstetrics & Gynecology

## 2017-11-19 DIAGNOSIS — Z3491 Encounter for supervision of normal pregnancy, unspecified, first trimester: Secondary | ICD-10-CM

## 2017-12-29 ENCOUNTER — Ambulatory Visit (INDEPENDENT_AMBULATORY_CARE_PROVIDER_SITE_OTHER): Payer: Managed Care, Other (non HMO) | Admitting: Obstetrics and Gynecology

## 2017-12-29 ENCOUNTER — Encounter: Payer: Self-pay | Admitting: Obstetrics and Gynecology

## 2017-12-29 ENCOUNTER — Ambulatory Visit: Payer: Managed Care, Other (non HMO) | Admitting: Obstetrics and Gynecology

## 2017-12-29 VITALS — BP 118/77 | HR 63 | Wt 193.0 lb

## 2017-12-29 VITALS — BP 118/77 | HR 63 | Resp 16 | Ht 62.0 in | Wt 193.0 lb

## 2017-12-29 DIAGNOSIS — Z3687 Encounter for antenatal screening for uncertain dates: Secondary | ICD-10-CM

## 2017-12-29 DIAGNOSIS — Z3481 Encounter for supervision of other normal pregnancy, first trimester: Secondary | ICD-10-CM | POA: Diagnosis not present

## 2017-12-29 DIAGNOSIS — Z9884 Bariatric surgery status: Secondary | ICD-10-CM

## 2017-12-29 DIAGNOSIS — O34219 Maternal care for unspecified type scar from previous cesarean delivery: Secondary | ICD-10-CM

## 2017-12-29 DIAGNOSIS — Z8759 Personal history of other complications of pregnancy, childbirth and the puerperium: Secondary | ICD-10-CM

## 2017-12-29 DIAGNOSIS — Z3480 Encounter for supervision of other normal pregnancy, unspecified trimester: Secondary | ICD-10-CM

## 2017-12-29 NOTE — Progress Notes (Addendum)
Subjective:    Gabrielle Roy is a [id number] [redacted]w[redacted]d being seen today for her first obstetrical visit.  Her obstetrical history is significant for previous c-section x 2, GHTN with last pregnancy and recent gastric sleeve. Patient does intend to breast feed. Pregnancy history fully reviewed.  Patient reports heavy vaginal bleeding yesterday evening which is not reduced to brown spotting.  Vitals:   12/29/17 1402  BP: 118/77  Pulse: 63  Weight: 193 lb (87.5 kg)    HISTORY: OB History  Gravida Para Term Preterm AB Living  3 2 1 1   2   SAB TAB Ectopic Multiple Live Births        0 2    # Outcome Date GA Lbr Len/2nd Weight Sex Delivery Anes PTL Lv  3 Current           2 Term 02/20/16 [redacted]w[redacted]d  8 lb 14.5 oz (4.04 kg) F CS-LTranv Spinal  LIV  1 Preterm 07/20/12 [redacted]w[redacted]d  5 lb (2.268 kg) F CS-LTranv  N LIV     Complications: Fetal Intolerance, Oligohydramnios antepartum   Past Medical History:  Diagnosis Date  . Kidney stone    Stent placed in Feb 2017  . PONV (postoperative nausea and vomiting)    nausea during last c section  . Supervision of normal pregnancy, antepartum 10/04/2015    Clinic  [facility] - transfer from Dr. Shawnie Pons Prenatal Labs Dating  Blood type:    Genetic Screen 1 Screen:    AFP:     Quad:     NIPS: Antibody:  Anatomic [place]  Nml fetal anatomy @ 18 wks; low lying placenta > resolved @23  wks Rubella:   GTT Early:               Third trimester:  RPR:    Flu vaccine  Declined HBsAg:    TDaP vaccine                                               Rhogam: HIV:    Baby Food  Breastfeed                                            GBS: (For PCN allergy, check sensitivities) Contraception  Pap: Circumcision  n/a female ([employer] [facility] ultrasound)  Pediatrician   Support Person  [name] (husband)     Past Surgical History:  Procedure Laterality Date  . CESAREAN SECTION    . CESAREAN SECTION N/A 02/20/2016   Procedure: REPEAT CESAREAN SECTION;  Surgeon: Hermina Staggers, MD;  Location: [facility] BIRTHING  SUIT[facility];  Service: Obstetrics;  Laterality: N/A;  . FINGER SURGERY    . GASTRIC BYPASS    . kidney stint     Family History  Problem Relation Age of Onset  . Hypertension Father   . Heart disease Father   . Hearing loss Father      Exam    Uterus:   10-weeks size  Pelvic Exam:    Perineum: No Hemorrhoids, Normal Perineum   Vulva: normal   Vagina:  normal mucosa, normal discharge   pH:    Cervix: nulliparous appearance and closed and long. Brown discharge in vaginal vault. No active bleeding from cervical os   Adnexa: normal adnexa  and no mass, fullness, tenderness   Bony Pelvis: gynecoid  System: Breast:  normal appearance, no masses or tenderness   Skin: normal coloration and turgor, no rashes    Neurologic: oriented, no focal deficits   Extremities: normal strength, tone, and muscle mass   HEENT extra ocular movement intact   Mouth/Teeth mucous membranes moist, pharynx normal without lesions and dental hygiene good   Neck supple and no masses   Cardiovascular: regular rate and rhythm   Respiratory:  appears well, vitals normal, no respiratory distress, acyanotic, normal RR, chest clear, no wheezing, crepitations, rhonchi, normal symmetric air entry   Abdomen: soft, non-tender; bowel sounds normal; no masses,  no organomegaly   Urinary:       Assessment:    Pregnancy: [id number] Patient Active Problem List   Diagnosis Date Noted  . History of gestational hypertension 12/29/2017  . 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 Previous cesarean section complicating pregnancy, antepartum condition or complication 12/29/2017  . Gestational hypertension 02/22/2016  . Supervision of normal pregnancy, antepartum 10/04/2015  . Anxiety in pregnancy, antepartum 10/04/2015  . Depressive disorder, not elsewhere classified 09/29/2013        Plan:     Initial labs drawn. Prenatal vitamins. Problem list reviewed and updated. Genetic Screening discussed : Panorama  ordered.  Ultrasound discussed; fetal survey: requested. Patient with history of GHTN and recent gastric bypass- cannot take ASA Baseline labs ordered Patient with 2 previous c-section- will be scheduled for repeat at 39 weeks  Follow up in 4 weeks. 50% of 30 min visit spent on counseling and coordination of care.     Gabrielle Roy 12/29/2017

## 2017-12-29 NOTE — Addendum Note (Signed)
Addended by: Granville Lewis on: 12/29/2017 02:35 PM   Modules accepted: Orders

## 2017-12-29 NOTE — Patient Instructions (Signed)
 First Trimester of Pregnancy The first trimester of pregnancy is from week 1 until the end of week 13 (months 1 through 3). A week after a sperm fertilizes an egg, the egg will implant on the wall of the uterus. This embryo will begin to develop into a baby. Genes from you and your partner will form the baby. The female genes will determine whether the baby will be a boy or a girl. At 6-8 weeks, the eyes and face will be formed, and the heartbeat can be seen on ultrasound. At the end of 12 weeks, all the baby's organs will be formed. Now that you are pregnant, you will want to do everything you can to have a healthy baby. Two of the most important things are to get good prenatal care and to follow your health care provider's instructions. Prenatal care is all the medical care you receive before the baby's birth. This care will help prevent, find, and treat any problems during the pregnancy and childbirth. Body changes during your first trimester Your body goes through many changes during pregnancy. The changes vary from woman to woman.  You may gain or lose a couple of pounds at first.  You may feel sick to your stomach (nauseous) and you may throw up (vomit). If the vomiting is uncontrollable, call your health care provider.  You may tire easily.  You may develop headaches that can be relieved by medicines. All medicines should be approved by your health care provider.  You may urinate more often. Painful urination may mean you have a bladder infection.  You may develop heartburn as a result of your pregnancy.  You may develop constipation because certain hormones are causing the muscles that push stool through your intestines to slow down.  You may develop hemorrhoids or swollen veins (varicose veins).  Your breasts may begin to grow larger and become tender. Your nipples may stick out more, and the tissue that surrounds them (areola) may become darker.  Your gums may bleed and may be  sensitive to brushing and flossing.  Dark spots or blotches (chloasma, mask of pregnancy) may develop on your face. This will likely fade after the baby is born.  Your menstrual periods will stop.  You may have a loss of appetite.  You may develop cravings for certain kinds of food.  You may have changes in your emotions from day to day, such as being excited to be pregnant or being concerned that something may go wrong with the pregnancy and baby.  You may have more vivid and strange dreams.  You may have changes in your hair. These can include thickening of your hair, rapid growth, and changes in texture. Some women also have hair loss during or after pregnancy, or hair that feels dry or thin. Your hair will most likely return to normal after your baby is born.  What to expect at prenatal visits During a routine prenatal visit:  You will be weighed to make sure you and the baby are growing normally.  Your blood pressure will be taken.  Your abdomen will be measured to track your baby's growth.  The fetal heartbeat will be listened to between weeks 10 and 14 of your pregnancy.  Test results from any previous visits will be discussed.  Your health care provider may ask you:  How you are feeling.  If you are feeling the baby move.  If you have had any abnormal symptoms, such as leaking fluid, bleeding, severe   headaches, or abdominal cramping.  If you are using any tobacco products, including cigarettes, chewing tobacco, and electronic cigarettes.  If you have any questions.  Other tests that may be performed during your first trimester include:  Blood tests to find your blood type and to check for the presence of any previous infections. The tests will also be used to check for low iron levels (anemia) and protein on red blood cells (Rh antibodies). Depending on your risk factors, or if you previously had diabetes during pregnancy, you may have tests to check for high blood  sugar that affects pregnant women (gestational diabetes).  Urine tests to check for infections, diabetes, or protein in the urine.  An ultrasound to confirm the proper growth and development of the baby.  Fetal screens for spinal cord problems (spina bifida) and Down syndrome.  HIV (human immunodeficiency virus) testing. Routine prenatal testing includes screening for HIV, unless you choose not to have this test.  You may need other tests to make sure you and the baby are doing well.  Follow these instructions at home: Medicines  Follow your health care provider's instructions regarding medicine use. Specific medicines may be either safe or unsafe to take during pregnancy.  Take a prenatal vitamin that contains at least 600 micrograms (mcg) of folic acid.  If you develop constipation, try taking a stool softener if your health care provider approves. Eating and drinking  Eat a balanced diet that includes fresh fruits and vegetables, whole grains, good sources of protein such as meat, eggs, or tofu, and low-fat dairy. Your health care provider will help you determine the amount of weight gain that is right for you.  Avoid raw meat and uncooked cheese. These carry germs that can cause birth defects in the baby.  Eating four or five small meals rather than three large meals a day may help relieve nausea and vomiting. If you start to feel nauseous, eating a few soda crackers can be helpful. Drinking liquids between meals, instead of during meals, also seems to help ease nausea and vomiting.  Limit foods that are high in fat and processed sugars, such as fried and sweet foods.  To prevent constipation: ? Eat foods that are high in fiber, such as fresh fruits and vegetables, whole grains, and beans. ? Drink enough fluid to keep your urine clear or pale yellow. Activity  Exercise only as directed by your health care provider. Most women can continue their usual exercise routine during  pregnancy. Try to exercise for 30 minutes at least 5 days a week. Exercising will help you: ? Control your weight. ? Stay in shape. ? Be prepared for labor and delivery.  Experiencing pain or cramping in the lower abdomen or lower back is a good sign that you should stop exercising. Check with your health care provider before continuing with normal exercises.  Try to avoid standing for long periods of time. Move your legs often if you must stand in one place for a long time.  Avoid heavy lifting.  Wear low-heeled shoes and practice good posture.  You may continue to have sex unless your health care provider tells you not to. Relieving pain and discomfort  Wear a good support bra to relieve breast tenderness.  Take warm sitz baths to soothe any pain or discomfort caused by hemorrhoids. Use hemorrhoid cream if your health care provider approves.  Rest with your legs elevated if you have leg cramps or low back pain.  If you   develop varicose veins in your legs, wear support hose. Elevate your feet for 15 minutes, 3-4 times a day. Limit salt in your diet. Prenatal care  Schedule your prenatal visits by the twelfth week of pregnancy. They are usually scheduled monthly at first, then more often in the last 2 months before delivery.  Write down your questions. Take them to your prenatal visits.  Keep all your prenatal visits as told by your health care provider. This is important. Safety  Wear your seat belt at all times when driving.  Make a list of emergency phone numbers, including numbers for family, friends, the hospital, and police and fire departments. General instructions  Ask your health care provider for a referral to a local prenatal education class. Begin classes no later than the beginning of month 6 of your pregnancy.  Ask for help if you have counseling or nutritional needs during pregnancy. Your health care provider can offer advice or refer you to specialists for help  with various needs.  Do not use hot tubs, steam rooms, or saunas.  Do not douche or use tampons or scented sanitary pads.  Do not cross your legs for long periods of time.  Avoid cat litter boxes and soil used by cats. These carry germs that can cause birth defects in the baby and possibly loss of the fetus by miscarriage or stillbirth.  Avoid all smoking, herbs, alcohol, and medicines not prescribed by your health care provider. Chemicals in these products affect the formation and growth of the baby.  Do not use any products that contain nicotine or tobacco, such as cigarettes and e-cigarettes. If you need help quitting, ask your health care provider. You may receive counseling support and other resources to help you quit.  Schedule a dentist appointment. At home, brush your teeth with a soft toothbrush and be gentle when you floss. Contact a health care provider if:  You have dizziness.  You have mild pelvic cramps, pelvic pressure, or nagging pain in the abdominal area.  You have persistent nausea, vomiting, or diarrhea.  You have a bad smelling vaginal discharge.  You have pain when you urinate.  You notice increased swelling in your face, hands, legs, or ankles.  You are exposed to fifth disease or chickenpox.  You are exposed to German measles (rubella) and have never had it. Get help right away if:  You have a fever.  You are leaking fluid from your vagina.  You have spotting or bleeding from your vagina.  You have severe abdominal cramping or pain.  You have rapid weight gain or loss.  You vomit blood or material that looks like coffee grounds.  You develop a severe headache.  You have shortness of breath.  You have any kind of trauma, such as from a fall or a car accident. Summary  The first trimester of pregnancy is from week 1 until the end of week 13 (months 1 through 3).  Your body goes through many changes during pregnancy. The changes vary from  woman to woman.  You will have routine prenatal visits. During those visits, your health care provider will examine you, discuss any test results you may have, and talk with you about how you are feeling. This information is not intended to replace advice given to you by your health care provider. Make sure you discuss any questions you have with your health care provider. Document Released: 02/17/2001 Document Revised: 02/05/2016 Document Reviewed: 02/05/2016 [employer] Interactive Patient Education  2018   [employer]   Second Trimester of Pregnancy The second trimester is from week 14 through week 27 (months 4 through 6). The second trimester is often a time when you feel your best. Your body has adjusted to being pregnant, and you begin to feel better physically. Usually, morning sickness has lessened or quit completely, you may have more energy, and you may have an increase in appetite. The second trimester is also a time when the fetus is growing rapidly. At the end of the sixth month, the fetus is about 9 inches long and weighs about 1 pounds. You will likely begin to feel the baby move (quickening) between 16 and 20 weeks of pregnancy. Body changes during your second trimester Your body continues to go through many changes during your second trimester. The changes vary from woman to woman.  Your weight will continue to increase. You will notice your lower abdomen bulging out.  You may begin to get stretch marks on your hips, abdomen, and breasts.  You may develop headaches that can be relieved by medicines. The medicines should be approved by your health care provider.  You may urinate more often because the fetus is pressing on your bladder.  You may develop or continue to have heartburn as a result of your pregnancy.  You may develop constipation because certain hormones are causing the muscles that push waste through your intestines to slow down.  You may develop hemorrhoids or  swollen, bulging veins (varicose veins).  You may have back pain. This is caused by: ? Weight gain. ? Pregnancy hormones that are relaxing the joints in your pelvis. ? A shift in weight and the muscles that support your balance.  Your breasts will continue to grow and they will continue to become tender.  Your gums may bleed and may be sensitive to brushing and flossing.  Dark spots or blotches (chloasma, mask of pregnancy) may develop on your face. This will likely fade after the baby is born.  A dark line from your belly button to the pubic area (linea nigra) may appear. This will likely fade after the baby is born.  You may have changes in your hair. These can include thickening of your hair, rapid growth, and changes in texture. Some women also have hair loss during or after pregnancy, or hair that feels dry or thin. Your hair will most likely return to normal after your baby is born.  What to expect at prenatal visits During a routine prenatal visit:  You will be weighed to make sure you and the fetus are growing normally.  Your blood pressure will be taken.  Your abdomen will be measured to track your baby's growth.  The fetal heartbeat will be listened to.  Any test results from the previous visit will be discussed.  Your health care provider may ask you:  How you are feeling.  If you are feeling the baby move.  If you have had any abnormal symptoms, such as leaking fluid, bleeding, severe headaches, or abdominal cramping.  If you are using any tobacco products, including cigarettes, chewing tobacco, and electronic cigarettes.  If you have any questions.  Other tests that may be performed during your second trimester include:  Blood tests that check for: ? Low iron levels (anemia). ? High blood sugar that affects pregnant women (gestational diabetes) between 24 and 28 weeks. ? Rh antibodies. This is to check for a protein on red blood cells (Rh factor).  Urine  tests to   check for infections, diabetes, or protein in the urine.  An ultrasound to confirm the proper growth and development of the baby.  An amniocentesis to check for possible genetic problems.  Fetal screens for spina bifida and Down syndrome.  HIV (human immunodeficiency virus) testing. Routine prenatal testing includes screening for HIV, unless you choose not to have this test.  Follow these instructions at home: Medicines  Follow your health care provider's instructions regarding medicine use. Specific medicines may be either safe or unsafe to take during pregnancy.  Take a prenatal vitamin that contains at least 600 micrograms (mcg) of folic acid.  If you develop constipation, try taking a stool softener if your health care provider approves. Eating and drinking  Eat a balanced diet that includes fresh fruits and vegetables, whole grains, good sources of protein such as meat, eggs, or tofu, and low-fat dairy. Your health care provider will help you determine the amount of weight gain that is right for you.  Avoid raw meat and uncooked cheese. These carry germs that can cause birth defects in the baby.  If you have low calcium intake from food, talk to your health care provider about whether you should take a daily calcium supplement.  Limit foods that are high in fat and processed sugars, such as fried and sweet foods.  To prevent constipation: ? Drink enough fluid to keep your urine clear or pale yellow. ? Eat foods that are high in fiber, such as fresh fruits and vegetables, whole grains, and beans. Activity  Exercise only as directed by your health care provider. Most women can continue their usual exercise routine during pregnancy. Try to exercise for 30 minutes at least 5 days a week. Stop exercising if you experience uterine contractions.  Avoid heavy lifting, wear low heel shoes, and practice good posture.  A sexual relationship may be continued unless your health  care provider directs you otherwise. Relieving pain and discomfort  Wear a good support bra to prevent discomfort from breast tenderness.  Take warm sitz baths to soothe any pain or discomfort caused by hemorrhoids. Use hemorrhoid cream if your health care provider approves.  Rest with your legs elevated if you have leg cramps or low back pain.  If you develop varicose veins, wear support hose. Elevate your feet for 15 minutes, 3-4 times a day. Limit salt in your diet. Prenatal Care  Write down your questions. Take them to your prenatal visits.  Keep all your prenatal visits as told by your health care provider. This is important. Safety  Wear your seat belt at all times when driving.  Make a list of emergency phone numbers, including numbers for family, friends, the hospital, and police and fire departments. General instructions  Ask your health care provider for a referral to a local prenatal education class. Begin classes no later than the beginning of month 6 of your pregnancy.  Ask for help if you have counseling or nutritional needs during pregnancy. Your health care provider can offer advice or refer you to specialists for help with various needs.  Do not use hot tubs, steam rooms, or saunas.  Do not douche or use tampons or scented sanitary pads.  Do not cross your legs for long periods of time.  Avoid cat litter boxes and soil used by cats. These carry germs that can cause birth defects in the baby and possibly loss of the fetus by miscarriage or stillbirth.  Avoid all smoking, herbs, alcohol, and unprescribed drugs. Chemicals   in these products can affect the formation and growth of the baby.  Do not use any products that contain nicotine or tobacco, such as cigarettes and e-cigarettes. If you need help quitting, ask your health care provider.  Visit your dentist if you have not gone yet during your pregnancy. Use a soft toothbrush to brush your teeth and be gentle when  you floss. Contact a health care provider if:  You have dizziness.  You have mild pelvic cramps, pelvic pressure, or nagging pain in the abdominal area.  You have persistent nausea, vomiting, or diarrhea.  You have a bad smelling vaginal discharge.  You have pain when you urinate. Get help right away if:  You have a fever.  You are leaking fluid from your vagina.  You have spotting or bleeding from your vagina.  You have severe abdominal cramping or pain.  You have rapid weight gain or weight loss.  You have shortness of breath with chest pain.  You notice sudden or extreme swelling of your face, hands, ankles, feet, or legs.  You have not felt your baby move in over an hour.  You have severe headaches that do not go away when you take medicine.  You have vision changes. Summary  The second trimester is from week 14 through week 27 (months 4 through 6). It is also a time when the fetus is growing rapidly.  Your body goes through many changes during pregnancy. The changes vary from woman to woman.  Avoid all smoking, herbs, alcohol, and unprescribed drugs. These chemicals affect the formation and growth your baby.  Do not use any tobacco products, such as cigarettes, chewing tobacco, and e-cigarettes. If you need help quitting, ask your health care provider.  Contact your health care provider if you have any questions. Keep all prenatal visits as told by your health care provider. This is important. This information is not intended to replace advice given to you by your health care provider. Make sure you discuss any questions you have with your health care provider. Document Released: 02/17/2001 Document Revised: 03/31/2016 Document Reviewed: 03/31/2016 [employer] Interactive Patient Education  2018 [employer]   Contraception Choices Contraception, also called birth control, refers to methods or devices that prevent pregnancy. Hormonal methods Contraceptive  implant A contraceptive implant is a thin, plastic tube that contains a hormone. It is inserted into the upper part of the arm. It can remain in place for up to 3 years. Progestin-only injections Progestin-only injections are injections of progestin, a synthetic form of the hormone progesterone. They are given every 3 months by a health care provider. Birth control pills Birth control pills are pills that contain hormones that prevent pregnancy. They must be taken once a day, preferably at the same time each day. Birth control patch The birth control patch contains hormones that prevent pregnancy. It is placed on the skin and must be changed once a week for three weeks and removed on the fourth week. A prescription is needed to use this method of contraception. Vaginal ring A vaginal ring contains hormones that prevent pregnancy. It is placed in the vagina for three weeks and removed on the fourth week. After that, the process is repeated with a new ring. A prescription is needed to use this method of contraception. Emergency contraceptive Emergency contraceptives prevent pregnancy after unprotected sex. They come in pill form and can be taken up to 5 days after sex. They work best the sooner they are taken after having   sex. Most emergency contraceptives are available without a prescription. This method should not be used as your only form of birth control. Barrier methods Female condom A female condom is a thin sheath that is worn over the penis during sex. Condoms keep sperm from going inside a woman's body. They can be used with a spermicide to increase their effectiveness. They should be disposed after a single use. Female condom A female condom is a soft, loose-fitting sheath that is put into the vagina before sex. The condom keeps sperm from going inside a woman's body. They should be disposed after a single use. Diaphragm A diaphragm is a soft, dome-shaped barrier. It is inserted into the vagina  before sex, along with a spermicide. The diaphragm blocks sperm from entering the uterus, and the spermicide kills sperm. A diaphragm should be left in the vagina for 6-8 hours after sex and removed within 24 hours. A diaphragm is prescribed and fitted by a health care provider. A diaphragm should be replaced every 1-2 years, after giving birth, after gaining more than 15 lb (6.8 kg), and after pelvic surgery. Cervical cap A cervical cap is a round, soft latex or plastic cup that fits over the cervix. It is inserted into the vagina before sex, along with spermicide. It blocks sperm from entering the uterus. The cap should be left in place for 6-8 hours after sex and removed within 48 hours. A cervical cap must be prescribed and fitted by a health care provider. It should be replaced every 2 years. Sponge A sponge is a soft, circular piece of polyurethane foam with spermicide on it. The sponge helps block sperm from entering the uterus, and the spermicide kills sperm. To use it, you make it wet and then insert it into the vagina. It should be inserted before sex, left in for at least 6 hours after sex, and removed and thrown away within 30 hours. Spermicides Spermicides are chemicals that kill or block sperm from entering the cervix and uterus. They can come as a cream, jelly, suppository, foam, or tablet. A spermicide should be inserted into the vagina with an applicator at least 10-15 minutes before sex to allow time for it to work. The process must be repeated every time you have sex. Spermicides do not require a prescription. Intrauterine contraception Intrauterine device (IUD) An IUD is a T-shaped device that is put in a woman's uterus. There are two types:  Hormone IUD.This type contains progestin, a synthetic form of the hormone progesterone. This type can stay in place for 3-5 years.  Copper IUD.This type is wrapped in copper wire. It can stay in place for 10 years.  Permanent methods of  contraception Female tubal ligation In this method, a woman's fallopian tubes are sealed, tied, or blocked during surgery to prevent eggs from traveling to the uterus. Hysteroscopic sterilization In this method, a small, flexible insert is placed into each fallopian tube. The inserts cause scar tissue to form in the fallopian tubes and block them, so sperm cannot reach an egg. The procedure takes about 3 months to be effective. Another form of birth control must be used during those 3 months. Female sterilization This is a procedure to tie off the tubes that carry sperm (vasectomy). After the procedure, the man can still ejaculate fluid (semen). Natural planning methods Natural family planning In this method, a couple does not have sex on days when the woman could become pregnant. Calendar method This means keeping track of the   length of each menstrual cycle, identifying the days when pregnancy can happen, and not having sex on those days. Ovulation method In this method, a couple avoids sex during ovulation. Symptothermal method This method involves not having sex during ovulation. The woman typically checks for ovulation by watching changes in her temperature and in the consistency of cervical mucus. Post-ovulation method In this method, a couple waits to have sex until after ovulation. Summary  Contraception, also called birth control, means methods or devices that prevent pregnancy.  Hormonal methods of contraception include implants, injections, pills, patches, vaginal rings, and emergency contraceptives.  Barrier methods of contraception can include female condoms, female condoms, diaphragms, cervical caps, sponges, and spermicides.  There are two types of IUDs (intrauterine devices). An IUD can be put in a woman's uterus to prevent pregnancy for 3-5 years.  Permanent sterilization can be done through a procedure for males, females, or both.  Natural family planning methods involve  not having sex on days when the woman could become pregnant. This information is not intended to replace advice given to you by your health care provider. Make sure you discuss any questions you have with your health care provider. Document Released: 02/23/2005 Document Revised: 03/28/2016 Document Reviewed: 03/28/2016 Elsevier Interactive Patient Education  2018 [employer]   Breastfeeding Choosing to breastfeed is one of the best decisions you can make for yourself and your baby. A change in hormones during pregnancy causes your breasts to make breast milk in your milk-producing glands. Hormones prevent breast milk from being released before your baby is born. They also prompt milk flow after birth. Once breastfeeding has begun, thoughts of your baby, as well as his or her sucking or crying, can stimulate the release of milk from your milk-producing glands. Benefits of breastfeeding Research shows that breastfeeding offers many health benefits for infants and mothers. It also offers a cost-free and convenient way to feed your baby. For your baby  Your first milk (colostrum) helps your baby's digestive system to function better.  Special cells in your milk (antibodies) help your baby to fight off infections.  Breastfed babies are less likely to develop asthma, allergies, obesity, or type 2 diabetes. They are also at lower risk for sudden infant death syndrome (SIDS).  Nutrients in breast milk are better able to meet your baby's needs compared to infant formula.  Breast milk improves your baby's brain development. For you  Breastfeeding helps to create a very special bond between you and your baby.  Breastfeeding is convenient. Breast milk costs nothing and is always available at the correct temperature.  Breastfeeding helps to burn calories. It helps you to lose the weight that you gained during pregnancy.  Breastfeeding makes your uterus return faster to its size before pregnancy.  It also slows bleeding (lochia) after you give birth.  Breastfeeding helps to lower your risk of developing type 2 diabetes, osteoporosis, rheumatoid arthritis, cardiovascular disease, and breast, ovarian, uterine, and endometrial cancer later in life. Breastfeeding basics Starting breastfeeding  Find a comfortable place to sit or lie down, with your neck and back well-supported.  Place a pillow or a rolled-up blanket under your baby to bring him or her to the level of your breast (if you are seated). Nursing pillows are specially designed to help support your arms and your baby while you breastfeed.  Make sure that your baby's tummy (abdomen) is facing your abdomen.  Gently massage your breast. With your fingertips, massage from the outer edges of   your breast inward toward the nipple. This encourages milk flow. If your milk flows slowly, you may need to continue this action during the feeding.  Support your breast with 4 fingers underneath and your thumb above your nipple (make the letter "C" with your hand). Make sure your fingers are well away from your nipple and your baby's mouth.  Stroke your baby's lips gently with your finger or nipple.  When your baby's mouth is open wide enough, quickly bring your baby to your breast, placing your entire nipple and as much of the areola as possible into your baby's mouth. The areola is the colored area around your nipple. ? More areola should be visible above your baby's upper lip than below the lower lip. ? Your baby's lips should be opened and extended outward (flanged) to ensure an adequate, comfortable latch. ? Your baby's tongue should be between his or her lower gum and your breast.  Make sure that your baby's mouth is correctly positioned around your nipple (latched). Your baby's lips should create a seal on your breast and be turned out (everted).  It is common for your baby to suck about 2-3 minutes in order to start the flow of breast  milk. Latching Teaching your baby how to latch onto your breast properly is very important. An improper latch can cause nipple pain, decreased milk supply, and poor weight gain in your baby. Also, if your baby is not latched onto your nipple properly, he or she may swallow some air during feeding. This can make your baby fussy. Burping your baby when you switch breasts during the feeding can help to get rid of the air. However, teaching your baby to latch on properly is still the best way to prevent fussiness from swallowing air while breastfeeding. Signs that your baby has successfully latched onto your nipple  Silent tugging or silent sucking, without causing you pain. Infant's lips should be extended outward (flanged).  Swallowing heard between every 3-4 sucks once your milk has started to flow (after your let-down milk reflex occurs).  Muscle movement above and in front of his or her ears while sucking.  Signs that your baby has not successfully latched onto your nipple  Sucking sounds or smacking sounds from your baby while breastfeeding.  Nipple pain.  If you think your baby has not latched on correctly, slip your finger into the corner of your baby's mouth to break the suction and place it between your baby's gums. Attempt to start breastfeeding again. Signs of successful breastfeeding Signs from your baby  Your baby will gradually decrease the number of sucks or will completely stop sucking.  Your baby will fall asleep.  Your baby's body will relax.  Your baby will retain a small amount of milk in his or her mouth.  Your baby will let go of your breast by himself or herself.  Signs from you  Breasts that have increased in firmness, weight, and size 1-3 hours after feeding.  Breasts that are softer immediately after breastfeeding.  Increased milk volume, as well as a change in milk consistency and color by the fifth day of breastfeeding.  Nipples that are not sore,  cracked, or bleeding.  Signs that your baby is getting enough milk  Wetting at least 1-2 diapers during the first 24 hours after birth.  Wetting at least 5-6 diapers every 24 hours for the first week after birth. The urine should be clear or pale yellow by the age of 5   days.  Wetting 6-8 diapers every 24 hours as your baby continues to grow and develop.  At least 3 stools in a 24-hour period by the age of 5 days. The stool should be soft and yellow.  At least 3 stools in a 24-hour period by the age of 7 days. The stool should be seedy and yellow.  No loss of weight greater than 10% of birth weight during the first 3 days of life.  Average weight gain of 4-7 oz (113-198 g) per week after the age of 4 days.  Consistent daily weight gain by the age of 5 days, without weight loss after the age of 2 weeks. After a feeding, your baby may spit up a small amount of milk. This is normal. Breastfeeding frequency and duration Frequent feeding will help you make more milk and can prevent sore nipples and extremely full breasts (breast engorgement). Breastfeed when you feel the need to reduce the fullness of your breasts or when your baby shows signs of hunger. This is called "breastfeeding on demand." Signs that your baby is hungry include:  Increased alertness, activity, or restlessness.  Movement of the head from side to side.  Opening of the mouth when the corner of the mouth or cheek is stroked (rooting).  Increased sucking sounds, smacking lips, cooing, sighing, or squeaking.  Hand-to-mouth movements and sucking on fingers or hands.  Fussing or crying.  Avoid introducing a pacifier to your baby in the first 4-6 weeks after your baby is born. After this time, you may choose to use a pacifier. Research has shown that pacifier use during the first year of a baby's life decreases the risk of sudden infant death syndrome (SIDS). Allow your baby to feed on each breast as long as he or she  wants. When your baby unlatches or falls asleep while feeding from the first breast, offer the second breast. Because newborns are often sleepy in the first few weeks of life, you may need to awaken your baby to get him or her to feed. Breastfeeding times will vary from baby to baby. However, the following rules can serve as a guide to help you make sure that your baby is properly fed:  Newborns (babies 4 weeks of age or younger) may breastfeed every 1-3 hours.  Newborns should not go without breastfeeding for longer than 3 hours during the day or 5 hours during the night.  You should breastfeed your baby a minimum of 8 times in a 24-hour period.  Breast milk pumping Pumping and storing breast milk allows you to make sure that your baby is exclusively fed your breast milk, even at times when you are unable to breastfeed. This is especially important if you go back to work while you are still breastfeeding, or if you are not able to be present during feedings. Your lactation consultant can help you find a method of pumping that works best for you and give you guidelines about how long it is safe to store breast milk. Caring for your breasts while you breastfeed Nipples can become dry, cracked, and sore while breastfeeding. The following recommendations can help keep your breasts moisturized and healthy:  Avoid using soap on your nipples.  Wear a supportive bra designed especially for nursing. Avoid wearing underwire-style bras or extremely tight bras (sports bras).  Air-dry your nipples for 3-4 minutes after each feeding.  Use only cotton bra pads to absorb leaked breast milk. Leaking of breast milk between feedings is normal.    Use lanolin on your nipples after breastfeeding. Lanolin helps to maintain your skin's normal moisture barrier. Pure lanolin is not harmful (not toxic) to your baby. You may also hand express a few drops of breast milk and gently massage that milk into your nipples and  allow the milk to air-dry.  In the first few weeks after giving birth, some women experience breast engorgement. Engorgement can make your breasts feel heavy, warm, and tender to the touch. Engorgement peaks within 3-5 days after you give birth. The following recommendations can help to ease engorgement:  Completely empty your breasts while breastfeeding or pumping. You may want to start by applying warm, moist heat (in the shower or with warm, water-soaked hand towels) just before feeding or pumping. This increases circulation and helps the milk flow. If your baby does not completely empty your breasts while breastfeeding, pump any extra milk after he or she is finished.  Apply ice packs to your breasts immediately after breastfeeding or pumping, unless this is too uncomfortable for you. To do this: ? Put ice in a plastic bag. ? Place a towel between your skin and the bag. ? Leave the ice on for 20 minutes, 2-3 times a day.  Make sure that your baby is latched on and positioned properly while breastfeeding.  If engorgement persists after 48 hours of following these recommendations, contact your health care provider or a lactation consultant. Overall health care recommendations while breastfeeding  Eat 3 healthy meals and 3 snacks every day. Well-nourished mothers who are breastfeeding need an additional 450-500 calories a day. You can meet this requirement by increasing the amount of a balanced diet that you eat.  Drink enough water to keep your urine pale yellow or clear.  Rest often, relax, and continue to take your prenatal vitamins to prevent fatigue, stress, and low vitamin and mineral levels in your body (nutrient deficiencies).  Do not use any products that contain nicotine or tobacco, such as cigarettes and e-cigarettes. Your baby may be harmed by chemicals from cigarettes that pass into breast milk and exposure to secondhand smoke. If you need help quitting, ask your health care  provider.  Avoid alcohol.  Do not use illegal drugs or marijuana.  Talk with your health care provider before taking any medicines. These include over-the-counter and prescription medicines as well as vitamins and herbal supplements. Some medicines that may be harmful to your baby can pass through breast milk.  It is possible to become pregnant while breastfeeding. If birth control is desired, ask your health care provider about options that will be safe while breastfeeding your baby. Where to find more information: [employer]: www.llli.org Contact a health care provider if:  You feel like you want to stop breastfeeding or have become frustrated with breastfeeding.  Your nipples are cracked or bleeding.  Your breasts are red, tender, or warm.  You have: ? Painful breasts or nipples. ? A swollen area on either breast. ? A fever or chills. ? Nausea or vomiting. ? Drainage other than breast milk from your nipples.  Your breasts do not become full before feedings by the fifth day after you give birth.  You feel sad and depressed.  Your baby is: ? Too sleepy to eat well. ? Having trouble sleeping. ? More than 1 week old and wetting fewer than 6 diapers in a 24-hour period. ? Not gaining weight by 5 days of age.  Your baby has fewer than 3 stools in a   24-hour period.  Your baby's skin or the white parts of his or her eyes become yellow. Get help right away if:  Your baby is overly tired (lethargic) and does not want to wake up and feed.  Your baby develops an unexplained fever. Summary  Breastfeeding offers many health benefits for infant and mothers.  Try to breastfeed your infant when he or she shows early signs of hunger.  Gently tickle or stroke your baby's lips with your finger or nipple to allow the baby to open his or her mouth. Bring the baby to your breast. Make sure that much of the areola is in your baby's mouth. Offer one side and burp the  baby before you offer the other side.  Talk with your health care provider or lactation consultant if you have questions or you face problems as you breastfeed. This information is not intended to replace advice given to you by your health care provider. Make sure you discuss any questions you have with your health care provider. Document Released: 02/23/2005 Document Revised: 03/27/2016 Document Reviewed: 03/27/2016 Elsevier Interactive Patient Education  2018 [employer]  

## 2017-12-29 NOTE — Progress Notes (Signed)
Pt here with C/O's bleeding with clots last night and cramping.  She was scheduled on Friday for her NOB intake but since the bleeding she wanted to come in sooner to check out the bleeding.Marland Kitchen

## 2017-12-29 NOTE — Progress Notes (Signed)
Bedside U/S shows single IUP with FHT of 166 BPM and CRL measures 37.40mm  GA is [redacted]w[redacted]d.

## 2017-12-29 NOTE — Addendum Note (Signed)
Addended by: Granville Lewis on: 12/29/2017 03:31 PM   Modules accepted: Orders

## 2017-12-30 LAB — OBSTETRIC PANEL
ANTIBODY SCREEN: NOT DETECTED
BASOS ABS: 49 {cells}/uL (ref 0–200)
Basophils Relative: 0.6 %
EOS ABS: 254 {cells}/uL (ref 15–500)
EOS PCT: 3.1 %
HCT: 36.1 % (ref 35.0–45.0)
Hemoglobin: 12.9 g/dL (ref 11.7–15.5)
Hepatitis B Surface Ag: NONREACTIVE
LYMPHS ABS: 2599 {cells}/uL (ref 850–3900)
MCH: 33.5 pg — ABNORMAL HIGH (ref 27.0–33.0)
MCHC: 35.7 g/dL (ref 32.0–36.0)
MCV: 93.8 fL (ref 80.0–100.0)
MPV: 10.6 fL (ref 7.5–12.5)
Monocytes Relative: 5.1 %
NEUTROS ABS: 4879 {cells}/uL (ref 1500–7800)
Neutrophils Relative %: 59.5 %
PLATELETS: 280 10*3/uL (ref 140–400)
RBC: 3.85 10*6/uL (ref 3.80–5.10)
RDW: 11.9 % (ref 11.0–15.0)
RPR: NONREACTIVE
Rubella: 1.08 index
Total Lymphocyte: 31.7 %
WBC: 8.2 10*3/uL (ref 3.8–10.8)
WBCMIX: 418 {cells}/uL (ref 200–950)

## 2017-12-30 LAB — COMPREHENSIVE METABOLIC PANEL
AG RATIO: 1.6 (calc) (ref 1.0–2.5)
ALKALINE PHOSPHATASE (APISO): 65 U/L (ref 33–115)
ALT: 17 U/L (ref 6–29)
AST: 18 U/L (ref 10–30)
Albumin: 3.9 g/dL (ref 3.6–5.1)
BILIRUBIN TOTAL: 0.8 mg/dL (ref 0.2–1.2)
BUN/Creatinine Ratio: 23 (calc) — ABNORMAL HIGH (ref 6–22)
BUN: 11 mg/dL (ref 7–25)
CALCIUM: 9.1 mg/dL (ref 8.6–10.2)
CO2: 26 mmol/L (ref 20–32)
Chloride: 105 mmol/L (ref 98–110)
Creat: 0.48 mg/dL — ABNORMAL LOW (ref 0.50–1.10)
Globulin: 2.5 g/dL (calc) (ref 1.9–3.7)
Glucose, Bld: 76 mg/dL (ref 65–99)
Potassium: 4.8 mmol/L (ref 3.5–5.3)
Sodium: 137 mmol/L (ref 135–146)
TOTAL PROTEIN: 6.4 g/dL (ref 6.1–8.1)

## 2017-12-30 LAB — HIV ANTIBODY (ROUTINE TESTING W REFLEX): HIV: NONREACTIVE

## 2017-12-30 LAB — HEMOGLOBIN A1C
EAG (MMOL/L): 5.2 (calc)
HEMOGLOBIN A1C: 4.9 %{Hb} (ref <5.7)
Mean Plasma Glucose: 94 (calc)

## 2017-12-30 LAB — PROTEIN / CREATININE RATIO, URINE
Creatinine, Urine: 162 mg/dL (ref 20–275)
Protein/Creat Ratio: 86 mg/g creat (ref 21–161)
Protein/Creatinine Ratio: 0.086 mg/mg creat (ref 0.021–0.16)
Total Protein, Urine: 14 mg/dL (ref 5–24)

## 2017-12-30 NOTE — Progress Notes (Signed)
Bedside ultrasound performed demonstrating a viable 10 week IUP. Patient to have new Ob visit today. See separate note

## 2017-12-31 ENCOUNTER — Encounter: Payer: Self-pay | Admitting: Certified Nurse Midwife

## 2017-12-31 LAB — URINE CULTURE, OB REFLEX

## 2017-12-31 LAB — CULTURE, OB URINE

## 2018-01-03 LAB — CYTOLOGY - PAP
Chlamydia: NEGATIVE
Diagnosis: NEGATIVE
Neisseria Gonorrhea: NEGATIVE

## 2018-01-21 ENCOUNTER — Other Ambulatory Visit: Payer: Self-pay | Admitting: Obstetrics and Gynecology

## 2018-01-21 MED ORDER — BUTALBITAL-APAP-CAFFEINE 50-325-40 MG PO CAPS: 1.0000 | ORAL_CAPSULE | Freq: Four times a day (QID) | ORAL | 3 refills | Status: DC | PRN

## 2018-01-25 ENCOUNTER — Ambulatory Visit (INDEPENDENT_AMBULATORY_CARE_PROVIDER_SITE_OTHER): Payer: PRIVATE HEALTH INSURANCE | Admitting: Advanced Practice Midwife

## 2018-01-25 VITALS — BP 106/48 | HR 69 | Wt 192.0 lb

## 2018-01-25 DIAGNOSIS — Z23 Encounter for immunization: Secondary | ICD-10-CM | POA: Diagnosis not present

## 2018-01-25 DIAGNOSIS — Z3A19 19 weeks gestation of pregnancy: Secondary | ICD-10-CM

## 2018-01-25 DIAGNOSIS — Z3482 Encounter for supervision of other normal pregnancy, second trimester: Secondary | ICD-10-CM

## 2018-01-25 DIAGNOSIS — Z3480 Encounter for supervision of other normal pregnancy, unspecified trimester: Secondary | ICD-10-CM

## 2018-01-25 DIAGNOSIS — O34219 Maternal care for unspecified type scar from previous cesarean delivery: Secondary | ICD-10-CM

## 2018-01-25 NOTE — Progress Notes (Signed)
   PRENATAL VISIT NOTE  Subjective:  Gabrielle Roy is a 27 y.o. G3P1102 at 6472w4d being seen today for ongoing prenatal care.  She is currently monitored for the following issues for this low-risk pregnancy and has Supervision of normal pregnancy, antepartum; Anxiety in pregnancy, antepartum; Depressive disorder, not elsewhere classified; History of gestational hypertension; Gastric bypass status for obesity; and Previous cesarean section complicating pregnancy, antepartum condition or complication on their problem list.  Patient reports no complaints.  Contractions: Not present. Vag. Bleeding: None.  Movement: Absent. Denies leaking of fluid.   The following portions of the patient's history were reviewed and updated as appropriate: allergies, current medications, past family history, past medical history, past social history, past surgical history and problem list. Problem list updated.  Objective:   Vitals:   01/25/18 0855  BP: (!) 106/48  Pulse: 69  Weight: 87.1 kg    Fetal Status: Fetal Heart Rate (bpm): 147   Movement: Absent     General:  Alert, oriented and cooperative. Patient is in no acute distress.  Skin: Skin is warm and dry. No rash noted.   Cardiovascular: Normal heart rate noted  Respiratory: Normal respiratory effort, no problems with respiration noted  Abdomen: Soft, gravid, appropriate for gestational age.  Pain/Pressure: Present     Pelvic: Cervical exam deferred        Extremities: Normal range of motion.  Edema: None  Mental Status: Normal mood and affect. Normal behavior. Normal judgment and thought content.   Assessment and Plan:  Pregnancy: G3P1102 at 4672w4d  1. Supervision of other normal pregnancy, antepartum --Anticipatory guidance about next visits/weeks of pregnancy given. --Pt does not feel pregnant. Reassurance provided, FHT wnl today.  Discussed likely flutters/movement in next few weeks.   2. Needs flu shot - Flu Vaccine QUAD 36+ mos IM  3.  Previous cesarean section complicating pregnancy, antepartum condition or complication --G1, C/S at 35 weeks for FHR decelerations, oligohydramnios, amniotic sac abnormality.  No preterm labor. --G2 with repeat C/S --Plans repeat C/S this pregnancy  Preterm labor symptoms and general obstetric precautions including but not limited to vaginal bleeding, contractions, leaking of fluid and fetal movement were reviewed in detail with the patient. Please refer to After Visit Summary for other counseling recommendations.  Return in about 4 weeks (around 02/22/2018).  No future appointments.  Sharen CounterLisa Leftwich-Kirby, CNM

## 2018-01-25 NOTE — Patient Instructions (Signed)

## 2018-01-29 ENCOUNTER — Encounter (HOSPITAL_COMMUNITY): Payer: Self-pay | Admitting: *Deleted

## 2018-01-29 ENCOUNTER — Encounter: Payer: Self-pay | Admitting: Advanced Practice Midwife

## 2018-01-29 ENCOUNTER — Inpatient Hospital Stay (HOSPITAL_COMMUNITY)
Admission: AD | Admit: 2018-01-29 | Discharge: 2018-01-29 | Disposition: A | Discharge status: Home / Self Care | Source: Ambulatory Visit | Attending: Obstetrics and Gynecology | Admitting: Obstetrics and Gynecology

## 2018-01-29 DIAGNOSIS — O26812 Pregnancy related exhaustion and fatigue, second trimester: Secondary | ICD-10-CM | POA: Diagnosis not present

## 2018-01-29 DIAGNOSIS — Z87442 Personal history of urinary calculi: Secondary | ICD-10-CM | POA: Diagnosis not present

## 2018-01-29 DIAGNOSIS — Z9884 Bariatric surgery status: Secondary | ICD-10-CM

## 2018-01-29 DIAGNOSIS — O219 Vomiting of pregnancy, unspecified: Secondary | ICD-10-CM | POA: Insufficient documentation

## 2018-01-29 DIAGNOSIS — O99842 Bariatric surgery status complicating pregnancy, second trimester: Secondary | ICD-10-CM | POA: Insufficient documentation

## 2018-01-29 DIAGNOSIS — E860 Dehydration: Secondary | ICD-10-CM | POA: Diagnosis not present

## 2018-01-29 DIAGNOSIS — O26892 Other specified pregnancy related conditions, second trimester: Secondary | ICD-10-CM | POA: Insufficient documentation

## 2018-01-29 DIAGNOSIS — O09292 Supervision of pregnancy with other poor reproductive or obstetric history, second trimester: Secondary | ICD-10-CM

## 2018-01-29 DIAGNOSIS — R519 Headache, unspecified: Secondary | ICD-10-CM

## 2018-01-29 DIAGNOSIS — O99282 Endocrine, nutritional and metabolic diseases complicating pregnancy, second trimester: Secondary | ICD-10-CM | POA: Diagnosis not present

## 2018-01-29 DIAGNOSIS — I959 Hypotension, unspecified: Secondary | ICD-10-CM | POA: Insufficient documentation

## 2018-01-29 DIAGNOSIS — Z881 Allergy status to other antibiotic agents status: Secondary | ICD-10-CM | POA: Diagnosis not present

## 2018-01-29 DIAGNOSIS — Z3A15 15 weeks gestation of pregnancy: Secondary | ICD-10-CM

## 2018-01-29 DIAGNOSIS — Z885 Allergy status to narcotic agent status: Secondary | ICD-10-CM | POA: Insufficient documentation

## 2018-01-29 DIAGNOSIS — R319 Hematuria, unspecified: Secondary | ICD-10-CM | POA: Diagnosis not present

## 2018-01-29 DIAGNOSIS — R42 Dizziness and giddiness: Secondary | ICD-10-CM | POA: Diagnosis present

## 2018-01-29 DIAGNOSIS — Z87891 Personal history of nicotine dependence: Secondary | ICD-10-CM | POA: Diagnosis not present

## 2018-01-29 DIAGNOSIS — R51 Headache: Secondary | ICD-10-CM

## 2018-01-29 DIAGNOSIS — R5383 Other fatigue: Secondary | ICD-10-CM | POA: Diagnosis present

## 2018-01-29 DIAGNOSIS — Z79899 Other long term (current) drug therapy: Secondary | ICD-10-CM | POA: Insufficient documentation

## 2018-01-29 LAB — CBC
HEMATOCRIT: 32.3 % — AB (ref 36.0–46.0)
HEMOGLOBIN: 11.1 g/dL — AB (ref 12.0–15.0)
MCH: 33.6 pg (ref 26.0–34.0)
MCHC: 34.4 g/dL (ref 30.0–36.0)
MCV: 97.9 fL (ref 80.0–100.0)
NRBC: 0 % (ref 0.0–0.2)
Platelets: 218 10*3/uL (ref 150–400)
RBC: 3.3 MIL/uL — AB (ref 3.87–5.11)
RDW: 12.7 % (ref 11.5–15.5)
WBC: 7.7 10*3/uL (ref 4.0–10.5)

## 2018-01-29 LAB — TSH: TSH: 1.553 u[IU]/mL (ref 0.350–4.500)

## 2018-01-29 LAB — COMPREHENSIVE METABOLIC PANEL
ALT: 18 U/L (ref 0–44)
ANION GAP: 6 (ref 5–15)
AST: 16 U/L (ref 15–41)
Albumin: 3.2 g/dL — ABNORMAL LOW (ref 3.5–5.0)
Alkaline Phosphatase: 51 U/L (ref 38–126)
BILIRUBIN TOTAL: 0.7 mg/dL (ref 0.3–1.2)
BUN: 12 mg/dL (ref 6–20)
CHLORIDE: 109 mmol/L (ref 98–111)
CO2: 23 mmol/L (ref 22–32)
Calcium: 8.3 mg/dL — ABNORMAL LOW (ref 8.9–10.3)
Creatinine, Ser: 0.39 mg/dL — ABNORMAL LOW (ref 0.44–1.00)
Glucose, Bld: 86 mg/dL (ref 70–99)
POTASSIUM: 3.5 mmol/L (ref 3.5–5.1)
Sodium: 138 mmol/L (ref 135–145)
TOTAL PROTEIN: 5.9 g/dL — AB (ref 6.5–8.1)

## 2018-01-29 LAB — URINALYSIS, ROUTINE W REFLEX MICROSCOPIC
BACTERIA UA: NONE SEEN
Bilirubin Urine: NEGATIVE
GLUCOSE, UA: 50 mg/dL — AB
Ketones, ur: NEGATIVE mg/dL
Leukocytes, UA: NEGATIVE
Nitrite: NEGATIVE
PH: 6 (ref 5.0–8.0)
PROTEIN: NEGATIVE mg/dL
RBC / HPF: >50 RBC/hpf — ABNORMAL HIGH (ref 0–5)
Specific Gravity, Urine: 1.026 (ref 1.005–1.030)

## 2018-01-29 NOTE — MAU Provider Note (Signed)
History    CSN: [id number] Arrival date and time: 01/29/18 1836 First Provider Initiated Contact with Patient 01/29/18 1939    Chief Complaint  Patient presents with  . Fatigue  . Dizziness  . Headache  . Hypotension   HPI 27yo [id number] at 2347w1d with history of gastric bypass who presents from home with lightheadedness, fatigue, headache and low BP. States today she started to notice some palpitations and lightheadedness, felt like she may pass out. Called on-call nurse who encouraged her to come into be evaluated. Feels like it has been a relatively normal week, no increased stress, eating and drinking like normal. Denies any nausea/vomiting, diarrhea/constipation. Also noticed some spots in vision. History of preeclampsia and was worried about BP with symptoms - checked at home and was 90/30s which concerned her.   Gastric bypass was earlier this year. Lost 108 lbs.   OB History    Gravida  3   Para  2   Term  1   Preterm  1   AB      Living  2     SAB      TAB      Ectopic      Multiple  0   Live Births  2           Past Medical History:  Diagnosis Date  . Kidney stone    Stent placed in Feb 2017  . PONV (postoperative nausea and vomiting)    nausea during last c section  . Supervision of normal pregnancy, antepartum 10/04/2015    Clinic  [facility] - transfer from Dr. Shawnie Ponsorn Prenatal Labs Dating  Blood type:    Genetic Screen 1 Screen:    AFP:     Quad:     NIPS: Antibody:  Anatomic US  Nml fetal anatomy @ 18 wks; low lying placenta > resolved @23  wks Rubella:   GTT Early:               Third trimester:  RPR:    Flu vaccine  Declined HBsAg:    TDaP vaccine                                               Rhogam: HIV:    Baby Food  Breastfeed                                            GBS: (For PCN allergy, check sensitivities) Contraception  Pap: Circumcision  n/a female ([facility] ultrasound)  Pediatrician   Support Person  [name] (husband)      Past Surgical  History:  Procedure Laterality Date  . CESAREAN SECTION    . CESAREAN SECTION N/A 02/20/2016   Procedure: REPEAT CESAREAN SECTION;  Surgeon: Hermina StaggersMichael L Ervin, MD;  [facility];  Service: Obstetrics;  Laterality: N/A;  . FINGER SURGERY    . GASTRIC BYPASS    . kidney stint      Family History  Problem Relation Age of Onset  . Hypertension Father   . Heart disease Father   . Hearing loss Father     Social History   Tobacco Use  . Smoking status: Former Games developermoker  . Smokeless tobacco: Never Used  Substance Use  Topics  . Alcohol use: No  . Drug use: No    Allergies:  Allergies  Allergen Reactions  . Alcohol Hives    Allergic to alcohol containing products including cough medicines that contain alcohol  . Flagyl [Metronidazole] Other (See Comments)    Facial flushing  . Hydrocodone-Acetaminophen Nausea And Vomiting    Medications Prior to Admission  Medication Sig Dispense Refill Last Dose  . Butalbital-APAP-Caffeine 50-325-40 MG capsule Take 1-2 capsules by mouth every 6 (six) hours as needed for headache. 30 capsule 3 01/29/2018 at Unknown time  . Prenatal Vit-Fe Fumarate-FA (PRENATAL VITAMIN PO) Take by mouth.   Past Week at Unknown time  . sertraline (ZOLOFT) 100 MG tablet Take by mouth.   More than a month at Unknown time    Review of Systems  Constitutional: Positive for activity change and fatigue. Negative for appetite change and chills.  HENT: Negative for congestion.   Respiratory: Negative for cough and shortness of breath.   Cardiovascular: Positive for palpitations. Negative for chest pain.  Gastrointestinal: Negative for abdominal pain, constipation, diarrhea, nausea and vomiting.  Genitourinary: Negative for dysuria, flank pain, vaginal bleeding and vaginal discharge.  Musculoskeletal: Negative for arthralgias and myalgias.  Skin: Negative for rash.  Neurological: Positive for dizziness and light-headedness. Negative for weakness and  headaches.  Psychiatric/Behavioral: Negative for sleep disturbance. The patient is not nervous/anxious.    Physical Exam   Blood pressure 121/65, pulse 80, temperature 98 F (36.7 C), temperature source Oral, resp. rate 16, weight 87.1 kg, last menstrual period 10/21/2017, SpO2 100 %, not currently breastfeeding.  Physical Exam  Nursing note and vitals reviewed. Constitutional: She is oriented to person, place, and time. She appears well-developed and well-nourished. No distress.  HENT:  Head: Normocephalic and atraumatic.  Mouth/Throat: Oropharynx is clear and moist.  Eyes: Conjunctivae and EOM are normal. No scleral icterus.  Neck: Neck supple. No thyromegaly present.  Cardiovascular: Normal rate, regular rhythm, normal heart sounds and intact distal pulses.  No murmur heard. Respiratory: Effort normal and breath sounds normal. She has no wheezes.  GI: Soft. Bowel sounds are normal. There is no tenderness. There is no guarding.  Musculoskeletal: She exhibits no edema.  Lymphadenopathy:    She has no cervical adenopathy.  Neurological: She is alert and oriented to person, place, and time.  Skin: Skin is warm and dry. No rash noted.  Psychiatric: She has a normal mood and affect. Her behavior is normal.   MAU Course  Procedures  MDM -- CMP, CBC essentially wnl, TSH also wnl -- U/S shows evidence of dehydration, SG 1.026 - also with some blood, RBCs noted (patient with history of renal stent) - will add on urine culture  -- normal orthostatic BPs - 107/50>106/54>98/61>102/61 with appropriate postural tachycardia  -- EKG with NSR, HR 66, normal R wave progression, no ischemic changes  -- message sent to clinic to schedule f/u for next week   Assessment and Plan  27yo [id number] at [redacted]w[redacted]d with PMH of gastric bypass and preeclampsia in prior pregnancy who presented with lightheadedness, headache, and fatigue. Symptoms most likely related to relative hypotension of pregnancy now that in  2nd trimester and decreased po intake in setting of gastric weight loss surgery earlier this year. Physical exam, vital signs reassuring and EKG wnl. Lab work-up with H/H, electrolytes within normal limits though low albumin and evidence of dehydration with elevated SG on U/A. Encouraged increased po intake and to have more protein, supplements if needed. Will  follow-up urine culture given some blood in urine - could also be related to history of kidney stones. Advised to continue Fioricet for headaches as prescribed. Return precautions reviewed. All questions answered prior to discharge.   Tamera Stands, DO  01/29/2018, 7:40 PM

## 2018-01-29 NOTE — MAU Note (Signed)
Gabrielle DraftsMelissa Roy is a 27 y.o. at 2381w1d here in MAU reporting: +headache +fatigue +dizziniess/feeling faint . Reports that BP at home was 98/34. Called her ob and they told her to come in. Onset of complaint: started around 3pm but had to wait to come due to childcare Pain score: 2/10 for headache. States she took Fioricet at home and it helped. Vitals:   01/29/18 1852  BP: 121/65  Pulse: 80  Resp: 16  Temp: 98 F (36.7 C)  SpO2: 100%      Lab orders placed from triage: ua

## 2018-01-29 NOTE — Discharge Instructions (Signed)
·   Drink lots of water over the rest of the weekend and be sure to get in some electrolytes as well - soup, citrus in your water, a few salty snacks  Okay not to check your blood pressure regularly at home  Take regular medication for headache and follow-up in clinic next week if not helping   If lightheadedness gets worse, please return to MAU or clinic

## 2018-01-31 ENCOUNTER — Telehealth: Payer: Self-pay | Admitting: *Deleted

## 2018-01-31 LAB — CULTURE, OB URINE

## 2018-01-31 NOTE — Telephone Encounter (Signed)
Left patient a message to call the office to schedule a F/U appointment from MAU.

## 2018-02-01 ENCOUNTER — Ambulatory Visit (INDEPENDENT_AMBULATORY_CARE_PROVIDER_SITE_OTHER): Payer: Self-pay | Admitting: Advanced Practice Midwife

## 2018-02-01 VITALS — BP 112/57 | HR 71 | Wt 193.0 lb

## 2018-02-01 DIAGNOSIS — O34219 Maternal care for unspecified type scar from previous cesarean delivery: Secondary | ICD-10-CM

## 2018-02-01 DIAGNOSIS — Z3A15 15 weeks gestation of pregnancy: Secondary | ICD-10-CM

## 2018-02-01 DIAGNOSIS — R51 Headache: Secondary | ICD-10-CM

## 2018-02-01 DIAGNOSIS — O26892 Other specified pregnancy related conditions, second trimester: Secondary | ICD-10-CM

## 2018-02-01 DIAGNOSIS — O26899 Other specified pregnancy related conditions, unspecified trimester: Secondary | ICD-10-CM

## 2018-02-01 DIAGNOSIS — R300 Dysuria: Secondary | ICD-10-CM

## 2018-02-01 DIAGNOSIS — Z3480 Encounter for supervision of other normal pregnancy, unspecified trimester: Secondary | ICD-10-CM

## 2018-02-01 LAB — POCT URINALYSIS DIPSTICK OB
Bilirubin, UA: NEGATIVE
GLUCOSE, UA: NEGATIVE
Ketones, UA: NEGATIVE
LEUKOCYTES UA: NEGATIVE
Nitrite, UA: NEGATIVE
POC,PROTEIN,UA: NEGATIVE
Spec Grav, UA: >=1.03 — AB (ref 1.010–1.025)
Urobilinogen, UA: NEGATIVE E.U./dL — AB
pH, UA: 7 (ref 5.0–8.0)

## 2018-02-01 NOTE — Patient Instructions (Signed)

## 2018-02-01 NOTE — Progress Notes (Signed)
Pt c/o headaches and feeling dizzy

## 2018-02-01 NOTE — Progress Notes (Signed)
   PRENATAL VISIT NOTE  Subjective:  Gabrielle Roy is a 27 y.o. G3P1102 at 10446w4d being seen today for ongoing prenatal care.  She is currently monitored for the following issues for this low-risk pregnancy and has Supervision of normal pregnancy, antepartum; Anxiety in pregnancy, antepartum; Depressive disorder, not elsewhere classified; History of gestational hypertension; Gastric bypass status for obesity; and Previous cesarean section complicating pregnancy, antepartum condition or complication on their problem list.  Patient reports headache and visual changes intermittently, mild dysuria.  Contractions: Not present. Vag. Bleeding: None.  Movement: Absent. Denies leaking of fluid.   The following portions of the patient's history were reviewed and updated as appropriate: allergies, current medications, past family history, past medical history, past social history, past surgical history and problem list. Problem list updated.  Objective:   Vitals:   02/01/18 1020  BP: (!) 112/57  Pulse: 71  Weight: 87.5 kg    Fetal Status: Fetal Heart Rate (bpm): 145   Movement: Absent     General:  Alert, oriented and cooperative. Patient is in no acute distress.  Skin: Skin is warm and dry. No rash noted.   Cardiovascular: Normal heart rate noted  Respiratory: Normal respiratory effort, no problems with respiration noted  Abdomen: Soft, gravid, appropriate for gestational age.  Pain/Pressure: Present     Pelvic: Cervical exam deferred        Extremities: Normal Roy of motion.  Edema: None  Mental Status: Normal mood and affect. Normal behavior. Normal judgment and thought content.   Assessment and Plan:  Pregnancy: G3P1102 at 4946w4d  1. Supervision of other normal pregnancy, antepartum --Anticipatory guidance about next visits/weeks of pregnancy given.  2. Previous cesarean section complicating pregnancy, antepartum condition or complication --C/S x 2, plans repeat.  3. Dysuria during  pregnancy, antepartum --Will wait for culture, treat as needed. - Culture, OB Urine - POC Urinalysis Dipstick OB  4. Headache in pregnancy, antepartum, second trimester --Intermittent h/a resolve with Fioricet but return. She has hx gastric sleeve so is unable to drink large amount of liquids and eat enough food.   --Encouraged liquid foods like smoothies, protein shakes to get hydration and nutrition. --May use caffeine like soda/tea with or without Tylenol for h/a instead of Fioricet medication as needed. --Try comfort measures/self care like relaxation, rest, quiet time, music, etc. --F/U in 2 weeks to see if h/a improving. Consider Ensure PRN.  Preterm labor symptoms and general obstetric precautions including but not limited to vaginal bleeding, contractions, leaking of fluid and fetal movement were reviewed in detail with the patient. Please refer to After Visit Summary for other counseling recommendations.  No follow-ups on file.  Future Appointments  Date Time Provider Department Center  02/15/2018  9:45 AM Kendell BaneLeftwich-Kirby, Samary Shatz A, CNM [facility]  02/18/2018 11:30 AM [facility] US 5 [facility]  02/22/2018  9:15 AM Leftwich-Kirby, Wilmer FloorLisa A, CNM [facility]    Sharen CounterLisa Leftwich-Kirby, CNM

## 2018-02-03 LAB — URINE CULTURE, OB REFLEX

## 2018-02-03 LAB — CULTURE, OB URINE

## 2018-02-11 ENCOUNTER — Encounter (HOSPITAL_COMMUNITY): Payer: Self-pay

## 2018-02-15 ENCOUNTER — Ambulatory Visit (INDEPENDENT_AMBULATORY_CARE_PROVIDER_SITE_OTHER): Admitting: Certified Nurse Midwife

## 2018-02-15 VITALS — BP 98/51 | HR 60 | Wt 191.0 lb

## 2018-02-15 DIAGNOSIS — R519 Headache, unspecified: Secondary | ICD-10-CM

## 2018-02-15 DIAGNOSIS — Z3480 Encounter for supervision of other normal pregnancy, unspecified trimester: Secondary | ICD-10-CM

## 2018-02-15 DIAGNOSIS — R51 Headache: Secondary | ICD-10-CM

## 2018-02-15 DIAGNOSIS — Z8759 Personal history of other complications of pregnancy, childbirth and the puerperium: Secondary | ICD-10-CM | POA: Diagnosis not present

## 2018-02-15 DIAGNOSIS — Z3482 Encounter for supervision of other normal pregnancy, second trimester: Secondary | ICD-10-CM

## 2018-02-15 DIAGNOSIS — O34219 Maternal care for unspecified type scar from previous cesarean delivery: Secondary | ICD-10-CM

## 2018-02-15 DIAGNOSIS — O26892 Other specified pregnancy related conditions, second trimester: Secondary | ICD-10-CM

## 2018-02-15 NOTE — Progress Notes (Signed)
Subjective:  Gabrielle Roy is a 27 y.o. G3P1102 at 622w4d being seen today for ongoing prenatal care.  She is currently monitored for the following issues for this low-risk pregnancy and has Supervision of normal pregnancy, antepartum; Anxiety in pregnancy, antepartum; Depressive disorder, not elsewhere classified; History of gestational hypertension; Gastric bypass status for obesity; and Previous cesarean section complicating pregnancy, antepartum condition or complication on their problem list.  Patient reports headache. Reports daily HA. Using Fioricet and helps some. When HA occurs she has dizziness and loss of vision. Had syncope once. Does not know triggers. Hx of gastric bypass, tries to hydrate well and eats protein. Contractions: Not present. Vag. Bleeding: None.  Movement: Absent. Denies leaking of fluid.   The following portions of the patient's history were reviewed and updated as appropriate: allergies, current medications, past family history, past medical history, past social history, past surgical history and problem list. Problem list updated.  Objective:   Vitals:   02/15/18 0953  BP: (!) 98/51  Pulse: 60  Weight: 86.6 kg    Fetal Status: Fetal Heart Rate (bpm): 147 Fundal Height: 17 cm Movement: Absent     General:  Alert, oriented and cooperative. Patient is in no acute distress.  Skin: Skin is warm and dry. No rash noted.   Cardiovascular: Normal heart rate noted  Respiratory: Normal respiratory effort, no problems with respiration noted  Abdomen: Soft, gravid, appropriate for gestational age. Pain/Pressure: Absent     Pelvic: Vag. Bleeding: None Vag D/C Character: Thin   Cervical exam deferred        Extremities: Normal range of motion.  Edema: None  Mental Status: Normal mood and affect. Normal behavior. Normal judgment and thought content.  Neuro: normal, no cranial nerve deficit  Urinalysis:      Assessment and Plan:  Pregnancy: G3P1102 at 572w4d  1. History  of gestational hypertension - BP normal (low) - cannot use bASA d/t gastric bypass  2. Previous cesarean section complicating pregnancy, antepartum condition or complication - planning repeat  3. Supervision of other normal pregnancy, antepartum - anatomy US this week  4. Pregnancy headache in second trimester - normal neuro exam - Ambulatory referral to Neurology - will check BS at home during episodes of HA, for hypoglycemia  Preterm labor symptoms and general obstetric precautions including but not limited to vaginal bleeding, contractions, leaking of fluid and fetal movement were reviewed in detail with the patient. Please refer to After Visit Summary for other counseling recommendations.  Return in about 4 weeks (around 03/15/2018).   Donette LarryBhambri, Asher Torpey, CNM

## 2018-02-15 NOTE — Progress Notes (Signed)
Still having H/A's everday

## 2018-02-16 LAB — ALPHA FETOPROTEIN, MATERNAL
AFP MoM: 1.01
AFP, Serum: 34.9 ng/mL
Calc'd Gestational Age: 17.6 weeks
Maternal Wt: 191 [lb_av]
TWINS-AFP: 1

## 2018-02-18 ENCOUNTER — Other Ambulatory Visit: Payer: Self-pay | Admitting: Advanced Practice Midwife

## 2018-02-18 ENCOUNTER — Other Ambulatory Visit (HOSPITAL_COMMUNITY): Payer: Self-pay | Admitting: *Deleted

## 2018-02-18 ENCOUNTER — Ambulatory Visit (HOSPITAL_COMMUNITY)
Admission: RE | Admit: 2018-02-18 | Discharge: 2018-02-18 | Disposition: A | Discharge status: Home / Self Care | Source: Ambulatory Visit | Attending: Advanced Practice Midwife | Admitting: Advanced Practice Midwife

## 2018-02-18 ENCOUNTER — Encounter (HOSPITAL_COMMUNITY): Payer: Self-pay

## 2018-02-18 DIAGNOSIS — O4402 Placenta previa specified as without hemorrhage, second trimester: Secondary | ICD-10-CM

## 2018-02-18 DIAGNOSIS — O99842 Bariatric surgery status complicating pregnancy, second trimester: Secondary | ICD-10-CM

## 2018-02-18 DIAGNOSIS — Z3A19 19 weeks gestation of pregnancy: Secondary | ICD-10-CM

## 2018-02-18 DIAGNOSIS — O09292 Supervision of pregnancy with other poor reproductive or obstetric history, second trimester: Secondary | ICD-10-CM

## 2018-02-18 DIAGNOSIS — Z363 Encounter for antenatal screening for malformations: Secondary | ICD-10-CM | POA: Diagnosis not present

## 2018-02-18 DIAGNOSIS — O09212 Supervision of pregnancy with history of pre-term labor, second trimester: Secondary | ICD-10-CM

## 2018-02-18 DIAGNOSIS — Z362 Encounter for other antenatal screening follow-up: Secondary | ICD-10-CM

## 2018-02-18 DIAGNOSIS — O34219 Maternal care for unspecified type scar from previous cesarean delivery: Secondary | ICD-10-CM | POA: Diagnosis not present

## 2018-02-18 DIAGNOSIS — O99212 Obesity complicating pregnancy, second trimester: Secondary | ICD-10-CM

## 2018-02-18 DIAGNOSIS — Z3A18 18 weeks gestation of pregnancy: Secondary | ICD-10-CM | POA: Diagnosis not present

## 2018-02-22 ENCOUNTER — Telehealth: Payer: Self-pay | Admitting: *Deleted

## 2018-02-22 ENCOUNTER — Ambulatory Visit: Admitting: Diagnostic Neuroimaging

## 2018-02-22 ENCOUNTER — Encounter: Payer: Self-pay | Admitting: Advanced Practice Midwife

## 2018-02-22 DIAGNOSIS — O4400 Placenta previa specified as without hemorrhage, unspecified trimester: Secondary | ICD-10-CM | POA: Insufficient documentation

## 2018-02-22 NOTE — Telephone Encounter (Signed)
Patient was no show for new patient appointment today. 

## 2018-02-23 ENCOUNTER — Encounter: Payer: Self-pay | Admitting: Diagnostic Neuroimaging

## 2018-03-10 ENCOUNTER — Ambulatory Visit (INDEPENDENT_AMBULATORY_CARE_PROVIDER_SITE_OTHER): Admitting: Neurology

## 2018-03-10 ENCOUNTER — Encounter: Payer: Self-pay | Admitting: Neurology

## 2018-03-10 DIAGNOSIS — G43019 Migraine without aura, intractable, without status migrainosus: Secondary | ICD-10-CM | POA: Diagnosis not present

## 2018-03-10 HISTORY — DX: Migraine without aura, intractable, without status migrainosus: G43.019

## 2018-03-10 MED ORDER — PROMETHAZINE HCL 25 MG PO TABS: 25.0000 mg | ORAL_TABLET | Freq: Four times a day (QID) | ORAL | 2 refills | Status: DC | PRN

## 2018-03-10 MED ORDER — AMITRIPTYLINE HCL 10 MG PO TABS: ORAL_TABLET | ORAL | 3 refills | Status: DC

## 2018-03-10 NOTE — Patient Instructions (Signed)
We will start amitriptyline for the headache.  Elavil (amitriptyline) is an antidepressant medication that has many uses that may include headache, whiplash injuries, or for peripheral neuropathy pain. Side effects may include drowsiness, dry mouth, blurred vision, or constipation. As with any antidepressant medication, worsening depression may occur. If you had any significant side effects, please call our office. The full effects of this medication may take 7-10 days after starting the drug, or going up on the dose.   

## 2018-03-10 NOTE — Progress Notes (Signed)
Reason for visit: Migraine headache  Referring physician: Dr. Adan Sis is a 28 y.o. female  History of present illness:  Ms. Schlepp is a 28 year old right-handed white female with a history of current pregnancy, due date is on 22 Jul 2018.  The patient has had headaches since she was a teenager, her usual headache frequency is 3-4 headaches a week, but since her pregnancy the headaches have been daily in nature.  The headaches are mainly in the bifrontal regions, and may be associated with blurred vision and some nausea and occasional vomiting.  The patient is having very frequent periods of incapacity because of the headache currently, she is having a lot of difficulty being able to care for her 2 other children.  The patient does not note any particular activating factors for headache.  She denies any family history of headache.  She reports no numbness or weakness of the face, arms, legs.  She denies any neck stiffness or sinus drainage.  She currently is taking Fioricet for her headache if needed, she may occasionally take Tylenol.  She drinks on average one caffeinated soda daily, no other caffeine intake.  Past Medical History:  Diagnosis Date  . Kidney stone    Stent placed in Feb 2017  . PONV (postoperative nausea and vomiting)    nausea during last c section  . Supervision of normal pregnancy, antepartum 10/04/2015    Clinic  [place] - transfer from Dr. Shawnie Pons Prenatal Labs Dating  Blood type:    Genetic Screen 1 Screen:    AFP:     Quad:     NIPS: Antibody:  Anatomic [place]  Nml fetal anatomy @ 18 wks; low lying placenta > resolved @23  wks Rubella:   GTT Early:               Third trimester:  RPR:    Flu vaccine  Declined HBsAg:    TDaP vaccine                                               Rhogam: HIV:    Baby Food  Breastfeed                                            GBS: (For PCN allergy, check sensitivities) Contraception  Pap: Circumcision  n/a female ([facility]  ultrasound)  Pediatrician   Support Person  [name] (husband)      Past Surgical History:  Procedure Laterality Date  . CESAREAN SECTION    . CESAREAN SECTION N/A 02/20/2016   Procedure: REPEAT CESAREAN SECTION;  Surgeon: Hermina Staggers, MD;  Location: [facility]ING SUIT[facility];  Service: Obstetrics;  Laterality: N/A;  . FINGER SURGERY    . GASTRIC BYPASS    . kidney stint      Family History  Problem Relation Age of Onset  . Hypertension Father   . Heart disease Father   . Hearing loss Father     Social history:  reports that she has quit smoking. She has never used smokeless tobacco. She reports that she does not drink alcohol or use drugs.  Medications:  Prior to Admission medications   Medication Sig Start Date End Date Taking? Authorizing Provider  Butalbital-APAP-Caffeine 50-325-40 MG capsule Take 1-2 capsules by mouth every 6 (six) hours as needed for headache. 01/21/18  Yes Constant, Peggy, MD  Prenatal Vit-Fe Fumarate-FA (PRENATAL VITAMIN PO) Take by mouth.   Yes [provider]      Allergies  Allergen Reactions  . Alcohol Hives    Allergic to alcohol containing products including cough medicines that contain alcohol  . Flagyl [Metronidazole] Other (See Comments)    Facial flushing  . Hydrocodone-Acetaminophen Nausea And Vomiting    ROS:  Out of a complete 14 system review of symptoms, the patient complains only of the following symptoms, and all other reviewed systems are negative.  Memory loss, headache, dizziness  Blood pressure 106/74, pulse 68, height 5\' 2"  (1.575 m), weight 187 lb (84.8 kg), last menstrual period 10/21/2017, SpO2 96 %.  Physical Exam  General: The patient is alert and cooperative at the time of the examination.  The patient currently is pregnant.  Eyes: Pupils are equal, round, and reactive to light. Discs are flat bilaterally.  Neck: The neck is supple, no carotid bruits are noted.  Respiratory: The respiratory examination is  clear.  Cardiovascular: The cardiovascular examination reveals a regular rate and rhythm, no obvious murmurs or rubs are noted.  Skin: Extremities are without significant edema.  Neurologic Exam  Mental status: The patient is alert and oriented x 3 at the time of the examination. The patient has apparent normal recent and remote memory, with an apparently normal attention span and concentration ability.  Cranial nerves: Facial symmetry is present. There is good sensation of the face to pinprick and soft touch bilaterally. The strength of the facial muscles and the muscles to head turning and shoulder shrug are normal bilaterally. Speech is well enunciated, no aphasia or dysarthria is noted. Extraocular movements are full. Visual fields are full. The tongue is midline, and the patient has symmetric elevation of the soft palate. No obvious hearing deficits are noted.  Motor: The motor testing reveals 5 over 5 strength of all 4 extremities. Good symmetric motor tone is noted throughout.  Sensory: Sensory testing is intact to pinprick, soft touch, vibration sensation, and position sense on all 4 extremities. No evidence of extinction is noted.  Coordination: Cerebellar testing reveals good finger-nose-finger and heel-to-shin bilaterally.  Gait and station: Gait is normal. Tandem gait is normal. Romberg is negative. No drift is seen.  Reflexes: Deep tendon reflexes are symmetric and normal bilaterally. Toes are downgoing bilaterally.   Assessment/Plan:  1.  Intractable migraine headache  2.  Pregnancy, due date Jul 22, 2018  The patient will be started on low-dose amitriptyline working up to 30 mg at night.  She was given a prescription for Phenergan.  The patient has an allergy to hydrocodone.  The patient will call for any dose adjustments of the amitriptyline, she will follow-up in 3 months.   Marlan Palau MD 03/10/2018 2:21 PM  [facility] [address]  [address] [place], [place] [address]  Phone [phone] Fax [phone]

## 2018-03-15 ENCOUNTER — Encounter: Payer: Self-pay | Admitting: Certified Nurse Midwife

## 2018-03-15 ENCOUNTER — Ambulatory Visit (INDEPENDENT_AMBULATORY_CARE_PROVIDER_SITE_OTHER): Admitting: Certified Nurse Midwife

## 2018-03-15 VITALS — BP 119/64 | HR 89 | Wt 187.0 lb

## 2018-03-15 DIAGNOSIS — Z8759 Personal history of other complications of pregnancy, childbirth and the puerperium: Secondary | ICD-10-CM

## 2018-03-15 DIAGNOSIS — O34219 Maternal care for unspecified type scar from previous cesarean delivery: Secondary | ICD-10-CM

## 2018-03-15 DIAGNOSIS — Z3480 Encounter for supervision of other normal pregnancy, unspecified trimester: Secondary | ICD-10-CM

## 2018-03-15 DIAGNOSIS — O4402 Placenta previa specified as without hemorrhage, second trimester: Secondary | ICD-10-CM

## 2018-03-15 DIAGNOSIS — G43019 Migraine without aura, intractable, without status migrainosus: Secondary | ICD-10-CM

## 2018-03-15 DIAGNOSIS — O4400 Placenta previa specified as without hemorrhage, unspecified trimester: Secondary | ICD-10-CM

## 2018-03-15 DIAGNOSIS — Z3482 Encounter for supervision of other normal pregnancy, second trimester: Secondary | ICD-10-CM

## 2018-03-15 MED ORDER — BUTALBITAL-APAP-CAFFEINE 50-325-40 MG PO CAPS: 1.0000 | ORAL_CAPSULE | Freq: Four times a day (QID) | ORAL | 1 refills | Status: DC | PRN

## 2018-03-15 NOTE — Progress Notes (Signed)
Needs RF on headache med

## 2018-03-15 NOTE — Patient Instructions (Addendum)
Peptic Ulcer Eating Plan °Peptic ulcers are sores that form on the lining of the stomach, esophagus, or the part of the small intestine that is attached to the stomach (duodenum). These sores are also called stomach ulcers. When ulcers develop, they can cause a burning feeling in the stomach as well as bloating, nausea, vomiting, and poor appetite. °If you have a history of peptic ulcers, it is important to keep track of what foods and drinks cause symptoms. °What are tips for following this plan? ° °· Eat a healthy, well-balanced diet.  This includes: °? Fresh fruits and vegetables. Eat a variety of colors of fruits and vegetables. °? Whole grains. Try to make sure at least half of the grains you eat each day are whole grains. °? Low-fat dairy. °? Lean meat, fish, poultry, eggs, beans, and nuts. °? Healthy fats, such as olive oil, grapeseed oil, or canola oil. Try to eat less than 8 teaspoons of fats and oils each day. °· Avoid foods that cause irritation or pain. These may be different for different people. Keep a food diary to identify foods that cause symptoms. °· Avoid processed foods that have added salt and sugar. °· Avoid drinking alcohol. °· Avoid drinks with caffeine, such as cola, black tea, energy drinks, and coffee. °Recommended foods °Grains °· Whole grains. °Vegetables °· All fresh or frozen vegetables. Low-sodium canned vegetables. °Fruits °· All fresh, frozen, or dried fruit. Fruit canned in juice. °Meats and other protein foods °· Lean cuts of meat. Skinless poultry. Fresh or canned fish. Eggs. Tofu. Nuts and nut butter. Dried beans. Low-sodium canned beans. °Dairy °· Low-fat or nonfat (skim) milk. Nonfat or low-fat yogurt. Nonfat or low-fat cheese. °Beverages °· Water. Soy or nut milks. Caffeine-free soft drinks. Herbal tea. °Fats and oils °· Olive oil. Canola oil. Grapeseed oil. Sunflower oil. °Seasoning and other foods °· Low-fat salad dressing. Ketchup. Low-fat mayonnaise. All spices except  pepper. Low-sodium seasoning mixes. °Foods to avoid °Meats and other protein foods °· Fatty meats. Fried meats. Any meat that causes symptoms. °Dairy °· Whole milk. Ice cream. Cream. Chocolate milk. °Beverages °· Alcohol. Coffee. Cola and energy drinks. Black or green tea. Cocoa. °Fats and oils °· Butter. Lard. Ghee. °Seasoning and other foods °· Pepper. Hot sauce. Any seasonings or condiments that cause symptoms. °Summary °· Peptic ulcers can cause burning in the stomach as well as bloating, nausea, vomiting, and poor appetite. You may be able to limit symptoms by avoiding foods that make you feel worse. °· Work with your dietitian or health care provider to identify foods that cause symptoms. This may include caffeinated drinks, alcohol, or pepper. °This information is not intended to replace advice given to you by your health care provider. Make sure you discuss any questions you have with your health care provider. °Document Released: 05/18/2011 Document Revised: 04/06/2016 Document Reviewed: 04/06/2016 °Elsevier Interactive Patient Education © 2019 Elsevier Inc. °Peptic Ulcer ° °A peptic ulcer is a painful sore in the lining of your stomach or the first part of your small intestine. °What are the causes? °Common causes of this condition include: °· An infection. °· Using certain pain medicines too often or too much. °What increases the risk? °You are more likely to get this condition if you: °· Smoke. °· Have a family history of ulcer disease. °· Drink alcohol. °· Have been hospitalized in an intensive care unit (ICU). °What are the signs or symptoms? °Symptoms include: °· Burning pain in the area between the chest and   chest and the belly button. The pain may: ? Not go away (be persistent). ? Be worse when your stomach is empty. ? Be worse at night.  Heartburn.  Feeling sick to your stomach (nauseous) and throwing up (vomiting).  Bloating. If the ulcer results in bleeding, it can cause you to:  Have poop  (stool) that is black and looks like tar.  Throw up bright red blood.  Throw up material that looks like coffee grounds. How is this treated? Treatment for this condition may include:  Stopping things that can cause the ulcer, such as: ? Smoking. ? Using pain medicines.  Medicines to reduce stomach acid.  Antibiotic medicines if the ulcer is caused by an infection.  A procedure that is done using a small, flexible tube that has a camera at the end (upper endoscopy). This may be done if you have a bleeding ulcer.  Surgery. This may be needed if: ? You have a lot of bleeding. ? The ulcer caused a hole somewhere in the digestive system. Follow these instructions at home:  Do not drink alcohol if your doctor tells you not to drink.  Limit how much caffeine you take in.  Do not use any products that contain nicotine or tobacco, such as cigarettes, e-cigarettes, and chewing tobacco. If you need help quitting, ask your doctor.  Take over-the-counter and prescription medicines only as told by your doctor. ? Do not stop or change your medicines unless you talk with your doctor about it first. ? Do not take aspirin, ibuprofen, or other NSAIDs unless your doctor told you to do so.  Keep all follow-up visits as told by your doctor. This is important. Contact a doctor if:  You do not get better in 7 days after you start treatment.  You keep having an upset stomach (indigestion) or heartburn. Get help right away if:  You have sudden, sharp pain in your belly (abdomen).  You have belly pain that does not go away.  You have bloody poop (stool) or black, tarry poop.  You throw up blood. It may look like coffee grounds.  You feel light-headed or feel like you may pass out (faint).  You get weak.  You get sweaty or feel sticky and cold to the touch (clammy). Summary  Symptoms of a peptic ulcer include burning pain in the area between the chest and the belly button.  Take  medicines only as told by your doctor.  Limit how much alcohol and caffeine you have.  Keep all follow-up visits as told by your doctor. This information is not intended to replace advice given to you by your health care provider. Make sure you discuss any questions you have with your health care provider. Document Released: 05/20/2009 Document Revised: 08/31/2017 Document Reviewed: 08/31/2017 Elsevier Interactive Patient Education  2019 [employer].

## 2018-03-15 NOTE — Progress Notes (Signed)
PRENATAL VISIT NOTE  Subjective:  Gabrielle Roy is a 28 y.o. G3P1102 at [redacted]w[redacted]d being seen today for ongoing prenatal care.  She is currently monitored for the following issues for this low-risk pregnancy and has Supervision of normal pregnancy, antepartum; Anxiety in pregnancy, antepartum; Depressive disorder, not elsewhere classified; History of gestational hypertension; Gastric bypass status for obesity; Previous cesarean section complicating pregnancy, antepartum condition or complication; Placenta previa affecting delivery; and Common migraine with intractable migraine on their problem list.  Patient reports upper abdominal pain.  Contractions: Not present. Vag. Bleeding: None.  Movement: Present. Denies leaking of fluid.   The following portions of the patient's history were reviewed and updated as appropriate: allergies, current medications, past family history, past medical history, past social history, past surgical history and problem list. Problem list updated.  Objective:   Vitals:   03/15/18 1018  BP: 119/64  Pulse: 89  Weight: 187 lb (84.8 kg)    Fetal Status: Fetal Heart Rate (bpm): 143 Fundal Height: 20 cm Movement: Present     General:  Alert, oriented and cooperative. Patient is in no acute distress.  Skin: Skin is warm and dry. No rash noted.   Cardiovascular: Normal heart rate noted  Respiratory: Normal respiratory effort, no problems with respiration noted  Abdomen: Soft, gravid, appropriate for gestational age.  Pain/Pressure: Absent     Pelvic: Cervical exam deferred        Extremities: Normal range of motion.  Edema: None  Mental Status: Normal mood and affect. Normal behavior. Normal judgment and thought content.   Assessment and Plan:  Pregnancy: G3P1102 at [redacted]w[redacted]d  1. Supervision of other normal pregnancy, antepartum - Patient reports new onset of upper abdominal pain that started occurring 2-3 weeks ago. She describes the abdominal pain as burning aching  sensation. - Reports symptoms occur 30-40 minutes after she eats and at night, hx of gastric sleeve and denies having this concern before.  - Discussed eating habits, patient reports eating 90& of acidic foods. Educated on peptic ulcer disease as possibility based on clinical symptoms, educated on changes to diet to see if symptoms become better or worse, patient verbalizes understanding.  - If dietary changes does not help will prescribe medication  - Routine prenatal care and anticipatory guidance   2. History of gestational hypertension - BP stable   3. Placenta previa affecting delivery - Repeat US scheduled for 03/18/2018, discussed continuing pelvic rest restrictions until after resolution of previa   4. Previous cesarean section complicating pregnancy, antepartum condition or complication - Plans repeat   5. Common migraine with intractable migraine - Seen by Neuro on 03/10/2018, started on amitriptyline  - Patient reports HA have become better and are occurring less since initiation of medication. Follow up in 3 months with Neuro as scheduled  - Patient request refill on Fioricet as back up just in case symptoms worsen.  - Butalbital-APAP-Caffeine 50-325-40 MG capsule; Take 1-2 capsules by mouth every 6 (six) hours as needed for headache.  Dispense: 30 capsule; Refill: 1  Preterm labor symptoms and general obstetric precautions including but not limited to vaginal bleeding, contractions, leaking of fluid and fetal movement were reviewed in detail with the patient. Please refer to After Visit Summary for other counseling recommendations.  Return in about 4 weeks (around 04/12/2018) for ROB.  Future Appointments  Date Time Provider Department Center  03/18/2018  1:00 PM [facility] [place] 3 [facility]  04/12/2018  9:00 AM Sharyon Cable, CNM [facility]  06/09/2018 12:45 PM Glean Salvo, NP [facility] None    Sharyon Cable, CNM

## 2018-03-18 ENCOUNTER — Ambulatory Visit (HOSPITAL_COMMUNITY)
Admission: RE | Admit: 2018-03-18 | Discharge: 2018-03-18 | Disposition: A | Discharge status: Home / Self Care | Source: Ambulatory Visit | Attending: Certified Nurse Midwife | Admitting: Certified Nurse Midwife

## 2018-03-18 ENCOUNTER — Encounter (HOSPITAL_COMMUNITY): Payer: Self-pay

## 2018-03-18 ENCOUNTER — Other Ambulatory Visit (HOSPITAL_COMMUNITY): Payer: Self-pay | Admitting: *Deleted

## 2018-03-18 DIAGNOSIS — O99842 Bariatric surgery status complicating pregnancy, second trimester: Secondary | ICD-10-CM

## 2018-03-18 DIAGNOSIS — Z362 Encounter for other antenatal screening follow-up: Secondary | ICD-10-CM | POA: Diagnosis not present

## 2018-03-18 DIAGNOSIS — O09292 Supervision of pregnancy with other poor reproductive or obstetric history, second trimester: Secondary | ICD-10-CM

## 2018-03-18 DIAGNOSIS — O4402 Placenta previa specified as without hemorrhage, second trimester: Secondary | ICD-10-CM

## 2018-03-18 DIAGNOSIS — O34219 Maternal care for unspecified type scar from previous cesarean delivery: Secondary | ICD-10-CM

## 2018-03-18 DIAGNOSIS — O09212 Supervision of pregnancy with history of pre-term labor, second trimester: Secondary | ICD-10-CM

## 2018-03-18 DIAGNOSIS — Z8759 Personal history of other complications of pregnancy, childbirth and the puerperium: Secondary | ICD-10-CM | POA: Diagnosis present

## 2018-03-18 DIAGNOSIS — O4400 Placenta previa specified as without hemorrhage, unspecified trimester: Secondary | ICD-10-CM | POA: Diagnosis present

## 2018-03-18 DIAGNOSIS — O99212 Obesity complicating pregnancy, second trimester: Secondary | ICD-10-CM

## 2018-03-18 DIAGNOSIS — Z3A22 22 weeks gestation of pregnancy: Secondary | ICD-10-CM

## 2018-03-20 ENCOUNTER — Encounter: Payer: Self-pay | Admitting: Advanced Practice Midwife

## 2018-03-28 ENCOUNTER — Other Ambulatory Visit: Payer: Self-pay

## 2018-03-28 ENCOUNTER — Inpatient Hospital Stay (HOSPITAL_COMMUNITY)
Admission: AD | Admit: 2018-03-28 | Discharge: 2018-03-28 | Disposition: A | Discharge status: Home / Self Care | Source: Ambulatory Visit | Attending: Obstetrics and Gynecology | Admitting: Obstetrics and Gynecology

## 2018-03-28 ENCOUNTER — Encounter (HOSPITAL_COMMUNITY): Payer: Self-pay

## 2018-03-28 DIAGNOSIS — O4402 Placenta previa specified as without hemorrhage, second trimester: Secondary | ICD-10-CM

## 2018-03-28 DIAGNOSIS — M545 Low back pain: Secondary | ICD-10-CM | POA: Diagnosis not present

## 2018-03-28 DIAGNOSIS — O34219 Maternal care for unspecified type scar from previous cesarean delivery: Secondary | ICD-10-CM

## 2018-03-28 DIAGNOSIS — Z0371 Encounter for suspected problem with amniotic cavity and membrane ruled out: Secondary | ICD-10-CM

## 2018-03-28 DIAGNOSIS — O9989 Other specified diseases and conditions complicating pregnancy, childbirth and the puerperium: Secondary | ICD-10-CM

## 2018-03-28 DIAGNOSIS — Z87891 Personal history of nicotine dependence: Secondary | ICD-10-CM | POA: Diagnosis not present

## 2018-03-28 DIAGNOSIS — Z3A23 23 weeks gestation of pregnancy: Secondary | ICD-10-CM | POA: Insufficient documentation

## 2018-03-28 DIAGNOSIS — O26892 Other specified pregnancy related conditions, second trimester: Secondary | ICD-10-CM | POA: Diagnosis not present

## 2018-03-28 DIAGNOSIS — O99842 Bariatric surgery status complicating pregnancy, second trimester: Secondary | ICD-10-CM | POA: Insufficient documentation

## 2018-03-28 DIAGNOSIS — Z8759 Personal history of other complications of pregnancy, childbirth and the puerperium: Secondary | ICD-10-CM

## 2018-03-28 DIAGNOSIS — M549 Dorsalgia, unspecified: Secondary | ICD-10-CM

## 2018-03-28 DIAGNOSIS — O4400 Placenta previa specified as without hemorrhage, unspecified trimester: Secondary | ICD-10-CM

## 2018-03-28 DIAGNOSIS — O99891 Other specified diseases and conditions complicating pregnancy: Secondary | ICD-10-CM

## 2018-03-28 LAB — URINALYSIS, ROUTINE W REFLEX MICROSCOPIC
Bilirubin Urine: NEGATIVE
GLUCOSE, UA: NEGATIVE mg/dL
Hgb urine dipstick: NEGATIVE
KETONES UR: NEGATIVE mg/dL
LEUKOCYTES UA: NEGATIVE
NITRITE: NEGATIVE
PH: 5 (ref 5.0–8.0)
Protein, ur: NEGATIVE mg/dL
SPECIFIC GRAVITY, URINE: 1.027 (ref 1.005–1.030)

## 2018-03-28 LAB — AMNISURE RUPTURE OF MEMBRANE (ROM) NOT AT ARMC: Amnisure ROM: NEGATIVE

## 2018-03-28 NOTE — MAU Provider Note (Addendum)
History     CSN: [id number]  Arrival date and time: 03/28/18 1735   None     Chief Complaint  Patient presents with  . Back Pain  . Fall  . Rupture of Membranes   Gabrielle DraftsMelissa Roy is a 28 y.o. G3P1102 at 4040w3d presenting after a fall (last night) after leaking clear, odorless, fluid throughout the day. She denies contractions, cramping, or vaginal bleeding. The fall occurred after a "sciatic nerve spasm." The fall has caused low back pain (3/10 if sitting/laying, 8/10 if walking). She takes tylenol for the back pain, but declines anything stronger. She wears a maternity belt that helps (was not wearing at the time), and plans to see a chiropractor starting next week.    OB History    Gravida  3   Para  2   Term  1   Preterm  1   AB      Living  2     SAB      TAB      Ectopic      Multiple  0   Live Births  2           Past Medical History:  Diagnosis Date  . Common migraine with intractable migraine 03/10/2018  . Kidney stone    Stent placed in Feb 2017  . PONV (postoperative nausea and vomiting)    nausea during last c section  . Supervision of normal pregnancy, antepartum 10/04/2015    [place] transfer from Dr. Shawnie Ponsorn Prenatal Labs Dating  Blood type:    Genetic Screen 1 Screen:    AFP:     Quad:     NIPS: Antibody:  Anatomic US  Nml fetal anatomy @ 18 wks; low lying placenta > resolved @23  wks Rubella:   GTT Early:               Third trimester:  RPR:    Flu vaccine  Declined HBsAg:    TDaP vaccine                                               Rhogam: HIV:    Baby Food  Breastfeed                                            GBS: (For PCN allergy, check sensitivities) Contraception  Pap: Circumcision  n/a female ([facility] ultrasound)  Pediatrician   Support Person  [name] (husband)      Past Surgical History:  Procedure Laterality Date  . CESAREAN SECTION    . CESAREAN SECTION N/A 02/20/2016   Procedure: REPEAT CESAREAN SECTION;  Surgeon:  Hermina StaggersMichael L Ervin, MD;  [facility];  Service: Obstetrics;  Laterality: N/A;  . FINGER SURGERY    . GASTRIC BYPASS    . kidney stint      Family History  Problem Relation Age of Onset  . Hypertension Father   . Heart disease Father   . Hearing loss Father     Social History   Tobacco Use  . Smoking status: Former Games developermoker  . Smokeless tobacco: Never Used  Substance Use Topics  . Alcohol use: No  . Drug use: No    Allergies:  Allergies  Allergen Reactions  . Alcohol Hives    Allergic to alcohol containing products including cough medicines that contain alcohol  . Flagyl [Metronidazole] Other (See Comments)    Facial flushing  . Hydrocodone-Acetaminophen Nausea And Vomiting    Medications Prior to Admission  Medication Sig Dispense Refill Last Dose  . amitriptyline (ELAVIL) 10 MG tablet Take one tablet at night for one week, then take 2 tablets at night for one week, then take 3 tablets at night. 90 tablet 3 Taking  . Butalbital-APAP-Caffeine 50-325-40 MG capsule Take 1-2 capsules by mouth every 6 (six) hours as needed for headache. (Patient not taking: Reported on 03/18/2018) 30 capsule 1 Not Taking  . Prenatal Vit-Fe Fumarate-FA (PRENATAL VITAMIN PO) Take by mouth.   Taking  . promethazine (PHENERGAN) 25 MG tablet Take 1 tablet (25 mg total) by mouth every 6 (six) hours as needed for nausea or vomiting. (Patient not taking: Reported on 03/18/2018) 30 tablet 2 Not Taking    Review of Systems  Constitutional: Negative.  Negative for fever.  HENT: Negative.   Eyes: Negative.   Respiratory: Negative.  Negative for chest tightness and shortness of breath.   Cardiovascular: Negative.  Negative for chest pain.  Gastrointestinal: Negative.  Negative for abdominal distention, abdominal pain, constipation, diarrhea, nausea and vomiting.  Genitourinary: Positive for vaginal discharge. Negative for difficulty urinating, dysuria, flank pain, pelvic pain, vaginal bleeding  and vaginal pain.  Musculoskeletal: Positive for back pain and joint swelling (right medial side of knee mildly swollen from fall).  Skin: Negative.   Allergic/Immunologic: Negative.   Neurological: Positive for headaches (history of, better with prescription). Negative for dizziness, weakness and light-headedness.  Hematological: Negative.   Psychiatric/Behavioral: Negative.    Physical Exam   Blood pressure 112/66, pulse 82, temperature 98.2 F (36.8 C), temperature source Oral, resp. rate 18, weight 85.3 kg, last menstrual period 10/21/2017, SpO2 98 %.  Physical Exam  Nursing note and vitals reviewed. Constitutional: She is oriented to person, place, and time. She appears well-developed and well-nourished. No distress.  HENT:  Head: Normocephalic.  Neck: Normal range of motion.  Cardiovascular: Normal rate, regular rhythm and normal heart sounds.  No murmur heard. Respiratory: Effort normal and breath sounds normal. No respiratory distress.  GI: Soft. Bowel sounds are normal. She exhibits no distension. There is no abdominal tenderness.  Genitourinary:    Uterus normal.     Vaginal discharge (thin white odorless discharge seen in fornix) present.     Genitourinary Comments: Cervix visualized as closed, thick, posterior   Musculoskeletal: Normal range of motion.  Neurological: She is alert and oriented to person, place, and time.  Skin: Skin is warm and dry. She is not diaphoretic.  Psychiatric: She has a normal mood and affect. Her behavior is normal. Judgment and thought content normal.    MAU Course  Procedures  MDM Crist Fat test, Amnisure  Results for orders placed or performed during the hospital encounter of 03/28/18 (from the past 24 hour(s))  Urinalysis, Routine w reflex microscopic     Status: Abnormal   Collection Time: 03/28/18  6:20 PM  Result Value Ref Range   Color, Urine YELLOW YELLOW   APPearance HAZY (A) CLEAR   Specific Gravity, Urine 1.027 1.005 - 1.030    pH 5.0 5.0 - 8.0   Glucose, UA NEGATIVE NEGATIVE mg/dL   Hgb urine dipstick NEGATIVE NEGATIVE   Bilirubin Urine NEGATIVE NEGATIVE   Ketones, ur NEGATIVE NEGATIVE mg/dL   Protein, ur  NEGATIVE NEGATIVE mg/dL   Nitrite NEGATIVE NEGATIVE   Leukocytes, UA NEGATIVE NEGATIVE  Amnisure rupture of membrane (rom)not at [facility]     Status: None   Collection Time: 03/28/18  7:31 PM  Result Value Ref Range   Amnisure ROM NEGATIVE   Fern Negative per RN  Assessment and Plan  Completion of care assumed by R. Arita Missawson, CNM @ [address]  [address] [place], [place] 03/28/2018, 8:03 PM    CNM attestation:  I have seen and examined this patient and agree with above documentation in the SNM's note.   Gabrielle DraftsMelissa Roy is a 28 y.o. G3P1102 at 6767w3d reporting she fell after pain in her sciatic nerve caused her to fall to her RT knee. She reports (+) FM since the fall. She now has leaking of clear fluid that started today. She also complains of lower back pain. She has a low-lying placenta.   +FM, denies VB, contractions, vaginal discharge.  PE: Patient Vitals for the past 24 hrs:  BP Temp Temp src Pulse Resp SpO2 Weight  03/28/18 1801 112/66 98.2 F (36.8 C) Oral 82 18 98 % 188 lb (85.3 kg)   Gen: calm comfortable, NAD Resp: normal effort, no distress Heart: Regular rate Abd: Soft, NT, gravid, S=D  FHR: Baseline 140, moderate variability, 10x10 accels, no decels Toco: no UC's  ROS, labs, PMH reviewed  Orders Placed This Encounter  Procedures  . Urinalysis, Routine w reflex microscopic  . Amnisure rupture of membrane (rom)not at [facility]  . POCT fern test    MDM [facility] Crist FatFern  Amnisure   Results for orders placed or performed during the hospital encounter of 03/28/18 (from the past 24 hour(s))  Urinalysis, Routine w reflex microscopic     Status: Abnormal   Collection Time: 03/28/18  6:20 PM  Result Value Ref Range   Color, Urine YELLOW YELLOW   APPearance HAZY (A) CLEAR   Specific Gravity, Urine 1.027  1.005 - 1.030   pH 5.0 5.0 - 8.0   Glucose, UA NEGATIVE NEGATIVE mg/dL   Hgb urine dipstick NEGATIVE NEGATIVE   Bilirubin Urine NEGATIVE NEGATIVE   Ketones, ur NEGATIVE NEGATIVE mg/dL   Protein, ur NEGATIVE NEGATIVE mg/dL   Nitrite NEGATIVE NEGATIVE   Leukocytes, UA NEGATIVE NEGATIVE  Amnisure [facility]rom[facility]not [facility]     Status: None   Collection Time: 03/28/18  7:31 PM  Result Value Ref Range   Amnisure ROM NEGATIVE     Assessment: 1. Placenta previa affecting delivery   2. History of gestational hypertension   3. Previous cesarean section complicating pregnancy, antepartum condition or complication     Plan: - Discharge home  - PTL precautions - Follow-up as scheduled at your doctor's office for next prenatal visit or sooner as needed if symptoms worsen. - Return to maternity admissions if symptoms worsen  I personally was present during the history, physical exam, and medical decision-making activities of this service and have verified that the service and findings are accurately documented in the student's note.   Raelyn MoraDawson, Honour Schwieger, CNM 03/28/2018 8:31 PM

## 2018-03-28 NOTE — MAU Note (Signed)
Fell last night.  Felt baby move afterwards.  Has had some loss of fluid today. Having pain in low back and rt knee today. Has ? Partial previa/low lying. No bleeding.  Back pain is what caused the fall, having sciatic issues

## 2018-03-30 ENCOUNTER — Ambulatory Visit (INDEPENDENT_AMBULATORY_CARE_PROVIDER_SITE_OTHER): Admitting: Obstetrics and Gynecology

## 2018-03-30 VITALS — BP 109/56 | HR 77 | Wt 188.0 lb

## 2018-03-30 DIAGNOSIS — Z3480 Encounter for supervision of other normal pregnancy, unspecified trimester: Secondary | ICD-10-CM

## 2018-03-30 DIAGNOSIS — O4402 Placenta previa specified as without hemorrhage, second trimester: Secondary | ICD-10-CM

## 2018-03-30 DIAGNOSIS — O34219 Maternal care for unspecified type scar from previous cesarean delivery: Secondary | ICD-10-CM

## 2018-03-30 DIAGNOSIS — Z9884 Bariatric surgery status: Secondary | ICD-10-CM

## 2018-03-30 DIAGNOSIS — Z3482 Encounter for supervision of other normal pregnancy, second trimester: Secondary | ICD-10-CM

## 2018-03-30 DIAGNOSIS — S334XXA Traumatic rupture of symphysis pubis, initial encounter: Secondary | ICD-10-CM | POA: Insufficient documentation

## 2018-03-30 DIAGNOSIS — O4400 Placenta previa specified as without hemorrhage, unspecified trimester: Secondary | ICD-10-CM

## 2018-03-30 MED ORDER — CYCLOBENZAPRINE HCL 10 MG PO TABS: 10.0000 mg | ORAL_TABLET | Freq: Three times a day (TID) | ORAL | 1 refills | Status: DC | PRN

## 2018-03-30 NOTE — Progress Notes (Signed)
C/O's sharp shooting pains in vagina since yesterday. Glucose 250  Protein Trace  Otherwise normal

## 2018-03-30 NOTE — Progress Notes (Signed)
   PRENATAL VISIT NOTE  Subjective:  Gabrielle Roy is a 28 y.o. G3P1102 at [redacted]w[redacted]d being seen today for ongoing prenatal care.  She is currently monitored for the following issues for this high-risk pregnancy and has Supervision of normal pregnancy, antepartum; Anxiety in pregnancy, antepartum; Depressive disorder, not elsewhere classified; History of gestational hypertension; Gastric bypass status for obesity; Previous cesarean section complicating pregnancy, antepartum condition or complication; Placenta previa affecting delivery; Common migraine with intractable migraine; No leakage of amniotic fluid into vagina; Back pain in pregnancy; and Symphysis pubis disruption, initial encounter on their problem list.  Patient reports vaginal pain..  Contractions: Not present. Vag. Bleeding: None.  Movement: Present. Denies leaking of fluid.  Had a fall on Sunday. Did not fall on abdomen.   The following portions of the patient's history were reviewed and updated as appropriate: allergies, current medications, past family history, past medical history, past social history, past surgical history and problem list. Problem list updated.  Objective:   Vitals:   03/30/18 1002  BP: (!) 109/56  Pulse: 77  Weight: 188 lb (85.3 kg)    Fetal Status: Fetal Heart Rate (bpm): 147 Fundal Height: 23 cm Movement: Present     General:  Alert, oriented and cooperative. Patient is in no acute distress.  Skin: Skin is warm and dry. No rash noted.   Cardiovascular: Normal heart rate noted  Respiratory: Normal respiratory effort, no problems with respiration noted  Abdomen: Soft, gravid, appropriate for gestational age.  Pain/Pressure: Present     Pelvic: Cervical exam performed       Cervix appears closed, No discharge or blood noted. Digital exam deferred   Extremities: Normal range of motion.  Edema: None  Mental Status: Normal mood and affect. Normal behavior. Normal judgment and thought content.   Assessment  and Plan:  Pregnancy: G3P1102 at [redacted]w[redacted]d  1. Supervision of other normal pregnancy, antepartum  -Patient was told that she cannot do 2 hour GTT due to Gastric sleeve and risk of dumping syndrome. Per Up to date an alternate testing is recommended:  fasting and post prandial 1 or 2 hours after eating glucose monitoring  X 1 week.  Will discuss this with her at next visit. Discussed this with Dr. Adrian Blackwater who is agreeable to plan.   -A1C 4.9  2. Symphysis pubis disruption, initial encounter - Continue pregnancy support belt - RX flexeril   3. Gastric bypass status for obesity- sleeve  - Will need serial growth [place] - Cannot take BASA  - Referral to MFM   4. Previous cesarean section complicating pregnancy, antepartum condition or complication  C-section X 2/ plans repeat   5. Placenta previa affecting delivery - No bleeding    There are no diagnoses linked to this encounter. Preterm labor symptoms and general obstetric precautions including but not limited to vaginal bleeding, contractions, leaking of fluid and fetal movement were reviewed in detail with the patient. Please refer to After Visit Summary for other counseling recommendations.  Return in about 4 weeks (around 04/27/2018) for Come fast for glucose testing .  Future Appointments  Date Time Provider Department Center  04/27/2018  9:00 AM Jonquil Stubbe, Harolyn Rutherford, NP [facility]  05/13/2018  9:30 AM [facility] [place] 5 [facility]  06/09/2018 12:45 PM Glean Salvo, NP [facility] None      Venia Carbon, NP

## 2018-04-11 ENCOUNTER — Encounter (HOSPITAL_COMMUNITY): Payer: Self-pay | Admitting: *Deleted

## 2018-04-11 ENCOUNTER — Inpatient Hospital Stay (HOSPITAL_COMMUNITY)

## 2018-04-11 ENCOUNTER — Inpatient Hospital Stay (HOSPITAL_COMMUNITY)
Admission: AD | Admit: 2018-04-11 | Discharge: 2018-04-11 | Disposition: A | Discharge status: Home / Self Care | Attending: Obstetrics and Gynecology | Admitting: Obstetrics and Gynecology

## 2018-04-11 ENCOUNTER — Other Ambulatory Visit: Payer: Self-pay

## 2018-04-11 ENCOUNTER — Encounter: Admitting: Obstetrics & Gynecology

## 2018-04-11 DIAGNOSIS — O9989 Other specified diseases and conditions complicating pregnancy, childbirth and the puerperium: Secondary | ICD-10-CM | POA: Diagnosis not present

## 2018-04-11 DIAGNOSIS — Z87442 Personal history of urinary calculi: Secondary | ICD-10-CM | POA: Diagnosis not present

## 2018-04-11 DIAGNOSIS — R109 Unspecified abdominal pain: Secondary | ICD-10-CM | POA: Diagnosis present

## 2018-04-11 DIAGNOSIS — Z3A25 25 weeks gestation of pregnancy: Secondary | ICD-10-CM

## 2018-04-11 DIAGNOSIS — S334XXA Traumatic rupture of symphysis pubis, initial encounter: Secondary | ICD-10-CM | POA: Diagnosis not present

## 2018-04-11 DIAGNOSIS — O26892 Other specified pregnancy related conditions, second trimester: Secondary | ICD-10-CM | POA: Diagnosis not present

## 2018-04-11 DIAGNOSIS — B9689 Other specified bacterial agents as the cause of diseases classified elsewhere: Secondary | ICD-10-CM

## 2018-04-11 DIAGNOSIS — M549 Dorsalgia, unspecified: Secondary | ICD-10-CM

## 2018-04-11 DIAGNOSIS — O23592 Infection of other part of genital tract in pregnancy, second trimester: Secondary | ICD-10-CM | POA: Insufficient documentation

## 2018-04-11 DIAGNOSIS — N200 Calculus of kidney: Secondary | ICD-10-CM | POA: Diagnosis not present

## 2018-04-11 DIAGNOSIS — O4400 Placenta previa specified as without hemorrhage, unspecified trimester: Secondary | ICD-10-CM

## 2018-04-11 DIAGNOSIS — Z8759 Personal history of other complications of pregnancy, childbirth and the puerperium: Secondary | ICD-10-CM

## 2018-04-11 DIAGNOSIS — O4402 Placenta previa specified as without hemorrhage, second trimester: Secondary | ICD-10-CM

## 2018-04-11 DIAGNOSIS — Z881 Allergy status to other antibiotic agents status: Secondary | ICD-10-CM | POA: Diagnosis not present

## 2018-04-11 DIAGNOSIS — D509 Iron deficiency anemia, unspecified: Secondary | ICD-10-CM | POA: Insufficient documentation

## 2018-04-11 DIAGNOSIS — O99842 Bariatric surgery status complicating pregnancy, second trimester: Secondary | ICD-10-CM | POA: Insufficient documentation

## 2018-04-11 DIAGNOSIS — N760 Acute vaginitis: Secondary | ICD-10-CM

## 2018-04-11 DIAGNOSIS — O99012 Anemia complicating pregnancy, second trimester: Secondary | ICD-10-CM | POA: Insufficient documentation

## 2018-04-11 DIAGNOSIS — Z87891 Personal history of nicotine dependence: Secondary | ICD-10-CM | POA: Diagnosis not present

## 2018-04-11 DIAGNOSIS — O34219 Maternal care for unspecified type scar from previous cesarean delivery: Secondary | ICD-10-CM

## 2018-04-11 DIAGNOSIS — Z0371 Encounter for suspected problem with amniotic cavity and membrane ruled out: Secondary | ICD-10-CM

## 2018-04-11 LAB — CBC WITH DIFFERENTIAL/PLATELET
BASOS ABS: 0 10*3/uL (ref 0.0–0.1)
Basophils Relative: 0 %
Eosinophils Absolute: 0 10*3/uL (ref 0.0–0.5)
Eosinophils Relative: 1 %
HCT: 31.4 % — ABNORMAL LOW (ref 36.0–46.0)
HEMOGLOBIN: 10.5 g/dL — AB (ref 12.0–15.0)
LYMPHS ABS: 1.5 10*3/uL (ref 0.7–4.0)
LYMPHS PCT: 17 %
MCH: 33.4 pg (ref 26.0–34.0)
MCHC: 33.4 g/dL (ref 30.0–36.0)
MCV: 100 fL (ref 80.0–100.0)
Monocytes Absolute: 0.2 10*3/uL (ref 0.1–1.0)
Monocytes Relative: 2 %
NEUTROS PCT: 80 %
NRBC: 0 % (ref 0.0–0.2)
Neutro Abs: 7.1 10*3/uL (ref 1.7–7.7)
Platelets: 234 10*3/uL (ref 150–400)
RBC: 3.14 MIL/uL — ABNORMAL LOW (ref 3.87–5.11)
RDW: 13.2 % (ref 11.5–15.5)
WBC: 8.9 10*3/uL (ref 4.0–10.5)

## 2018-04-11 LAB — URINALYSIS, ROUTINE W REFLEX MICROSCOPIC
Bilirubin Urine: NEGATIVE
GLUCOSE, UA: NEGATIVE mg/dL
KETONES UR: NEGATIVE mg/dL
LEUKOCYTES UA: NEGATIVE
Nitrite: NEGATIVE
PH: 6.5 (ref 5.0–8.0)
Protein, ur: 30 mg/dL — AB
Specific Gravity, Urine: 1.02 (ref 1.005–1.030)

## 2018-04-11 LAB — WET PREP, GENITAL
SPERM: NONE SEEN
Trich, Wet Prep: NONE SEEN
Yeast Wet Prep HPF POC: NONE SEEN

## 2018-04-11 LAB — URINALYSIS, MICROSCOPIC (REFLEX)
Bacteria, UA: NONE SEEN
WBC, UA: NONE SEEN WBC/hpf (ref 0–5)

## 2018-04-11 LAB — POCT FERN TEST: POCT Fern Test: NEGATIVE

## 2018-04-11 MED ORDER — ONDANSETRON 4 MG PO TBDP
4.0000 mg | ORAL_TABLET | Freq: Once | ORAL | Status: AC
  Administered 2018-04-11: 4 mg via ORAL
  Filled 2018-04-11: qty 1

## 2018-04-11 MED ORDER — OXYCODONE-ACETAMINOPHEN 5-325 MG PO TABS: 1.0000 | ORAL_TABLET | ORAL | 0 refills | Status: DC | PRN

## 2018-04-11 MED ORDER — TAMSULOSIN HCL 0.4 MG PO CAPS: 0.4000 mg | ORAL_CAPSULE | Freq: Every day | ORAL | 2 refills | Status: DC

## 2018-04-11 MED ORDER — CLINDAMYCIN PHOSPHATE 1 % EX GEL: Freq: Two times a day (BID) | CUTANEOUS | 0 refills | Status: DC

## 2018-04-11 MED ORDER — DIPHENHYDRAMINE HCL 25 MG PO TABS: 25.0000 mg | ORAL_TABLET | Freq: Four times a day (QID) | ORAL | 0 refills | Status: DC | PRN

## 2018-04-11 NOTE — MAU Provider Note (Addendum)
History   Chief Complaint  Patient presents with  . Abdominal Pain  . Back Pain   Gabrielle Roy is a 28 y.o. G3P1102 at 6528w3d who presents to MAU today for evaluation of left sided abdominal pain that woke her up from her sleep this morning at 7am. She describes the pain as severe, sharp, and constant, worsening randomly in waves and radiating to mid abdomen. Patient reports a history of kidney stones years ago but claims that this pain feels different. She has taken tylenol 1,000mg  without much relief. Patient also reports LOF that soaked through her underwear and car seat on her way to MAU but questions if it was urine. She claims that her most severe pain occurred concurrently with the LOF. She states that the pain has improved since and now rates it 5/10.   Patient reports chills, hot flashes, nausea and retching. +FM. Denies fever, urinary symptoms, change in bowel habits, and vaginal bleeding.  Last sexual intercourse was 2 weeks ago.    Past Medical History:  Diagnosis Date  . Common migraine with intractable migraine 03/10/2018  . Kidney stone    Stent placed in Feb 2017  . PONV (postoperative nausea and vomiting)    nausea during last c section  . Supervision of normal pregnancy, antepartum 10/04/2015    [place] transfer from Dr. Shawnie Ponsorn Prenatal Labs Dating  Blood type:    Genetic Screen 1 Screen:    AFP:     Quad:     NIPS: Antibody:  Anatomic US  Nml fetal anatomy @ 18 wks; low lying placenta > resolved @23  wks Rubella:   GTT Early:               Third trimester:  RPR:    Flu vaccine  Declined HBsAg:    TDaP vaccine                                               Rhogam: HIV:    Baby Food  Breastfeed                                            GBS: (For PCN allergy, check sensitivities) Contraception  Pap: Circumcision  n/a female ([facility] ultrasound)  Pediatrician   Support Person  [name] (husband)      Past Surgical History:  Procedure Laterality Date  . CESAREAN  SECTION    . CESAREAN SECTION N/A 02/20/2016   Procedure: REPEAT CESAREAN SECTION;  Surgeon: Hermina StaggersMichael L Ervin, MD;  [facility];  Service: Obstetrics;  Laterality: N/A;  . FINGER SURGERY    . GASTRIC BYPASS    . kidney stint      Family History  Problem Relation Age of Onset  . Hypertension Father   . Heart disease Father   . Hearing loss Father     Social History   Tobacco Use  . Smoking status: Former Games developermoker  . Smokeless tobacco: Never Used  Substance Use Topics  . Alcohol use: No  . Drug use: No    Allergies:  Allergies  Allergen Reactions  . Alcohol Hives    Allergic to alcohol containing products including cough medicines that contain alcohol  . Flagyl [Metronidazole] Other (See  Comments)    Facial flushing  . Hydrocodone-Acetaminophen Nausea And Vomiting    Medications Prior to Admission  Medication Sig Dispense Refill Last Dose  . amitriptyline (ELAVIL) 10 MG tablet Take one tablet at night for one week, then take 2 tablets at night for one week, then take 3 tablets at night. 90 tablet 3 Taking  . Butalbital-APAP-Caffeine 50-325-40 MG capsule Take 1-2 capsules by mouth every 6 (six) hours as needed for headache. 30 capsule 1 Taking  . cyclobenzaprine (FLEXERIL) 10 MG tablet Take 1 tablet (10 mg total) by mouth every 8 (eight) hours as needed for muscle spasms. 30 tablet 1   . Prenatal Vit-Fe Fumarate-FA (PRENATAL VITAMIN PO) Take by mouth.   Taking  . promethazine (PHENERGAN) 25 MG tablet Take 1 tablet (25 mg total) by mouth every 6 (six) hours as needed for nausea or vomiting. (Patient not taking: Reported on 03/18/2018) 30 tablet 2 Not Taking    Review of Systems  Constitutional: Positive for chills and diaphoresis. Negative for fever.  Gastrointestinal: Positive for abdominal pain, diarrhea (claims this is her normal d/t gastric sleeve) and nausea. Negative for blood in stool, constipation and vomiting.  Genitourinary: Negative for dysuria,  flank pain, frequency, hematuria and urgency.       Denies VB.    Physical Exam Blood pressure 138/84, pulse 86, temperature 98.2 F (36.8 C), temperature source Oral, resp. rate 20, weight 84.6 kg, last menstrual period 10/21/2017, SpO2 100 %. Physical Exam  Constitutional: She is oriented to person, place, and time. She appears well-developed and well-nourished. No distress.  Appears minimally uncomfortable throughout interview  HENT:  Head: Normocephalic.  Neck: Normal range of motion.  Cardiovascular: Normal rate, regular rhythm and normal heart sounds. Exam reveals no gallop and no friction rub.  No murmur heard. Respiratory: Effort normal and breath sounds normal. No respiratory distress.  GI: Soft. Bowel sounds are normal. She exhibits no distension. There is abdominal tenderness (abdominal pain felt in left lumbar region with direct palpation and with palpation of umbilical region. Palpation associated with increased nausea). There is no rebound, no guarding, no CVA tenderness and no tenderness at McBurney's point.  Genitourinary:    Vagina and uterus normal.     Genitourinary Comments: Minimal white, clumpy cervical discharge. No pooling noted. Cervix is closed, no CMT or adnexal tenderness.   Neurological: She is alert and oriented to person, place, and time.  Skin: Skin is warm and dry. She is not diaphoretic.  Psychiatric: She has a normal mood and affect. Her behavior is normal.    MAU Course Pelvic exam- no pooling or cervical dilation Fern test to r/o PPROM- negative UA to assess for UTI/pyelo  - positive for hgb and protein Wet prep to assess for infection- positive clue cells GC and chlamydia to r/o infection- pending CBC to assess for pyelo- microcytic hypochromic anemia. WBC WNL, no left shift. Renal US to assess for kidney stone- positive for left urethral calculus with mild hydronephrosis  Patient received one dose of Zofran for nausea. Reports improvement with  this.  Results for orders placed or performed during the hospital encounter of 04/11/18 (from the past 24 hour(s))  Urinalysis, Routine w reflex microscopic     Status: Abnormal   Collection Time: 04/11/18  9:38 AM  Result Value Ref Range   Color, Urine YELLOW YELLOW   APPearance CLEAR CLEAR   Specific Gravity, Urine 1.020 1.005 - 1.030   pH 6.5 5.0 - 8.0  Glucose, UA NEGATIVE NEGATIVE mg/dL   Hgb urine dipstick LARGE (A) NEGATIVE   Bilirubin Urine NEGATIVE NEGATIVE   Ketones, ur NEGATIVE NEGATIVE mg/dL   Protein, ur 30 (A) NEGATIVE mg/dL   Nitrite NEGATIVE NEGATIVE   Leukocytes, UA NEGATIVE NEGATIVE  Urinalysis, Microscopic (reflex)     Status: None   Collection Time: 04/11/18  9:38 AM  Result Value Ref Range   RBC / HPF 11-20 0 - 5 RBC/hpf   WBC, UA NONE SEEN 0 - 5 WBC/hpf   Bacteria, UA NONE SEEN NONE SEEN   Squamous Epithelial / LPF 0-5 0 - 5  Fern Test     Status: Normal   Collection Time: 04/11/18 10:05 AM  Result Value Ref Range   POCT Fern Test Negative = intact amniotic membranes   Wet prep, genital     Status: Abnormal   Collection Time: 04/11/18 10:24 AM  Result Value Ref Range   Yeast Wet Prep HPF POC NONE SEEN NONE SEEN   Trich, Wet Prep NONE SEEN NONE SEEN   Clue Cells Wet Prep HPF POC PRESENT (A) NONE SEEN   WBC, Wet Prep HPF POC FEW (A) NONE SEEN   Sperm NONE SEEN   CBC with Differential/Platelet     Status: Abnormal   Collection Time: 04/11/18 10:54 AM  Result Value Ref Range   WBC 8.9 4.0 - 10.5 K/uL   RBC 3.14 (L) 3.87 - 5.11 MIL/uL   Hemoglobin 10.5 (L) 12.0 - 15.0 g/dL   HCT 16.1 (L) 09.6 - 04.5 %   MCV 100.0 80.0 - 100.0 fL   MCH 33.4 26.0 - 34.0 pg   MCHC 33.4 30.0 - 36.0 g/dL   RDW 40.9 81.1 - 91.4 %   Platelets 234 150 - 400 K/uL   nRBC 0.0 0.0 - 0.2 %   Neutrophils Relative % 80 %   Neutro Abs 7.1 1.7 - 7.7 K/uL   Lymphocytes Relative 17 %   Lymphs Abs 1.5 0.7 - 4.0 K/uL   Monocytes Relative 2 %   Monocytes Absolute 0.2 0.1 - 1.0 K/uL    Eosinophils Relative 1 %   Eosinophils Absolute 0.0 0.0 - 0.5 K/uL   Basophils Relative 0 %   Basophils Absolute 0.0 0.0 - 0.1 K/uL     STUDENT MDM Gabrielle Roy is a 28 y.o. [id number] at [redacted]w[redacted]d who presents to MAU with acute onset left sided sharp abdominal pain that began this morning and is intermittently worse with possible LOF. Wet prep positive for clue cells. Renal ultrasound positive for stone.  Left nephrolithiasis -  Percocet for pain. Patient has allergy which causes nausea and vomiting. Will send home with benadryl  Flomax   Encouraged to increase fluid intake to help pass the stone  Strainer provided. Patient educated to use for every void until the stones are passed.   Reevaluate at  Follow up OB appointment next week Bacterial vaginosis- Denies vaginal discharge and pruritis but will treat d/t abdominal pain and positive wet prep during pregnancy d/t increased risk of preterm labor. Patient is allergic to flagyl (causes facial flushing). Will start Clindamycin gel qhs.  Nausea- Patient received Zofran 4mg  in MAU. Continue PRN benadryl after discharge for nausea and vomiting Microcytic hypochromic anemia- continue prenatal vitamin qd. Leakage of fluid- most likely urinary incontinence. ROM ruled out with negative pooling and Fern. Patient counseled on signs of preterm labor and indications for return.  Melanie Mermiges, Student-PA  04/11/2018, 10:09 AM  I personally was present during the history, physical exam and medical decision-making activities of this service and have verified that the service and findings are accurately documented in the student's note.   PROVIDER MDM  [place] [facility]  Result Date: 04/11/2018 CLINICAL DATA:  Pregnant, left abdominal pain, hematuria, history of nephrolithiasis EXAM: RENAL / URINARY TRACT ULTRASOUND COMPLETE COMPARISON:  None. FINDINGS: Right Kidney: Renal measurements: 11.5 x 4.8 x 4.5 cm = volume: 128.6 mL. Normal parenchymal  echogenicity. Mild fullness of the right renal pelvis, likely physiologic. Left Kidney: Renal measurements: 12.2 x 6.0 x 4.4 cm = volume: 167.0 mL. Normal parenchymal echogenicity. Mild left hydronephrosis. 10 mm proximal left ureteral calculus at the UPJ. Bladder: Within normal limits.  A left bladder jet was visualized. IMPRESSION: Mild left hydronephrosis with associated 10 mm proximal left ureteral calculus at the UPJ. Mild fullness of the right renal pelvis, likely physiologic. Electronically Signed   By: Charline BillsSriyesh  Krishnan M.D.   On: 04/11/2018 12:31   Meds ordered this encounter  Medications  . ondansetron (ZOFRAN-ODT) disintegrating tablet 4 mg  . oxyCODONE-acetaminophen (PERCOCET/ROXICET) 5-325 MG tablet    Sig: Take 1-2 tablets by mouth every 4 (four) hours as needed for up to 7 days for severe pain.    Dispense:  6 tablet    Refill:  0    Order Specific Question:   Supervising Provider    Answer:   Reva BoresPRATT, TANYA S [[id number]]  . diphenhydrAMINE (BENADRYL) 25 MG tablet    Sig: Take 1 tablet (25 mg total) by mouth every 6 (six) hours as needed.    Dispense:  30 tablet    Refill:  0    Order Specific Question:   Supervising Provider    Answer:   Reva BoresPRATT, TANYA S [[id number]]  . tamsulosin (FLOMAX) 0.4 MG CAPS capsule    Sig: Take 1 capsule (0.4 mg total) by mouth daily.    Dispense:  30 capsule    Refill:  2    Order Specific Question:   Supervising Provider    Answer:   Reva BoresPRATT, TANYA S [[id number]]  . clindamycin (CLINDAGEL) 1 % gel    Sig: Apply topically 2 (two) times daily.    Dispense:  30 g    Refill:  0    Bacterial Vaginosis, Flagyl allergy    Order Specific Question:   Supervising Provider    Answer:   Reva BoresPRATT, TANYA S [[id number]]    PROVIDER A/P --Reactive tracing: Baseline 140, moderate variability, positive accels, no decels --Toco Quiet --Left renal calculus, non-obstructing. Referral to Gen Surgery not indicated at this time --Bacterial Vaginosis, Flagyl allergy --Discharge home in  stable condition, patient to strain all urine and increase PO hydration with water  F/U: Next OB 04/27/2018   Clayton BiblesSamantha Rosser [place], CNM 04/11/18  1:18 PM

## 2018-04-11 NOTE — Discharge Instructions (Signed)

## 2018-04-11 NOTE — MAU Note (Signed)
Pain started at 0700, left side/ ? Flank. Constant, with waves of increase.  Thought it was her kidney, has had a stone before, doesn't feel like that.  Reports "pants are soaked", leaked out on the way her, ? Urine, was having a sharp pain at the time.

## 2018-04-12 ENCOUNTER — Encounter: Payer: Self-pay | Admitting: Certified Nurse Midwife

## 2018-04-12 LAB — GC/CHLAMYDIA PROBE AMP (~~LOC~~) NOT AT ARMC
CHLAMYDIA, DNA PROBE: NEGATIVE
NEISSERIA GONORRHEA: NEGATIVE

## 2018-04-13 ENCOUNTER — Telehealth: Payer: Self-pay | Admitting: Advanced Practice Midwife

## 2018-04-13 ENCOUNTER — Telehealth: Payer: Self-pay | Admitting: Obstetrics and Gynecology

## 2018-04-13 ENCOUNTER — Telehealth: Payer: Self-pay

## 2018-04-13 MED ORDER — OXYCODONE-ACETAMINOPHEN 5-325 MG PO TABS: 1.0000 | ORAL_TABLET | ORAL | 0 refills | Status: DC | PRN

## 2018-04-13 NOTE — Telephone Encounter (Signed)
Pt called stating her pain from her kidney stone had not improved and the medication from MAU wasn't helping. Venia Carbon, NP said to let pt know she would send her Rx for Percocet. PT is aware and will go to MAU if pain doesn't improve.

## 2018-04-13 NOTE — Telephone Encounter (Signed)
Gabrielle Roy called the office today stating that her pain was no better and that her pain medication she has from MAU if not helping. She was recently seen in MAU and diagnosed with a  Kidney stone. She has been taking pain medication but says it is not working. RN called the patient back and encouraged patient to continue straining her urine and to continue pain medication and Flomax. If pain persists she may need to go back to MAU and possible admission for pain management.    Venia Carbon I, NP 04/13/2018 1:25 PM

## 2018-04-13 NOTE — Telephone Encounter (Signed)
Discussed plan of care with Dr Vergie Living via Staff Message. Specify Flomax as directed for first week then PRN. Generic voicemail without confirmation of name. Left voicemail with my name and credentials requesting call back. Initiated discussion via active MyChart account.  Clayton Bibles, CNM 04/13/18  10:43 AM

## 2018-04-16 ENCOUNTER — Inpatient Hospital Stay (HOSPITAL_COMMUNITY)
Admission: AD | Admit: 2018-04-16 | Discharge: 2018-04-16 | Disposition: A | Discharge status: Home / Self Care | Attending: Obstetrics and Gynecology | Admitting: Obstetrics and Gynecology

## 2018-04-16 ENCOUNTER — Encounter (HOSPITAL_COMMUNITY): Payer: Self-pay | Admitting: *Deleted

## 2018-04-16 ENCOUNTER — Inpatient Hospital Stay (HOSPITAL_COMMUNITY)

## 2018-04-16 DIAGNOSIS — O34219 Maternal care for unspecified type scar from previous cesarean delivery: Secondary | ICD-10-CM

## 2018-04-16 DIAGNOSIS — Z87891 Personal history of nicotine dependence: Secondary | ICD-10-CM | POA: Insufficient documentation

## 2018-04-16 DIAGNOSIS — Z3A26 26 weeks gestation of pregnancy: Secondary | ICD-10-CM

## 2018-04-16 DIAGNOSIS — O9989 Other specified diseases and conditions complicating pregnancy, childbirth and the puerperium: Secondary | ICD-10-CM

## 2018-04-16 DIAGNOSIS — O26892 Other specified pregnancy related conditions, second trimester: Secondary | ICD-10-CM | POA: Diagnosis not present

## 2018-04-16 DIAGNOSIS — O4400 Placenta previa specified as without hemorrhage, unspecified trimester: Secondary | ICD-10-CM

## 2018-04-16 DIAGNOSIS — Z0371 Encounter for suspected problem with amniotic cavity and membrane ruled out: Secondary | ICD-10-CM

## 2018-04-16 DIAGNOSIS — M549 Dorsalgia, unspecified: Secondary | ICD-10-CM

## 2018-04-16 DIAGNOSIS — S334XXA Traumatic rupture of symphysis pubis, initial encounter: Secondary | ICD-10-CM

## 2018-04-16 DIAGNOSIS — N200 Calculus of kidney: Secondary | ICD-10-CM | POA: Diagnosis not present

## 2018-04-16 DIAGNOSIS — R1032 Left lower quadrant pain: Secondary | ICD-10-CM | POA: Diagnosis present

## 2018-04-16 DIAGNOSIS — Z8759 Personal history of other complications of pregnancy, childbirth and the puerperium: Secondary | ICD-10-CM

## 2018-04-16 DIAGNOSIS — R109 Unspecified abdominal pain: Secondary | ICD-10-CM

## 2018-04-16 DIAGNOSIS — O99891 Other specified diseases and conditions complicating pregnancy: Secondary | ICD-10-CM

## 2018-04-16 LAB — URINALYSIS, ROUTINE W REFLEX MICROSCOPIC
Bilirubin Urine: NEGATIVE
Glucose, UA: NEGATIVE mg/dL
Ketones, ur: NEGATIVE mg/dL
Leukocytes, UA: NEGATIVE
Nitrite: NEGATIVE
Protein, ur: NEGATIVE mg/dL
SPECIFIC GRAVITY, URINE: 1.02 (ref 1.005–1.030)
pH: 5.5 (ref 5.0–8.0)

## 2018-04-16 LAB — CBC
HCT: 32.2 % — ABNORMAL LOW (ref 36.0–46.0)
Hemoglobin: 10.7 g/dL — ABNORMAL LOW (ref 12.0–15.0)
MCH: 33.8 pg (ref 26.0–34.0)
MCHC: 33.2 g/dL (ref 30.0–36.0)
MCV: 101.6 fL — ABNORMAL HIGH (ref 80.0–100.0)
Platelets: 220 10*3/uL (ref 150–400)
RBC: 3.17 MIL/uL — ABNORMAL LOW (ref 3.87–5.11)
RDW: 13.1 % (ref 11.5–15.5)
WBC: 8.8 10*3/uL (ref 4.0–10.5)
nRBC: 2.7 % — ABNORMAL HIGH (ref 0.0–0.2)

## 2018-04-16 LAB — URINALYSIS, MICROSCOPIC (REFLEX): RBC / HPF: >50 RBC/hpf (ref 0–5)

## 2018-04-16 MED ORDER — HYDROMORPHONE HCL 1 MG/ML IJ SOLN
0.5000 mg | INTRAMUSCULAR | Status: DC | PRN
  Administered 2018-04-16: 0.5 mg via INTRAVENOUS
  Filled 2018-04-16: qty 1

## 2018-04-16 MED ORDER — HYDROMORPHONE HCL 1 MG/ML IJ SOLN
1.0000 mg | INTRAMUSCULAR | Status: DC | PRN
  Administered 2018-04-16: 1 mg via INTRAVENOUS
  Filled 2018-04-16: qty 1

## 2018-04-16 MED ORDER — LACTATED RINGERS IV BOLUS
1000.0000 mL | Freq: Once | INTRAVENOUS | Status: AC
  Administered 2018-04-16: 1000 mL via INTRAVENOUS

## 2018-04-16 MED ORDER — HYDROMORPHONE HCL 2 MG PO TABS: 2.0000 mg | ORAL_TABLET | Freq: Four times a day (QID) | ORAL | 0 refills | Status: DC | PRN

## 2018-04-16 NOTE — Discharge Instructions (Signed)

## 2018-04-16 NOTE — MAU Note (Signed)
Pt states she was here last week with a kidney stone but the pain has gotten worse.  She has lower left and right side pain. She states she has been urinating a lot and cannot control it.  She has had diarrhea multiple times and has vomited once she states due to pain.  She said she thinks she has an infection.  Has a history of gastric sleeve surgery so she has diarrhea but not this often.  She said she had a reaction to percocet and was called in something else but hasn't taken it because she/s afraid she will have a reaction.  She took 1000mg  of tylenol at 0900 today.

## 2018-04-16 NOTE — MAU Note (Signed)
Urine in lab 

## 2018-04-16 NOTE — MAU Provider Note (Signed)
History     CSN: [id number]  Arrival date and time: 04/16/18 1013  G3P1102 @26 .1 wks with known kidney stone presenting with worsening pain. Reports worsening pain in her LLQ, left flank, and left lower back over the last few days. Was given Percocet but had emesis just after taking it. Tylenol isn't helping. Denies fever but reports feeling flushed and cold. Reports good FM. No VB, LOF, or ctx.   OB History    Gravida  3   Para  2   Term  1   Preterm  1   AB      Living  2     SAB      TAB      Ectopic      Multiple  0   Live Births  2           Past Medical History:  Diagnosis Date  . Common migraine with intractable migraine 03/10/2018  . Kidney stone    Stent placed in Feb 2017  . PONV (postoperative nausea and vomiting)    nausea during last c section  . Supervision of normal pregnancy, antepartum 10/04/2015    Clinic  [facility] - transfer from Dr. Shawnie Ponsorn Prenatal Labs Dating  Blood type:    Genetic Screen 1 Screen:    AFP:     Quad:     NIPS: Antibody:  Anatomic US  Nml fetal anatomy @ 18 wks; low lying placenta > resolved @23  wks Rubella:   GTT Early:               Third trimester:  RPR:    Flu vaccine  Declined HBsAg:    TDaP vaccine                                               Rhogam: HIV:    Baby Food  Breastfeed                                            GBS: (For PCN allergy, check sensitivities) Contraception  Pap: Circumcision  n/a female ([facility] ultrasound)  Pediatrician   Support Person  [name] (husband)      Past Surgical History:  Procedure Laterality Date  . CESAREAN SECTION    . CESAREAN SECTION N/A 02/20/2016   Procedure: REPEAT CESAREAN SECTION;  Surgeon: Hermina StaggersMichael L Ervin, MD;  [facility];  Service: Obstetrics;  Laterality: N/A;  . FINGER SURGERY    . GASTRIC BYPASS    . kidney stint      Family History  Problem Relation Age of Onset  . Hypertension Father   . Heart disease Father   . Hearing loss Father      Social History   Tobacco Use  . Smoking status: Former Games developermoker  . Smokeless tobacco: Never Used  Substance Use Topics  . Alcohol use: No  . Drug use: No    Allergies:  Allergies  Allergen Reactions  . Alcohol Hives    Allergic to alcohol containing products including cough medicines that contain alcohol  . Flagyl [Metronidazole] Other (See Comments)    Facial flushing  . Hydrocodone-Acetaminophen Nausea And Vomiting    Medications Prior to Admission  Medication Sig Dispense Refill Last  Dose  . amitriptyline (ELAVIL) 10 MG tablet Take one tablet at night for one week, then take 2 tablets at night for one week, then take 3 tablets at night. 90 tablet 3 Taking  . Butalbital-APAP-Caffeine 50-325-40 MG capsule Take 1-2 capsules by mouth every 6 (six) hours as needed for headache. 30 capsule 1 Taking  . clindamycin (CLINDAGEL) 1 % gel Apply topically 2 (two) times daily. 30 g 0   . cyclobenzaprine (FLEXERIL) 10 MG tablet Take 1 tablet (10 mg total) by mouth every 8 (eight) hours as needed for muscle spasms. 30 tablet 1   . diphenhydrAMINE (BENADRYL) 25 MG tablet Take 1 tablet (25 mg total) by mouth every 6 (six) hours as needed. 30 tablet 0   . oxyCODONE-acetaminophen (PERCOCET/ROXICET) 5-325 MG tablet Take 1-2 tablets by mouth every 4 (four) hours as needed for up to 7 days for severe pain. 10 tablet 0   . Prenatal Vit-Fe Fumarate-FA (PRENATAL VITAMIN PO) Take by mouth.   Taking  . promethazine (PHENERGAN) 25 MG tablet Take 1 tablet (25 mg total) by mouth every 6 (six) hours as needed for nausea or vomiting. (Patient not taking: Reported on 03/18/2018) 30 tablet 2 Not Taking  . tamsulosin (FLOMAX) 0.4 MG CAPS capsule Take 1 capsule (0.4 mg total) by mouth daily. 30 capsule 2     Review of Systems  Constitutional: Negative for chills and fever.  Gastrointestinal: Positive for abdominal pain. Negative for nausea and vomiting.  Genitourinary: Positive for flank pain. Negative for  dysuria, urgency and vaginal bleeding.  Musculoskeletal: Positive for back pain.   Physical Exam   Blood pressure (!) 112/55, pulse 75, temperature 97.9 F (36.6 C), temperature source Oral, resp. rate 16, weight 87.1 kg, last menstrual period 10/21/2017, SpO2 100 %.  Physical Exam  Nursing note and vitals reviewed. Constitutional: She is oriented to person, place, and time. She appears well-developed and well-nourished. No distress.  HENT:  Head: Normocephalic and atraumatic.  Neck: Normal range of motion.  Cardiovascular: Normal rate.  Respiratory: Effort normal. No respiratory distress.  GI: Soft. She exhibits no distension. There is no abdominal tenderness. There is no CVA tenderness.  gravid  Musculoskeletal: Normal range of motion.     Cervical back: Normal.     Thoracic back: Normal.     Lumbar back: Normal.  Neurological: She is alert and oriented to person, place, and time.  Skin: Skin is warm and dry.  Psychiatric: She has a normal mood and affect.  EFM: 140 bpm, mod variability, + accels, no decels Toco: none  Results for orders placed or performed during the hospital encounter of 04/16/18 (from the past 24 hour(s))  Urinalysis, Routine w reflex microscopic     Status: Abnormal   Collection Time: 04/16/18 10:49 AM  Result Value Ref Range   Color, Urine AMBER (A) YELLOW   APPearance CLOUDY (A) CLEAR   Specific Gravity, Urine 1.020 1.005 - 1.030   pH 5.5 5.0 - 8.0   Glucose, UA NEGATIVE NEGATIVE mg/dL   Hgb urine dipstick LARGE (A) NEGATIVE   Bilirubin Urine NEGATIVE NEGATIVE   Ketones, ur NEGATIVE NEGATIVE mg/dL   Protein, ur NEGATIVE NEGATIVE mg/dL   Nitrite NEGATIVE NEGATIVE   Leukocytes, UA NEGATIVE NEGATIVE  Urinalysis, Microscopic (reflex)     Status: Abnormal   Collection Time: 04/16/18 10:49 AM  Result Value Ref Range   RBC / HPF >50 0 - 5 RBC/hpf   WBC, UA 0-5 0 - 5 WBC/hpf  Bacteria, UA RARE (A) NONE SEEN   Squamous Epithelial / LPF 0-5 0 - 5    Mucus PRESENT   CBC     Status: Abnormal   Collection Time: 04/16/18 12:37 PM  Result Value Ref Range   WBC 8.8 4.0 - 10.5 K/uL   RBC 3.17 (L) 3.87 - 5.11 MIL/uL   Hemoglobin 10.7 (L) 12.0 - 15.0 g/dL   HCT 62.9 (L) 52.8 - 41.3 %   MCV 101.6 (H) 80.0 - 100.0 fL   MCH 33.8 26.0 - 34.0 pg   MCHC 33.2 30.0 - 36.0 g/dL   RDW 24.4 01.0 - 27.2 %   Platelets 220 150 - 400 K/uL   nRBC 2.7 (H) 0.0 - 0.2 %   US Renal  Result Date: 04/16/2018 CLINICAL DATA:  LEFT flank pain. Nephrolithiasis. LEFT UVJ stone identified on previous ultrasound. EXAM: RENAL / URINARY TRACT ULTRASOUND COMPLETE COMPARISON:  04/11/2018 FINDINGS: Right Kidney: Renal measurements: 12.0 x 4.2 x 5.5 centimeter = volume: 145 mL. There is mild RIGHT-sided hydronephrosis. Visualized proximal ureter is unremarkable. Left Kidney: Renal measurements: 11.8 x 6.3 x 5.0 centimeters = volume: 191.6 ML. Stable mild hydronephrosis. The visualized proximal ureter is unremarkable. A ureteral stone is identified at the level of the iliac artery. Stone measures 1.0 x 0.7 centimeters. Bladder: Appears normal for degree of bladder distention. IMPRESSION: 1. Persistent bilateral hydronephrosis, stable in appearance. 2. The proximal LEFT ureteral stone has migrated distally to the level of the crossing iliac artery. Electronically Signed   By: Norva Pavlov M.D.   On: 04/16/2018 14:06   MAU Course  Procedures Orders Placed This Encounter  Procedures  . US RENAL    Standing Status:   Standing    Number of Occurrences:   1    Order Specific Question:   Symptom/Reason for Exam    Answer:   Nephrolithiasis [[id number]]    Order Specific Question:   Symptom/Reason for Exam    Answer:   Left flank pain [[id number]]  . Urinalysis, Routine w reflex microscopic    Standing Status:   Standing    Number of Occurrences:   1  . Urinalysis, Microscopic (reflex)    Standing Status:   Standing    Number of Occurrences:   1  . CBC    Standing Status:   Standing     Number of Occurrences:   1  . Ambulatory referral to Urology    Referral Priority:   Urgent    Referral Type:   Consultation    Referral Reason:   Specialty Services Required    Requested Specialty:   Urology    Number of Visits Requested:   1  . Discharge patient    Order Specific Question:   Discharge disposition    Answer:   01-Home or Self Care [1]    Order Specific Question:   Discharge patient date    Answer:   04/16/2018   Meds ordered this encounter  Medications  . lactated ringers bolus 1,000 mL  . DISCONTD: HYDROmorphone (DILAUDID) injection 1 mg  . HYDROmorphone (DILAUDID) injection 0.5 mg  . HYDROmorphone (DILAUDID) 2 MG tablet    Sig: Take 1 tablet (2 mg total) by mouth every 6 (six) hours as needed for severe pain.    Dispense:  10 tablet    Refill:  0    Order Specific Question:   Supervising Provider    Answer:   Conan Bowens [[id number]]   MDM Labs  and renal US ordered and reviewed. Pain improved. Stone now in ureter, no worsening hydro. Cannot tolerate Percocet, will switch to Dilaudid po. Continue Flomax and strain urine. Follow up with Urology. Stable for discharge home.   Assessment and Plan  [redacted] weeks gestation Nephrolithiasis Discharge home Follow up with Urology in 1 week- referral placed Follow up at [facility] as scheduled Rx Dilaudid  Allergies as of 04/16/2018      Reactions   Alcohol Hives   Allergic to alcohol containing products including cough medicines that contain alcohol   Flagyl [metronidazole] Other (See Comments)   Facial flushing   Hydrocodone-acetaminophen Nausea And Vomiting      Medication List    STOP taking these medications   oxyCODONE-acetaminophen 5-325 MG tablet Commonly known as:  PERCOCET/ROXICET     TAKE these medications   amitriptyline 10 MG tablet Commonly known as:  ELAVIL Take one tablet at night for one week, then take 2 tablets at night for one week, then take 3 tablets at night.   Butalbital-APAP-Caffeine  50-325-40 MG capsule Take 1-2 capsules by mouth every 6 (six) hours as needed for headache.   clindamycin 1 % gel Commonly known as:  CLINDAGEL Apply topically 2 (two) times daily.   cyclobenzaprine 10 MG tablet Commonly known as:  FLEXERIL Take 1 tablet (10 mg total) by mouth every 8 (eight) hours as needed for muscle spasms.   diphenhydrAMINE 25 MG tablet Commonly known as:  BENADRYL Take 1 tablet (25 mg total) by mouth every 6 (six) hours as needed.   HYDROmorphone 2 MG tablet Commonly known as:  DILAUDID Take 1 tablet (2 mg total) by mouth every 6 (six) hours as needed for severe pain.   PRENATAL VITAMIN PO Take by mouth.   promethazine 25 MG tablet Commonly known as:  PHENERGAN Take 1 tablet (25 mg total) by mouth every 6 (six) hours as needed for nausea or vomiting.   tamsulosin 0.4 MG Caps capsule Commonly known as:  FLOMAX Take 1 capsule (0.4 mg total) by mouth daily.      Donette Larry, CNM 04/16/2018, 2:43 PM

## 2018-04-25 ENCOUNTER — Ambulatory Visit: Admitting: Diagnostic Neuroimaging

## 2018-04-25 ENCOUNTER — Other Ambulatory Visit: Payer: Self-pay

## 2018-04-25 ENCOUNTER — Ambulatory Visit (INDEPENDENT_AMBULATORY_CARE_PROVIDER_SITE_OTHER): Admitting: Obstetrics & Gynecology

## 2018-04-25 ENCOUNTER — Encounter (HOSPITAL_COMMUNITY): Payer: Self-pay | Admitting: *Deleted

## 2018-04-25 ENCOUNTER — Telehealth: Payer: Self-pay | Admitting: *Deleted

## 2018-04-25 ENCOUNTER — Inpatient Hospital Stay (HOSPITAL_COMMUNITY)

## 2018-04-25 ENCOUNTER — Inpatient Hospital Stay (HOSPITAL_COMMUNITY)
Admission: AD | Admit: 2018-04-25 | Discharge: 2018-04-25 | Disposition: A | Discharge status: Home / Self Care | Source: Ambulatory Visit | Attending: Obstetrics and Gynecology | Admitting: Obstetrics and Gynecology

## 2018-04-25 VITALS — BP 111/73 | HR 81 | Temp 97.4°F | Wt 190.0 lb

## 2018-04-25 DIAGNOSIS — R350 Frequency of micturition: Secondary | ICD-10-CM

## 2018-04-25 DIAGNOSIS — O4400 Placenta previa specified as without hemorrhage, unspecified trimester: Secondary | ICD-10-CM

## 2018-04-25 DIAGNOSIS — Z3A27 27 weeks gestation of pregnancy: Secondary | ICD-10-CM | POA: Diagnosis not present

## 2018-04-25 DIAGNOSIS — R3915 Urgency of urination: Secondary | ICD-10-CM | POA: Insufficient documentation

## 2018-04-25 DIAGNOSIS — Z87891 Personal history of nicotine dependence: Secondary | ICD-10-CM | POA: Diagnosis not present

## 2018-04-25 DIAGNOSIS — R102 Pelvic and perineal pain: Secondary | ICD-10-CM | POA: Insufficient documentation

## 2018-04-25 DIAGNOSIS — Z87442 Personal history of urinary calculi: Secondary | ICD-10-CM | POA: Diagnosis not present

## 2018-04-25 DIAGNOSIS — O9989 Other specified diseases and conditions complicating pregnancy, childbirth and the puerperium: Secondary | ICD-10-CM | POA: Diagnosis not present

## 2018-04-25 DIAGNOSIS — O26892 Other specified pregnancy related conditions, second trimester: Secondary | ICD-10-CM | POA: Diagnosis not present

## 2018-04-25 DIAGNOSIS — O368190 Decreased fetal movements, unspecified trimester, not applicable or unspecified: Secondary | ICD-10-CM

## 2018-04-25 DIAGNOSIS — O368120 Decreased fetal movements, second trimester, not applicable or unspecified: Secondary | ICD-10-CM

## 2018-04-25 DIAGNOSIS — R32 Unspecified urinary incontinence: Secondary | ICD-10-CM | POA: Diagnosis not present

## 2018-04-25 DIAGNOSIS — S334XXA Traumatic rupture of symphysis pubis, initial encounter: Secondary | ICD-10-CM

## 2018-04-25 DIAGNOSIS — Z8759 Personal history of other complications of pregnancy, childbirth and the puerperium: Secondary | ICD-10-CM

## 2018-04-25 DIAGNOSIS — N200 Calculus of kidney: Secondary | ICD-10-CM

## 2018-04-25 DIAGNOSIS — O99891 Other specified diseases and conditions complicating pregnancy: Secondary | ICD-10-CM

## 2018-04-25 DIAGNOSIS — Z0371 Encounter for suspected problem with amniotic cavity and membrane ruled out: Secondary | ICD-10-CM

## 2018-04-25 DIAGNOSIS — M549 Dorsalgia, unspecified: Secondary | ICD-10-CM

## 2018-04-25 DIAGNOSIS — O34219 Maternal care for unspecified type scar from previous cesarean delivery: Secondary | ICD-10-CM

## 2018-04-25 LAB — URINALYSIS, MICROSCOPIC (REFLEX)

## 2018-04-25 LAB — POCT URINALYSIS DIPSTICK
Bilirubin, UA: NEGATIVE
Glucose, UA: NEGATIVE
Ketones, UA: NEGATIVE
Leukocytes, UA: NEGATIVE
Nitrite, UA: NEGATIVE
Protein, UA: NEGATIVE
SPEC GRAV UA: 1.01 (ref 1.010–1.025)
Urobilinogen, UA: 0.2 E.U./dL
pH, UA: 5 (ref 5.0–8.0)

## 2018-04-25 LAB — URINALYSIS, ROUTINE W REFLEX MICROSCOPIC
Bilirubin Urine: NEGATIVE
Glucose, UA: >=500 mg/dL — AB
Ketones, ur: NEGATIVE mg/dL
Leukocytes,Ua: NEGATIVE
Nitrite: NEGATIVE
Protein, ur: NEGATIVE mg/dL
pH: 6 (ref 5.0–8.0)

## 2018-04-25 MED ORDER — PHENAZOPYRIDINE HCL 200 MG PO TABS: 200.0000 mg | ORAL_TABLET | Freq: Three times a day (TID) | ORAL | 0 refills | Status: AC | PRN

## 2018-04-25 MED ORDER — PHENAZOPYRIDINE HCL 100 MG PO TABS
200.0000 mg | ORAL_TABLET | Freq: Three times a day (TID) | ORAL | Status: AC
  Administered 2018-04-25: 200 mg via ORAL
  Filled 2018-04-25: qty 2

## 2018-04-25 MED ORDER — NITROFURANTOIN MONOHYD MACRO 100 MG PO CAPS
100.0000 mg | ORAL_CAPSULE | Freq: Once | ORAL | Status: AC
  Administered 2018-04-25: 100 mg via ORAL
  Filled 2018-04-25: qty 1

## 2018-04-25 MED ORDER — NITROFURANTOIN MONOHYD MACRO 100 MG PO CAPS: 100.0000 mg | ORAL_CAPSULE | Freq: Two times a day (BID) | ORAL | 0 refills | Status: AC

## 2018-04-25 NOTE — Progress Notes (Signed)
In and Out cath done and 115 cc urine collected

## 2018-04-25 NOTE — MAU Provider Note (Signed)
History     CSN: [id number]  Arrival date and time: 04/25/18 1401   First Provider Initiated Contact with Patient 04/25/18 1512      Chief Complaint  Patient presents with  . Pelvic Pain   HPI Gabrielle Roy 28 y.o. [redacted]w[redacted]d  Comes to MAU today after an office visit at [facility].  She was diagnosed with kidney stones and passed a stone 5 days ago.  She had pain with urinary retention today and went to the office.  She was unable to urinate well - had only dribbles of urine and incontinence. She is having a constant feeling of urgency and pressure in the lower abdomen. She had urinary retention in the office and had a cath with 115cc returned.  Her cervix was closed and thick.  Was sent home and on the way home had to pull off the road due to pain and having blurred vision with the pain (also has blurred vision with migraines).      OB History    Gravida  3   Para  2   Term  1   Preterm  1   AB      Living  2     SAB      TAB      Ectopic      Multiple  0   Live Births  2           Past Medical History:  Diagnosis Date  . Common migraine with intractable migraine 03/10/2018  . Kidney stone    Stent placed in Feb 2017  . PONV (postoperative nausea and vomiting)    nausea during last c section  . Supervision of normal pregnancy, antepartum 10/04/2015    Clinic  [place] - transfer from Dr. Shawnie Pons Prenatal Labs Dating  Blood type:    Genetic Screen 1 Screen:    AFP:     Quad:     NIPS: Antibody:  Anatomic [place]  Nml fetal anatomy @ 18 wks; low lying placenta > resolved @23  wks Rubella:   GTT Early:               Third trimester:  RPR:    Flu vaccine  Declined HBsAg:    TDaP vaccine                                               Rhogam: HIV:    Baby Food  Breastfeed                                            GBS: (For PCN allergy, check sensitivities) Contraception  Pap: Circumcision  n/a female ([facility] ultrasound)  Pediatrician   Support Person  [name]  (husband)      Past Surgical History:  Procedure Laterality Date  . CESAREAN SECTION    . CESAREAN SECTION N/A 02/20/2016   Procedure: REPEAT CESAREAN SECTION;  Surgeon: Hermina Staggers, MD;  Location: [facility];  Service: Obstetrics;  Laterality: N/A;  . FINGER SURGERY    . GASTRIC BYPASS    . kidney stint      Family History  Problem Relation Age of Onset  . Hypertension Father   . Heart disease Father   .  Hearing loss Father     Social History   Tobacco Use  . Smoking status: Former Games developermoker  . Smokeless tobacco: Never Used  Substance Use Topics  . Alcohol use: No  . Drug use: No    Allergies:  Allergies  Allergen Reactions  . Alcohol Hives    Allergic to alcohol containing products including cough medicines that contain alcohol  . Flagyl [Metronidazole] Other (See Comments)    Facial flushing  . Hydrocodone-Acetaminophen Nausea And Vomiting    Medications Prior to Admission  Medication Sig Dispense Refill Last Dose  . Prenatal Vit-Fe Fumarate-FA (PRENATAL VITAMIN PO) Take by mouth.   04/24/2018 at Unknown time  . amitriptyline (ELAVIL) 10 MG tablet Take one tablet at night for one week, then take 2 tablets at night for one week, then take 3 tablets at night. (Patient not taking: Reported on 04/25/2018) 90 tablet 3 Not Taking  . Butalbital-APAP-Caffeine 50-325-40 MG capsule Take 1-2 capsules by mouth every 6 (six) hours as needed for headache. (Patient not taking: Reported on 04/25/2018) 30 capsule 1 Not Taking  . clindamycin (CLINDAGEL) 1 % gel Apply topically 2 (two) times daily. (Patient not taking: Reported on 04/25/2018) 30 g 0 Not Taking  . cyclobenzaprine (FLEXERIL) 10 MG tablet Take 1 tablet (10 mg total) by mouth every 8 (eight) hours as needed for muscle spasms. (Patient not taking: Reported on 04/25/2018) 30 tablet 1 Not Taking  . diphenhydrAMINE (BENADRYL) 25 MG tablet Take 1 tablet (25 mg total) by mouth every 6 (six) hours as needed. (Patient not  taking: Reported on 04/25/2018) 30 tablet 0 Not Taking  . HYDROmorphone (DILAUDID) 2 MG tablet Take 1 tablet (2 mg total) by mouth every 6 (six) hours as needed for severe pain. (Patient not taking: Reported on 04/25/2018) 10 tablet 0 Not Taking  . promethazine (PHENERGAN) 25 MG tablet Take 1 tablet (25 mg total) by mouth every 6 (six) hours as needed for nausea or vomiting. (Patient not taking: Reported on 03/18/2018) 30 tablet 2 Not Taking  . tamsulosin (FLOMAX) 0.4 MG CAPS capsule Take 1 capsule (0.4 mg total) by mouth daily. (Patient not taking: Reported on 04/25/2018) 30 capsule 2 Not Taking    Review of Systems  Constitutional: Negative for fever.  Gastrointestinal: Negative for abdominal pain.  Genitourinary: Negative for vaginal bleeding and vaginal discharge.       Urinary retention Urinary urgency Unable to fully void - dribbles Is not having contractions  Musculoskeletal: Positive for back pain.   Physical Exam   Blood pressure 126/68, pulse 86, temperature 98.1 F (36.7 C), temperature source Oral, resp. rate 18, last menstrual period 10/21/2017, SpO2 100 %.  Physical Exam  Nursing note and vitals reviewed. Constitutional: She is oriented to person, place, and time. She appears well-developed and well-nourished.  HENT:  Head: Normocephalic.  Eyes: EOM are normal.  Neck: Neck supple.  GI: Soft. There is no abdominal tenderness. There is no rebound and no guarding.  Was monitored today in the office.  Here in MAU was hard to trace and baseline is 140 with moderate variability. Will take off the monitor for now.  Musculoskeletal: Normal range of motion.  Neurological: She is alert and oriented to person, place, and time.  Skin: Skin is warm and dry.  Psychiatric: She has a normal mood and affect.    MAU Course  Procedures Results for orders placed or performed during the hospital encounter of 04/25/18 (from the past 24 hour(s))  Urinalysis, Routine  w reflex microscopic      Status: Abnormal   Collection Time: 04/25/18  3:10 PM  Result Value Ref Range   Color, Urine YELLOW YELLOW   APPearance CLEAR CLEAR   Specific Gravity, Urine >1.030 (H) 1.005 - 1.030   pH 6.0 5.0 - 8.0   Glucose, UA >=500 (A) NEGATIVE mg/dL   Hgb urine dipstick LARGE (A) NEGATIVE   Bilirubin Urine NEGATIVE NEGATIVE   Ketones, ur NEGATIVE NEGATIVE mg/dL   Protein, ur NEGATIVE NEGATIVE mg/dL   Nitrite NEGATIVE NEGATIVE   Leukocytes,Ua NEGATIVE NEGATIVE  Urinalysis, Microscopic (reflex)     Status: Abnormal   Collection Time: 04/25/18  3:10 PM  Result Value Ref Range   RBC / HPF 21-50 0 - 5 RBC/hpf   WBC, UA 0-5 0 - 5 WBC/hpf   Bacteria, UA MANY (A) NONE SEEN   Squamous Epithelial / LPF 0-5 0 - 5   Ca Oxalate Crys, UA PRESENT    Renal ultrasound done today and results reviewed.  No stones seen.  MDM Reviewed presentation with Dr. Earlene Plater and got Urology consult with Dr Annabell Howells.  Will get renal Ultrasound. Reviewed renal ultrasound with Dr. Annabell Howells and reviewed the plan of care. OK to restart Flomax and use pyridium and antibiotics.  Will need to push fluids. Will have his office reach out to client in the morning or she can talk to the triage nurse at the office as Dr. Annabell Howells was going to contact them about her coming into the office in [place] rather than having to drive to [place].  Assessment and Plan  Urinary urgency Hx of kidney stones.  Plan Urine culture sent to rule out infection Drink at least 64 ounces of water every day.  flshing with fluids may decrease sense of urgency if a stone is trying to be expelled. Go by your pharmacy and pick up your medications - Macrobid 10 mg PO BID x 7 days and Pyridium 200 mg PO TID PRN #9 no refills.  Advised pyridium will make urine orange. Restart flomax. Call [facility] in the morning if you want to be seen in [facility] for this problem. Expect to be seen on Tuesday,. Tell them you were in Maternity Admissions and we consulted  with Dr. Annabell Howells.  Vertis Scheib L Alea Ryer 04/25/2018, 3:37 PM

## 2018-04-25 NOTE — Discharge Instructions (Signed)
Drink at least 64 ounces of water every day Go by your pharmacy and pick up your medications. Call [facility] in the morning if you want to be seen in [facility] for this problem.  Tell them you were in Maternity Admissions and we consulted with Dr. Annabell Howells.

## 2018-04-25 NOTE — Patient Instructions (Addendum)
PVR = 115  UA has large blood, otherwise negative Vaginal Exam--cervix, long, and closed.

## 2018-04-25 NOTE — MAU Note (Signed)
On the way home from appt.  Broke into cold sweats, and started having severe pain in pelvis.  They cathed her in the office today.lots of pressure.  Hx of kidney stones.

## 2018-04-25 NOTE — MAU Note (Signed)
Urine in lab 

## 2018-04-25 NOTE — Telephone Encounter (Signed)
Patient stated that her pain has increased since leaving the office on 04/25/2018. Patient stated that she had to pull off on the side of the road. Mariel Aloe, RN advised with myself for patient to go [facility] MAU.

## 2018-04-25 NOTE — Progress Notes (Signed)
Pt c/o increased pressure    PRENATAL VISIT NOTE  Subjective:  Gabrielle Roy is a 28 y.o. G3P1102 at [redacted]w[redacted]d being seen today for ongoing prenatal care.  She is currently monitored for the following issues for this high-risk pregnancy and has Supervision of normal pregnancy, antepartum; Anxiety in pregnancy, antepartum; Depressive disorder, not elsewhere classified; History of gestational hypertension; Gastric bypass status for obesity; Previous cesarean section complicating pregnancy, antepartum condition or complication; Placenta previa affecting delivery; Common migraine with intractable migraine; No leakage of amniotic fluid into vagina; Back pain in pregnancy; and Symphysis pubis disruption, initial encounter on their problem list.  Patient reports passing stone yesterday-had bleeding and pain with urination.  No vo. No vaginal bleeding.  Pt feels like not emptying her bladder completely.  She has constant pain suprapubically.  She is also having some urinary incontinence. Contractions: Not present. Vag. Bleeding: None.  Movement: Present. Denies leaking of fluid.   The following portions of the patient's history were reviewed and updated as appropriate: allergies, current medications, past family history, past medical history, past social history, past surgical history and problem list. Problem list updated.  Objective:   Vitals:   04/25/18 1012  BP: 111/73  Pulse: 81  Temp: (!) 97.4 F (36.3 C)  Weight: 190 lb (86.2 kg)    Fetal Status: Fetal Heart Rate (bpm): 143   Movement: Present     General:  Alert, oriented and cooperative. Patient is in no acute distress.  Skin: Skin is warm and dry. No rash noted.   Cardiovascular: Normal heart rate noted  Respiratory: Normal respiratory effort, no problems with respiration noted  Abdomen: Soft, gravid, appropriate for gestational age.  Pain/Pressure: Present     Pelvic: Cervical exam deferred      Pt has LLP.  Speculum exam shows closed  cervix with visual length.  No bleeding nor discharge.    Extremities: Normal range of motion.  Edema: None  Mental Status: Normal mood and affect. Normal behavior. Normal judgment and thought content.   Assessment and Plan:  Pregnancy: [id number] at [redacted]w[redacted]d with   1. Frequent urination, incomplete bladder empying, kidney stones - PVR 115 cc - POCT Urinalysis Dipstick - Pt has appt with [facility]rology tomorrow.  I [facility] and they would be unable to see her for several weeks.  Patient is to keep urology appointment with [facility] tomorrow.  2.  NST has 10 x 10 accelerations.  Moderate variability.  There are no contractions.  Patient feeling good fetal movement at the end of the visit.  3.  28 week labs.  Preterm labor symptoms and general obstetric precautions including but not limited to vaginal bleeding, contractions, leaking of fluid and fetal movement were reviewed in detail with the patient. Please refer to After Visit Summary for other counseling recommendations.  No follow-ups on file.  Future Appointments  Date Time Provider Department Center  04/27/2018  9:00 AM Rasch, Harolyn Rutherford, NP [facility]  05/13/2018  9:00 AM [facility] MD [facility]  05/13/2018  9:30 AM [facility] [place] 5 [facility]  06/09/2018 12:45 PM Glean Salvo, NP [facility] None    Elsie Lincoln, MD

## 2018-04-25 NOTE — MAU Note (Signed)
Urine in the lab  

## 2018-04-26 ENCOUNTER — Other Ambulatory Visit: Payer: Self-pay | Admitting: Urology

## 2018-04-26 ENCOUNTER — Ambulatory Visit (HOSPITAL_COMMUNITY)
Admission: RE | Admit: 2018-04-26 | Discharge: 2018-04-26 | Disposition: A | Discharge status: Home / Self Care | Attending: Urology | Admitting: Urology

## 2018-04-26 ENCOUNTER — Ambulatory Visit (HOSPITAL_COMMUNITY)

## 2018-04-26 ENCOUNTER — Ambulatory Visit (HOSPITAL_COMMUNITY): Admitting: Anesthesiology

## 2018-04-26 ENCOUNTER — Encounter (HOSPITAL_COMMUNITY)
Admission: RE | Disposition: A | Discharge status: Home / Self Care | Payer: Self-pay | Source: Home / Self Care | Attending: Urology

## 2018-04-26 ENCOUNTER — Encounter (HOSPITAL_COMMUNITY): Payer: Self-pay | Admitting: General Practice

## 2018-04-26 ENCOUNTER — Encounter: Payer: Self-pay | Admitting: Obstetrics and Gynecology

## 2018-04-26 DIAGNOSIS — N132 Hydronephrosis with renal and ureteral calculous obstruction: Secondary | ICD-10-CM | POA: Diagnosis not present

## 2018-04-26 DIAGNOSIS — Z885 Allergy status to narcotic agent status: Secondary | ICD-10-CM | POA: Diagnosis not present

## 2018-04-26 DIAGNOSIS — Z87891 Personal history of nicotine dependence: Secondary | ICD-10-CM | POA: Insufficient documentation

## 2018-04-26 DIAGNOSIS — N201 Calculus of ureter: Secondary | ICD-10-CM | POA: Diagnosis present

## 2018-04-26 DIAGNOSIS — Z79899 Other long term (current) drug therapy: Secondary | ICD-10-CM | POA: Diagnosis not present

## 2018-04-26 DIAGNOSIS — Z881 Allergy status to other antibiotic agents status: Secondary | ICD-10-CM | POA: Diagnosis not present

## 2018-04-26 DIAGNOSIS — Z3A27 27 weeks gestation of pregnancy: Secondary | ICD-10-CM | POA: Diagnosis not present

## 2018-04-26 DIAGNOSIS — Z980 Intestinal bypass and anastomosis status: Secondary | ICD-10-CM | POA: Insufficient documentation

## 2018-04-26 DIAGNOSIS — O26892 Other specified pregnancy related conditions, second trimester: Secondary | ICD-10-CM | POA: Insufficient documentation

## 2018-04-26 DIAGNOSIS — N200 Calculus of kidney: Secondary | ICD-10-CM

## 2018-04-26 HISTORY — PX: CYSTOSCOPY W/ URETERAL STENT PLACEMENT: SHX1429

## 2018-04-26 LAB — CBC
HCT: 32.8 % — ABNORMAL LOW (ref 36.0–46.0)
Hemoglobin: 11 g/dL — ABNORMAL LOW (ref 12.0–15.0)
MCH: 34.3 pg — ABNORMAL HIGH (ref 26.0–34.0)
MCHC: 33.5 g/dL (ref 30.0–36.0)
MCV: 102.2 fL — ABNORMAL HIGH (ref 80.0–100.0)
Platelets: 231 10*3/uL (ref 150–400)
RBC: 3.21 MIL/uL — ABNORMAL LOW (ref 3.87–5.11)
RDW: 12.7 % (ref 11.5–15.5)
WBC: 10.2 10*3/uL (ref 4.0–10.5)
nRBC: 0 % (ref 0.0–0.2)

## 2018-04-26 SURGERY — CYSTOSCOPY, WITH RETROGRADE PYELOGRAM AND URETERAL STENT INSERTION
Anesthesia: Spinal | Site: Ureter | Laterality: Left

## 2018-04-26 MED ORDER — SODIUM CHLORIDE 0.9 % IR SOLN
Status: DC | PRN
  Administered 2018-04-26: 3000 mL

## 2018-04-26 MED ORDER — FENTANYL CITRATE (PF) 100 MCG/2ML IJ SOLN: 25.0000 ug | INTRAMUSCULAR | Status: DC | PRN

## 2018-04-26 MED ORDER — PHENYLEPHRINE HCL 10 MG/ML IJ SOLN
INTRAMUSCULAR | Status: AC
  Filled 2018-04-26: qty 2

## 2018-04-26 MED ORDER — ONDANSETRON HCL 4 MG/2ML IJ SOLN
INTRAMUSCULAR | Status: DC | PRN
  Administered 2018-04-26: 4 mg via INTRAVENOUS

## 2018-04-26 MED ORDER — METOCLOPRAMIDE HCL 5 MG/ML IJ SOLN
INTRAMUSCULAR | Status: AC
  Filled 2018-04-26: qty 2

## 2018-04-26 MED ORDER — DEXAMETHASONE SODIUM PHOSPHATE 10 MG/ML IJ SOLN
INTRAMUSCULAR | Status: AC
  Filled 2018-04-26: qty 1

## 2018-04-26 MED ORDER — CEFAZOLIN SODIUM-DEXTROSE 2-4 GM/100ML-% IV SOLN
2.0000 g | INTRAVENOUS | Status: AC
  Administered 2018-04-26: 2 g via INTRAVENOUS
  Filled 2018-04-26: qty 100

## 2018-04-26 MED ORDER — SODIUM CHLORIDE 0.9 % IV SOLN
INTRAVENOUS | Status: DC | PRN
  Administered 2018-04-26: 25 ug/min via INTRAVENOUS

## 2018-04-26 MED ORDER — DEXAMETHASONE SODIUM PHOSPHATE 10 MG/ML IJ SOLN
INTRAMUSCULAR | Status: DC | PRN
  Administered 2018-04-26: 10 mg via INTRAVENOUS

## 2018-04-26 MED ORDER — SODIUM CHLORIDE 0.9 % IV SOLN
INTRAVENOUS | Status: DC | PRN
  Administered 2018-04-26: 8 mL

## 2018-04-26 MED ORDER — BUPIVACAINE IN DEXTROSE 0.75-8.25 % IT SOLN
INTRATHECAL | Status: DC | PRN
  Administered 2018-04-26: 1 mL via INTRATHECAL

## 2018-04-26 MED ORDER — 0.9 % SODIUM CHLORIDE (POUR BTL) OPTIME
TOPICAL | Status: DC | PRN
  Administered 2018-04-26: 1000 mL

## 2018-04-26 MED ORDER — LACTATED RINGERS IV SOLN
INTRAVENOUS | Status: DC
  Administered 2018-04-26: 17:00:00 via INTRAVENOUS

## 2018-04-26 MED ORDER — METOCLOPRAMIDE HCL 5 MG/ML IJ SOLN
5.0000 mg | Freq: Once | INTRAMUSCULAR | Status: AC
  Administered 2018-04-26: 5 mg via INTRAVENOUS

## 2018-04-26 SURGICAL SUPPLY — 13 items
BAG URO CATCHER STRL LF (MISCELLANEOUS) ×2 IMPLANT
CATH URET 5FR 28IN OPEN ENDED (CATHETERS) ×2 IMPLANT
CLOTH BEACON ORANGE TIMEOUT ST (SAFETY) ×2 IMPLANT
GLOVE BIOGEL M STRL SZ7.5 (GLOVE) ×2 IMPLANT
GLOVE BIOGEL PI IND STRL 7.5 (GLOVE) ×2 IMPLANT
GLOVE BIOGEL PI INDICATOR 7.5 (GLOVE) ×2
GOWN STRL REUS W/TWL LRG LVL3 (GOWN DISPOSABLE) ×2 IMPLANT
GOWN STRL REUS W/TWL XL LVL3 (GOWN DISPOSABLE) ×2 IMPLANT
GUIDEWIRE STR DUAL SENSOR (WIRE) ×2 IMPLANT
MANIFOLD NEPTUNE II (INSTRUMENTS) ×2 IMPLANT
PACK CYSTO (CUSTOM PROCEDURE TRAY) ×2 IMPLANT
STENT POLARIS 5FRX22 (STENTS) ×2 IMPLANT
TUBING CONNECTING 10 (TUBING) ×2 IMPLANT

## 2018-04-26 NOTE — Anesthesia Postprocedure Evaluation (Signed)
Anesthesia Post Note  Patient: Gabrielle Roy  Procedure(s) Performed: CYSTOSCOPY WITH RETROGRADE PYELOGRAM/URETERAL STENT PLACEMENT (Left Ureter)     Patient location during evaluation: PACU Anesthesia Type: Spinal Level of consciousness: awake and alert Pain management: pain level controlled Vital Signs Assessment: post-procedure vital signs reviewed and stable Respiratory status: spontaneous breathing and respiratory function stable Cardiovascular status: blood pressure returned to baseline and stable Postop Assessment: spinal receding Anesthetic complications: no    Last Vitals:  Vitals:   04/26/18 1830 04/26/18 1845  BP: 108/61 118/62  Pulse: 60 61  Resp: 17 18  Temp:    SpO2: 100% 100%    Last Pain:  Vitals:   04/26/18 1845  TempSrc:   PainSc: 0-No pain                 Kennieth Rad

## 2018-04-26 NOTE — Anesthesia Procedure Notes (Signed)
Spinal  Patient location during procedure: OR Start time: 04/26/2018 5:14 PM End time: 04/26/2018 5:19 PM Staffing Anesthesiologist: Marcene Duos, MD Performed: anesthesiologist  Preanesthetic Checklist Completed: patient identified, site marked, surgical consent, pre-op evaluation, timeout performed, IV checked, risks and benefits discussed and monitors and equipment checked Spinal Block Patient position: sitting Prep: DuraPrep Patient monitoring: heart rate, cardiac monitor, continuous pulse ox and blood pressure Approach: midline Location: L4-5 Injection technique: single-shot Needle Needle type: Pencan  Needle gauge: 24 G Needle length: 9 cm Assessment Sensory level: T6

## 2018-04-26 NOTE — Anesthesia Preprocedure Evaluation (Addendum)
Anesthesia Evaluation  Patient identified by MRN, date of birth, ID band Patient awake    Reviewed: Allergy & Precautions, NPO status , Patient's Chart, lab work & pertinent test results  History of Anesthesia Complications (+) PONV  Airway Mallampati: II  TM Distance: >3 FB Neck ROM: Full    Dental   Pulmonary former smoker,    breath sounds clear to auscultation       Cardiovascular negative cardio ROS   Rhythm:Regular Rate:Normal     Neuro/Psych  Headaches,    GI/Hepatic Neg liver ROS, S/p gastric bypass   Endo/Other  negative endocrine ROS  Renal/GU Renal disease     Musculoskeletal   Abdominal   Peds  Hematology negative hematology ROS (+)   Anesthesia Other Findings   Reproductive/Obstetrics (+) Pregnancy                            Lab Results  Component Value Date   WBC 8.8 04/16/2018   HGB 10.7 (L) 04/16/2018   HCT 32.2 (L) 04/16/2018   MCV 101.6 (H) 04/16/2018   PLT 220 04/16/2018   Lab Results  Component Value Date   CREATININE 0.39 (L) 01/29/2018   BUN 12 01/29/2018   NA 138 01/29/2018   K 3.5 01/29/2018   CL 109 01/29/2018   CO2 23 01/29/2018    Anesthesia Physical Anesthesia Plan  ASA: II  Anesthesia Plan: Spinal   Post-op Pain Management:    Induction:   PONV Risk Score and Plan: 3 and Ondansetron, Dexamethasone and Treatment may vary due to age or medical condition  Airway Management Planned: Natural Airway  Additional Equipment:   Intra-op Plan:   Post-operative Plan:   Informed Consent: I have reviewed the patients History and Physical, chart, labs and discussed the procedure including the risks, benefits and alternatives for the proposed anesthesia with the patient or authorized representative who has indicated his/her understanding and acceptance.       Plan Discussed with: CRNA  Anesthesia Plan Comments:        Anesthesia Quick  Evaluation

## 2018-04-26 NOTE — H&P (Signed)
Patient presented to the ER 04/25/2018 for pelvic pain, constant urge to void but small amount of urine. She has been experiencing constant pressure in bladder. She is currently 27 weeks and 4 days pregnant. She was diagnosed with kidney stones and passed a stone days ago. She had pain with urinary retention at hospital and an in and out specimen was performed showing 115 cc returned. She has had only dribbles of urine and incontinence.   The patient has persistent pressure and associated intermittent nausea and vomiting. She denies any severe dysuria, but does have nitrites in her urine. Renal ultrasound performed yesterday demonstrated persistent left hydroureteronephrosis. Several ultrasounds prior to this there was a 1 cm stone in the mid ureter.     ALLERGIES:  Flagyl Hydrocodone-Acetaminophen     Notes: Patient allergic to alcohol containing products including cough medicines that contain alcohol   MEDICATIONS: Amitriptyline Hcl 10 mg tablet  Butalbital Compound-Codeine  Clindamycin Phosphate 1 % gel  Cyclobenzaprine Hcl 10 mg tablet  Diphenhydramine Hcl 25 mg tablet  Flomax 0.4 mg capsule  Prenatal     GU PSH: None   NON-GU PSH: Cesarean Delivery, (2014, 2018) Gastric sleeve, Gastric bypass sleeve (2019)    GU PMH: No GU PMH    NON-GU PMH: No Non-GU PMH    FAMILY HISTORY: 2 daughters - Daughter Heart Disease - Father Hypertension - Father nephrolithiasis - Father   SOCIAL HISTORY: Marital Status: Married Preferred Language: English; Ethnicity: Not Hispanic Or Latino; Race: White Current Smoking Status: Patient does not smoke anymore. Has not smoked since 04/09/2016. Smoked for 3 years.   Tobacco Use Assessment Completed: Used Tobacco in last 30 days? Has never drank.  Drinks 2 caffeinated drinks per day. 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    REVIEW OF SYSTEMS:    GU Review Female:   Patient reports frequent urination, get up at night to urinate, leakage of  urine, stream starts and stops, trouble starting your stream, and have to strain to urinate. Patient denies hard to postpone urination, burning /pain with urination, and being pregnant.  Gastrointestinal (Upper):   Patient denies nausea, vomiting, and indigestion/ heartburn.  Gastrointestinal (Lower):   Patient reports diarrhea. Patient denies constipation.  Constitutional:   Patient denies night sweats, fever, weight loss, and fatigue.  Skin:   Patient denies skin rash/ lesion and itching.  Eyes:   Patient reports blurred vision. Patient denies double vision.  Ears/ Nose/ Throat:   Patient denies sore throat and sinus problems.  Hematologic/Lymphatic:   Patient denies swollen glands and easy bruising.  Cardiovascular:   Patient denies leg swelling and chest pains.  Respiratory:   Patient denies cough and shortness of breath.  Endocrine:   Patient denies excessive thirst.  Musculoskeletal:   Patient denies back pain and joint pain.  Neurological:   Patient denies headaches and dizziness.  Psychologic:   Patient denies depression and anxiety.   Notes: Currently pregnant    VITAL SIGNS:      04/26/2018 09:44 AM  Weight 190 lb / 86.18 kg  BP 106/69 mmHg  Pulse 106 /min  Temperature 98.3 F / 36.8 C   MULTI-SYSTEM PHYSICAL EXAMINATION:    Constitutional: Well-nourished. No physical deformities. Normally developed. Good grooming.  Neck: Neck symmetrical, not swollen. Normal tracheal position.  Respiratory: Normal breath sounds. No labored breathing, no use of accessory muscles.   Cardiovascular: Regular rate and rhythm. No murmur, no gallop. Normal temperature, normal extremity pulses, no swelling, no varicosities.  Lymphatic: No enlargement of neck, axillae, groin.  Skin: No paleness, no jaundice, no cyanosis. No lesion, no ulcer, no rash.  Neurologic / Psychiatric: Oriented to time, oriented to place, oriented to person. No depression, no anxiety, no agitation.  Gastrointestinal: No  mass, no tenderness, no rigidity, non obese abdomen. Twenty-seven week pregnant uterus  Eyes: Normal conjunctivae. Normal eyelids.  Ears, Nose, Mouth, and Throat: Left ear no scars, no lesions, no masses. Right ear no scars, no lesions, no masses. Nose no scars, no lesions, no masses. Normal hearing. Normal lips.  Musculoskeletal: Normal gait and station of head and neck.     PAST DATA REVIEWED:  Source Of History:  Patient  Records Review:   Previous Doctor Records, Previous Patient Records, POC Tool  X-Ray Review: Renal Ultrasound: Reviewed Films. Discussed With Patient.     PROCEDURES:          Urinalysis w/Scope Dipstick Dipstick Cont'd Micro  Color: Orange Bilirubin: Neg mg/dL WBC/hpf: NS (Not Seen)  Appearance: Cloudy Ketones: Neg mg/dL RBC/hpf: 3 - 34/KAJ  Specific Gravity: 1.025 Blood: Neg ery/uL Bacteria: NS (Not Seen)  pH: 6.0 Protein: Neg mg/dL Cystals: Ca Oxalate  Glucose: Neg mg/dL Urobilinogen: 1.0 mg/dL Casts: NS (Not Seen)    Nitrites: Positive Trichomonas: Not Present    Leukocyte Esterase: Neg leu/uL Mucous: Present      Epithelial Cells: NS (Not Seen)      Yeast: NS (Not Seen)      Sperm: Not Present    ASSESSMENT:      ICD-10 Details  1 GU:   Ureteral calculus - N20.1    PLAN:           Document Letter(s):  Created for Patient: Clinical Summary         Notes:   The patient has a 1 cm distal left ureteral stone in his persistently symptomatic. Our plan is to have the patient proceed to the operating room for urgent stent placement I will then have her follow up with me following the delivery of her baby so that we can treat her stone. We will advise the rapid response obstetrician team for consultation regarding the she way that it patient's pregnancy.

## 2018-04-26 NOTE — Transfer of Care (Signed)
Immediate Anesthesia Transfer of Care Note  Patient: Gabrielle Roy  Procedure(s) Performed: CYSTOSCOPY WITH RETROGRADE PYELOGRAM/URETERAL STENT PLACEMENT (Left Ureter)  Patient Location: PACU  Anesthesia Type:Spinal  Level of Consciousness: awake, alert  and oriented  Airway & Oxygen Therapy: Patient Spontanous Breathing  Post-op Assessment: Report given to RN and Post -op Vital signs reviewed and stable  Post vital signs: Reviewed and stable  Last Vitals:  Vitals Value Taken Time  BP    Temp    Pulse 60 04/26/2018  5:57 PM  Resp 11 04/26/2018  5:57 PM  SpO2 86 % 04/26/2018  5:57 PM  Vitals shown include unvalidated device data.  Last Pain:  Vitals:   04/26/18 1447  TempSrc: Oral  PainSc: 4          Complications: No apparent anesthesia complications

## 2018-04-26 NOTE — Progress Notes (Signed)
OB Faculty Note  Asked by Dr. Marlou Porch of Urology for obstetric clearance for ureteral stent procedure.   Briefly, patient is 28 yo [phone] @ [redacted]w[redacted]d with h/o c-section x2, low lying placenta, who has a h/o kidney stones, passed one last week but had significant pain yesterday with concern for another stone. Seen in [facility] and discharged to home; seen at [facility] this am, scheduled for stent procedure today at [facility]. Patient is cleared from obstetric point of view for Urological procedure. Will have Rapid Response RN perform bedside NST pre- and post-op ([phone]). She may have anesthesia as needed, please call if you have any questions about pregnancy safe antibiotics.    For questions, please call the Center for [facility] at [facility] Faculty Practice Monday - Friday, 8 am - 5 pm: [phone] All other times: [phone]   K. Therese Sarah, M.D. Attending Center for [employer] [facility])

## 2018-04-26 NOTE — Anesthesia Procedure Notes (Addendum)
Date/Time: 04/26/2018 5:14 PM Performed by: Nelle Don, CRNA Pre-anesthesia Checklist: Patient being monitored, Suction available, Emergency Drugs available and Patient identified Patient Re-evaluated:Patient Re-evaluated prior to induction Oxygen Delivery Method: Simple face mask

## 2018-04-26 NOTE — Op Note (Signed)
Preoperative diagnosis:  1. Left flank pain, hydronephrosis, hematuria, presumed distal ureteral stone   Postoperative diagnosis:  1. same   Procedure:  1. Cystoscopy 2. left ureteral stent placement, 22 cm x 5 French Polaris ureteral stent 3. left retrograde pyelography with interpretation   Surgeon: Crist Fat, MD  Anesthesia: General  Complications: None  Intraoperative findings: Limited x-ray was performed in the renal pelvis to confirm that the stent was in a good position.  There was mild hydronephrosis.  The stent was noted to curl into the lower pole.  EBL: Minimal  Specimens: None  Indication: Gabrielle Roy is a 28 y.o. patient with severe left renal colic symptoms, hydro-, hematuria, and evidence of a ureteral stone on ultrasound.  She is [redacted] weeks pregnant. After reviewing the management options for treatment, he elected to proceed with the above surgical procedure(s). We have discussed the potential benefits and risks of the procedure, side effects of the proposed treatment, the likelihood of the patient achieving the goals of the procedure, and any potential problems that might occur during the procedure or recuperation. Informed consent has been obtained.  Description of procedure:  The patient was taken to the operating room and general anesthesia was induced.  The patient was placed in the dorsal lithotomy position, prepped and draped in the usual sterile fashion, and preoperative antibiotics were administered. A preoperative time-out was performed.   Cystourethroscopy was performed.  The patient's urethra was examined and was normal. The bladder was then systematically examined in its entirety. There was no evidence for any bladder tumors, stones, or other mucosal pathology.    Attention then turned to the left ureteral orifice and a ureteral catheter was used to intubate the ureteral orifice.  Omnipaque contrast was injected through the ureteral catheter  and a retrograde pyelogram was performed with findings as dictated above.  A 0.38 sensor guidewire was then advanced up the left ureter into the renal pelvis under fluoroscopic guidance.  The wire was then backloaded through the cystoscope and a ureteral stent was advance over the wire using Seldinger technique.  The stent was positioned appropriately under fluoroscopic and cystoscopic guidance.  The wire was then removed with an adequate stent curl noted in the renal pelvis as well as in the bladder.  The bladder was then emptied and the procedure ended.  The patient appeared to tolerate the procedure well and without complications.  The patient was able to be awakened and transferred to the recovery unit in satisfactory condition.    Crist Fat, M.D.

## 2018-04-26 NOTE — Progress Notes (Signed)
1900 Post op NST complete.  Several UC;s noted. IVF bolus administered.  Difficulty tracing FHR due to maternal body habitus and preterm gestation.  FHR confirmed with doppler.  Dr. Adrian Blackwater updated. Pt can be OB cleared.

## 2018-04-26 NOTE — Discharge Instructions (Signed)
DISCHARGE INSTRUCTIONS FOR KIDNEY STONE/URETERAL STENT   MEDICATIONS:  1.  Resume all your other meds from home - except do not take any extra narcotic pain meds that you may have at home.  2. Pyridium is to help with the burning/stinging when you urinate.   ACTIVITY:  1. No strenuous activity x 1week  2. No driving while on narcotic pain medications  3. Drink plenty of water  4. Continue to walk at home - you can still get blood clots when you are at home, so keep active, but don't over do it.  5. May return to work/school tomorrow or when you feel ready   BATHING:  1. You can shower and we recommend daily showers    SIGNS/SYMPTOMS TO CALL:  Please call us if you have a fever greater than 101.5, uncontrolled nausea/vomiting, uncontrolled pain, dizziness, unable to urinate, bloody urine, chest pain, shortness of breath, leg swelling, leg pain, redness around wound, drainage from wound, or any other concerns or questions.   You can reach [place] at [phone].   FOLLOW-UP:  1. You will be scheduled for f/u in 3 months to discuss removal of your stone.

## 2018-04-27 ENCOUNTER — Other Ambulatory Visit

## 2018-04-27 ENCOUNTER — Encounter: Payer: Self-pay | Admitting: *Deleted

## 2018-04-27 ENCOUNTER — Encounter (HOSPITAL_COMMUNITY): Payer: Self-pay | Admitting: Urology

## 2018-04-28 LAB — CULTURE, OB URINE: Culture: 20000 — AB

## 2018-05-04 ENCOUNTER — Inpatient Hospital Stay (HOSPITAL_COMMUNITY)
Admission: AD | Admit: 2018-05-04 | Discharge: 2018-05-04 | Disposition: A | Discharge status: Home / Self Care | Source: Ambulatory Visit | Attending: Obstetrics & Gynecology | Admitting: Obstetrics & Gynecology

## 2018-05-04 ENCOUNTER — Inpatient Hospital Stay (HOSPITAL_BASED_OUTPATIENT_CLINIC_OR_DEPARTMENT_OTHER)

## 2018-05-04 ENCOUNTER — Encounter (HOSPITAL_COMMUNITY): Payer: Self-pay

## 2018-05-04 ENCOUNTER — Other Ambulatory Visit (INDEPENDENT_AMBULATORY_CARE_PROVIDER_SITE_OTHER): Admitting: Obstetrics and Gynecology

## 2018-05-04 VITALS — BP 115/71 | HR 86 | Temp 97.0°F | Wt 190.0 lb

## 2018-05-04 DIAGNOSIS — O99843 Bariatric surgery status complicating pregnancy, third trimester: Secondary | ICD-10-CM | POA: Insufficient documentation

## 2018-05-04 DIAGNOSIS — O4702 False labor before 37 completed weeks of gestation, second trimester: Secondary | ICD-10-CM | POA: Diagnosis not present

## 2018-05-04 DIAGNOSIS — O99213 Obesity complicating pregnancy, third trimester: Secondary | ICD-10-CM

## 2018-05-04 DIAGNOSIS — O34219 Maternal care for unspecified type scar from previous cesarean delivery: Secondary | ICD-10-CM | POA: Diagnosis not present

## 2018-05-04 DIAGNOSIS — O4442 Low lying placenta NOS or without hemorrhage, second trimester: Secondary | ICD-10-CM

## 2018-05-04 DIAGNOSIS — O99891 Other specified diseases and conditions complicating pregnancy: Secondary | ICD-10-CM

## 2018-05-04 DIAGNOSIS — Z3A28 28 weeks gestation of pregnancy: Secondary | ICD-10-CM

## 2018-05-04 DIAGNOSIS — O4700 False labor before 37 completed weeks of gestation, unspecified trimester: Secondary | ICD-10-CM

## 2018-05-04 DIAGNOSIS — O479 False labor, unspecified: Secondary | ICD-10-CM

## 2018-05-04 DIAGNOSIS — Z8759 Personal history of other complications of pregnancy, childbirth and the puerperium: Secondary | ICD-10-CM

## 2018-05-04 DIAGNOSIS — Z87891 Personal history of nicotine dependence: Secondary | ICD-10-CM | POA: Insufficient documentation

## 2018-05-04 DIAGNOSIS — Z3483 Encounter for supervision of other normal pregnancy, third trimester: Secondary | ICD-10-CM

## 2018-05-04 DIAGNOSIS — O4703 False labor before 37 completed weeks of gestation, third trimester: Secondary | ICD-10-CM

## 2018-05-04 DIAGNOSIS — O09213 Supervision of pregnancy with history of pre-term labor, third trimester: Secondary | ICD-10-CM

## 2018-05-04 DIAGNOSIS — O09293 Supervision of pregnancy with other poor reproductive or obstetric history, third trimester: Secondary | ICD-10-CM

## 2018-05-04 DIAGNOSIS — O4443 Low lying placenta NOS or without hemorrhage, third trimester: Secondary | ICD-10-CM

## 2018-05-04 DIAGNOSIS — M549 Dorsalgia, unspecified: Secondary | ICD-10-CM

## 2018-05-04 DIAGNOSIS — O4440 Low lying placenta NOS or without hemorrhage, unspecified trimester: Secondary | ICD-10-CM

## 2018-05-04 DIAGNOSIS — Z3689 Encounter for other specified antenatal screening: Secondary | ICD-10-CM

## 2018-05-04 DIAGNOSIS — O9989 Other specified diseases and conditions complicating pregnancy, childbirth and the puerperium: Secondary | ICD-10-CM

## 2018-05-04 DIAGNOSIS — O4400 Placenta previa specified as without hemorrhage, unspecified trimester: Secondary | ICD-10-CM

## 2018-05-04 DIAGNOSIS — S334XXA Traumatic rupture of symphysis pubis, initial encounter: Secondary | ICD-10-CM

## 2018-05-04 DIAGNOSIS — Z0371 Encounter for suspected problem with amniotic cavity and membrane ruled out: Secondary | ICD-10-CM

## 2018-05-04 LAB — URINALYSIS, ROUTINE W REFLEX MICROSCOPIC

## 2018-05-04 LAB — URINALYSIS, MICROSCOPIC (REFLEX): RBC / HPF: >50 RBC/hpf (ref 0–5)

## 2018-05-04 NOTE — MAU Provider Note (Signed)
History     CSN: [id number]  Arrival date and time: 05/04/18 1212   First Provider Initiated Contact with Patient 05/04/18 1245      Chief Complaint  Patient presents with  . Contractions   [id number] @28 .5 wks presenting with ctx. Hx of obstructing urethral stone and stent placement. Reports ctx started after stent placement 8 days ago but became more frequent 2 days ago. Ctx last night were q2 min and painful. Had some bloody mucous this am. No LOF. Good FM. Reports urinary frequency since stent placement.   OB History    Gravida  3   Para  2   Term  1   Preterm  1   AB      Living  2     SAB      TAB      Ectopic      Multiple  0   Live Births  2           Past Medical History:  Diagnosis Date  . Common migraine with intractable migraine 03/10/2018  . Kidney stone    Stent placed in Feb 2017  . PONV (postoperative nausea and vomiting)    nausea during last c section  . Supervision of normal pregnancy, antepartum 10/04/2015    Clinic  [facility] - transfer from Dr. Shawnie Pons Prenatal Labs Dating  Blood type:    Genetic Screen 1 Screen:    AFP:     Quad:     NIPS: Antibody:  Anatomic [place]  Nml fetal anatomy @ 18 wks; low lying placenta > resolved @23  wks Rubella:   GTT Early:               Third trimester:  RPR:    Flu vaccine  Declined HBsAg:    TDaP vaccine                                               Rhogam: HIV:    Baby Food  Breastfeed                                            GBS: (For PCN allergy, check sensitivities) Contraception  Pap: Circumcision  n/a female ([facility] ultrasound)  Pediatrician   Support Person  [name] (husband)      Past Surgical History:  Procedure Laterality Date  . CESAREAN SECTION    . CESAREAN SECTION N/A 02/20/2016   Procedure: REPEAT CESAREAN SECTION;  Surgeon: Hermina Staggers, MD;  Location: [facility] BIRTHING SUITES;  Service: Obstetrics;  Laterality: N/A;  . CYSTOSCOPY W/ URETERAL STENT PLACEMENT Left 04/26/2018   Procedure:  CYSTOSCOPY WITH RETROGRADE PYELOGRAM/URETERAL STENT PLACEMENT;  Surgeon: Crist Fat, MD;  Location: [facility] ORS;  Service: Urology;  Laterality: Left;  . FINGER SURGERY    . GASTRIC BYPASS    . kidney stint      Family History  Problem Relation Age of Onset  . Hypertension Father   . Heart disease Father   . Hearing loss Father     Social History   Tobacco Use  . Smoking status: Former Games developer  . Smokeless tobacco: Never Used  Substance Use Topics  . Alcohol use: No  . Drug use: No  Allergies:  Allergies  Allergen Reactions  . Alcohol Hives    Allergic to alcohol containing products including cough medicines that contain alcohol  . Flagyl [Metronidazole] Other (See Comments)    Facial flushing  . Hydrocodone-Acetaminophen Nausea And Vomiting    Medications Prior to Admission  Medication Sig Dispense Refill Last Dose  . acetaminophen (TYLENOL) 500 MG tablet Take 1,000 mg by mouth daily as needed for moderate pain.   Taking  . Prenatal Vit-Fe Fumarate-FA (PRENATAL VITAMIN PO) Take by mouth.   Taking  . promethazine (PHENERGAN) 25 MG tablet Take 1 tablet (25 mg total) by mouth every 6 (six) hours as needed for nausea or vomiting. (Patient not taking: Reported on 03/18/2018) 30 tablet 2 Not Taking    Review of Systems  Gastrointestinal: Positive for abdominal pain.  Genitourinary: Positive for frequency and vaginal bleeding. Negative for vaginal discharge.   Physical Exam   Blood pressure 125/63, pulse 73, temperature 97.9 F (36.6 C), temperature source Oral, resp. rate 16, last menstrual period 10/21/2017, SpO2 99 %.  Physical Exam  Constitutional: She is oriented to person, place, and time. She appears well-developed and well-nourished. No distress.  HENT:  Head: Normocephalic and atraumatic.  Neck: Normal range of motion.  Cardiovascular: Normal rate.  Respiratory: Effort normal. No respiratory distress.  GI: Soft. She exhibits no distension. There is no  abdominal tenderness.  gravid  Genitourinary:    Genitourinary Comments: Deferred> speculum in office today: visually closed   Musculoskeletal: Normal range of motion.  Neurological: She is alert and oriented to person, place, and time.  Skin: Skin is warm and dry.  Psychiatric: She has a normal mood and affect.  EFM: 140 bpm, mod variability, + accels, no decels Toco: rare ctx w/irritability  Results for orders placed or performed during the hospital encounter of 05/04/18 (from the past 24 hour(s))  Urinalysis, Routine w reflex microscopic     Status: Abnormal   Collection Time: 05/04/18 12:54 PM  Result Value Ref Range   Color, Urine RED (A) YELLOW   APPearance CLOUDY (A) CLEAR   Specific Gravity, Urine  1.005 - 1.030    TEST NOT REPORTED DUE TO COLOR INTERFERENCE OF URINE PIGMENT   pH  5.0 - 8.0    TEST NOT REPORTED DUE TO COLOR INTERFERENCE OF URINE PIGMENT   Glucose, UA (A) NEGATIVE mg/dL    TEST NOT REPORTED DUE TO COLOR INTERFERENCE OF URINE PIGMENT   Hgb urine dipstick (A) NEGATIVE    TEST NOT REPORTED DUE TO COLOR INTERFERENCE OF URINE PIGMENT   Bilirubin Urine (A) NEGATIVE    TEST NOT REPORTED DUE TO COLOR INTERFERENCE OF URINE PIGMENT   Ketones, ur (A) NEGATIVE mg/dL    TEST NOT REPORTED DUE TO COLOR INTERFERENCE OF URINE PIGMENT   Protein, ur (A) NEGATIVE mg/dL    TEST NOT REPORTED DUE TO COLOR INTERFERENCE OF URINE PIGMENT   Nitrite (A) NEGATIVE    TEST NOT REPORTED DUE TO COLOR INTERFERENCE OF URINE PIGMENT   Leukocytes,Ua (A) NEGATIVE    TEST NOT REPORTED DUE TO COLOR INTERFERENCE OF URINE PIGMENT  Urinalysis, Microscopic (reflex)     Status: Abnormal   Collection Time: 05/04/18 12:54 PM  Result Value Ref Range   RBC / HPF >50 0 - 5 RBC/hpf   WBC, UA 11-20 0 - 5 WBC/hpf   Bacteria, UA RARE (A) NONE SEEN   Squamous Epithelial / LPF 0-5 0 - 5   [place] Mfm Ob Limited  Result Date: 05/04/2018 ----------------------------------------------------------------------   OBSTETRICS REPORT                       (Signed Final 05/04/2018 01:25 pm) ---------------------------------------------------------------------- Patient Info  ID #:       [id number]                          D.O.B.:  [birth year] (27 yrs)  Name:       Gabrielle Roy                 Visit Date: 05/04/2018 01:00 pm ---------------------------------------------------------------------- Performed By  Performed By:     Tomma Lightning             Ref. Address:     [address]                    [address]                                                             [address]                                                             Jacky Kindle  Attending:        Noralee Space MD        [facility]  Referred By:      Hurshel Party CNM ---------------------------------------------------------------------- Orders   #  Description                          Code         Ordered By   1  [place] MFM OB LIMITED                    40981.19     Donette Larry  ----------------------------------------------------------------------   #  Order #                    Accession #                 Episode #   1  [id number]                  [id number]                  [id number]  ---------------------------------------------------------------------- Indications   Preterm contractions  O47.00   Low lying placenta, antepartum                 O44.40   [redacted] weeks gestation of pregnancy                Z3A.28   Previous cesarean delivery, antepartum (x2)    O34.219   Poor obstetric history: Previous               O09.299   preeclampsia / eclampsia/gestational HTN   Poor obstetric history: Previous preterm       O09.219   delivery, antepartum (35w, fetal intolerance)   Obesity complicating pregnancy, third          O99.213   trimester   Pregnancy complicated by previous gastric      O99.843   bypass, antepartum, third  trimester  ---------------------------------------------------------------------- Vital Signs  Weight (lb): 190                               Height:        5'2"  BMI:         34.75 ---------------------------------------------------------------------- Fetal Evaluation  Num Of Fetuses:         1  Fetal Heart Rate(bpm):  161  Cardiac Activity:       Observed  Presentation:           Cephalic  Placenta:               Posterior, low-lying, 1.4 cm from int os  Amniotic Fluid  AFI FV:      Within normal limits  AFI Sum(cm)     %Tile       Largest Pocket(cm)  17.43           65          6.25  RUQ(cm)       RLQ(cm)       LUQ(cm)        LLQ(cm)  6.25          3.91          3.32           3.95 ---------------------------------------------------------------------- OB History  Gravidity:    3         Term:   1        Prem:   1        SAB:   0  TOP:          0       Ectopic:  0        Living: 2 ---------------------------------------------------------------------- Gestational Age  LMP:           27w 6d        Date:  10/21/17                 EDD:   07/28/18  Best:          Eden Emms 5d     Det. By:  Previous Ultrasound      EDD:   07/22/18                                      (12/29/17) ---------------------------------------------------------------------- Cervix Uterus Adnexa  Cervix  Length:           4.06  cm.  Normal appearance by transabdominal scan. ---------------------------------------------------------------------- Impression  Patient was evaluated for c/o uterine contractions.  A limited ultrasound study was performed. Amniotic fluid is  normal and good fetal activity is seen. On transabdominal  scan, the cervix looks long and closed.  Placenta is low-lying (1.4 cm from the internal os). No  evidence of accreta. ----------------------------------------------------------------------                  Noralee Space, MD Electronically Signed Final Report   05/04/2018 01:25 pm  ----------------------------------------------------------------------  MAU Course  Procedures  MDM Chart review: low lying placenta, hx urethral stone w/stent, anxiety, hx gastric bypass, previous CS. No evidence of UTI or PTL. Pt reassured. Stable for discharge home.   Assessment and Plan  [redacted] weeks gestation Reactive NST Preterm contractions Discharge home Follow [facility] as scheduled PTL precautions Maintain hydration  Allergies as of 05/04/2018      Reactions   Alcohol Hives   Allergic to alcohol containing products including cough medicines that contain alcohol   Flagyl [metronidazole] Other (See Comments)   Facial flushing   Hydrocodone-acetaminophen Nausea And Vomiting      Medication List    TAKE these medications   acetaminophen 500 MG tablet Commonly known as:  TYLENOL Take 1,000 mg by mouth daily as needed for moderate pain.   PRENATAL VITAMIN PO Take by mouth.   promethazine 25 MG tablet Commonly known as:  PHENERGAN Take 1 tablet (25 mg total) by mouth every 6 (six) hours as needed for nausea or vomiting.      Donette Larry, CNM 05/04/2018, 2:16 PM

## 2018-05-04 NOTE — MAU Note (Addendum)
Pt having ctx more frequently since her stent placement. Very painful last night, had gone away for a bit but then came back strong this morning. Had some bloody mucous yesterday but was checked in the office this morning and was closed. Pain is 8/10.

## 2018-05-04 NOTE — Progress Notes (Signed)
   PRENATAL VISIT NOTE  Subjective:  Gabrielle Roy is a 28 y.o. G3P1102 at [redacted]w[redacted]d being seen today for ongoing prenatal care.  She is currently monitored for the following issues for this high-risk pregnancy and has Supervision of normal pregnancy, antepartum; Anxiety in pregnancy, antepartum; Depressive disorder, not elsewhere classified; History of gestational hypertension; Gastric bypass status for obesity; Previous cesarean section complicating pregnancy, antepartum condition or complication; Placenta previa affecting delivery; Common migraine with intractable migraine; No leakage of amniotic fluid into vagina; Back pain in pregnancy; Symphysis pubis disruption, initial encounter; Renal stones; and Urinary urgency on their problem list.  Patient walked into the office today requesting to be seen.  Patient reports contractions since 2-3 day ago.  Contractions: Irritability. Vag. Bleeding: None.  Movement: Present. Denies leaking of fluid.  Patient had left ureteral stent placed on 2/18; since then she has had off and on contractions. Today the contractions became so severe that she had to pull her call over on the side of the rd to get through the pain. She noticed some bloody mucoid discharge yesterday, none today. No recent intercourse.   The following portions of the patient's history were reviewed and updated as appropriate: allergies, current medications, past family history, past medical history, past social history, past surgical history and problem list. Problem list updated.  Objective:   Vitals:   05/04/18 0900  BP: 115/71  Pulse: 86  Temp: (!) 97 F (36.1 C)  Weight: 190 lb (86.2 kg)    Fetal Status: Fetal Heart Rate (bpm): 138   Movement: Present     General:  Alert, oriented and cooperative. Patient is in no acute distress.  Skin: Skin is warm and dry. No rash noted.   Cardiovascular: Normal heart rate noted  Respiratory: Normal respiratory effort, no problems with  respiration noted  Abdomen: Soft, gravid, appropriate for gestational age.  Pain/Pressure: Present     Pelvic: Cervical exam deferred       Speculum inserted into vagina: cervix appears closed, small amount of white discharge noted.   Extremities: Normal range of motion.  Edema: None  Mental Status: Normal mood and affect. Normal behavior. Normal judgment and thought content.   Assessment and Plan:  Pregnancy: G3P1102 at [redacted]w[redacted]d  1. Encounter for supervision of other normal pregnancy in third trimester  Glucose testing or 28 weeks not done today. Patient to reschedule for next week.  Currently having dumping syndrome; glucose test not recommended. Patient is agreeable to do glucose monitoring at home for 1 week and bring a log. We will discuss this more next week.   2. Threatened premature labor in third trimester  - Low lying placenta, digital exam not done.  - Fetal fibronectin collected and sent - patient to go to [facility] MAU for further assessment of preterm labor.  - Spoke to [place] CNM in MAU and she is aware the patient is coming.   Preterm labor symptoms and general obstetric precautions including but not limited to vaginal bleeding, contractions, leaking of fluid and fetal movement were reviewed in detail with the patient. Please refer to After Visit Summary for other counseling recommendations.  No follow-ups on file.  Future Appointments  Date Time Provider Department Center  05/13/2018  9:00 AM [facility]-M[facility] MD [facility]  05/13/2018  9:30 AM WH-MFC [place] 5 [facility] MFC-US  05/24/2018 10:15 AM Sharyon Cable, CNM [facility]  06/09/2018 12:45 PM Christia Reading Lindalou Hose, NP [facility] None    Venia Carbon, NP

## 2018-05-04 NOTE — Progress Notes (Signed)
Pt has bladder stent due to  [facility]

## 2018-05-04 NOTE — Discharge Instructions (Signed)
Braxton Hicks Contractions Contractions of the uterus can occur throughout pregnancy, but they are not always a sign that you are in labor. You may have practice contractions called Braxton Hicks contractions. These false labor contractions are sometimes confused with true labor. What are Braxton Hicks contractions? Braxton Hicks contractions are tightening movements that occur in the muscles of the uterus before labor. Unlike true labor contractions, these contractions do not result in opening (dilation) and thinning of the cervix. Toward the end of pregnancy (32-34 weeks), Braxton Hicks contractions can happen more often and may become stronger. These contractions are sometimes difficult to tell apart from true labor because they can be very uncomfortable. You should not feel embarrassed if you go to the hospital with false labor. Sometimes, the only way to tell if you are in true labor is for your health care provider to look for changes in the cervix. The health care provider will do a physical exam and may monitor your contractions. If you are not in true labor, the exam should show that your cervix is not dilating and your water has not broken. If there are no other health problems associated with your pregnancy, it is completely safe for you to be sent home with false labor. You may continue to have Braxton Hicks contractions until you go into true labor. How to tell the difference between true labor and false labor True labor  Contractions last 30-70 seconds.  Contractions become very regular.  Discomfort is usually felt in the top of the uterus, and it spreads to the lower abdomen and low back.  Contractions do not go away with walking.  Contractions usually become more intense and increase in frequency.  The cervix dilates and gets thinner. False labor  Contractions are usually shorter and not as strong as true labor contractions.  Contractions are usually irregular.  Contractions  are often felt in the front of the lower abdomen and in the groin.  Contractions may go away when you walk around or change positions while lying down.  Contractions get weaker and are shorter-lasting as time goes on.  The cervix usually does not dilate or become thin. Follow these instructions at home:   Take over-the-counter and prescription medicines only as told by your health care provider.  Keep up with your usual exercises and follow other instructions from your health care provider.  Eat and drink lightly if you think you are going into labor.  If Braxton Hicks contractions are making you uncomfortable: ? Change your position from lying down or resting to walking, or change from walking to resting. ? Sit and rest in a tub of warm water. ? Drink enough fluid to keep your urine pale yellow. Dehydration may cause these contractions. ? Do slow and deep breathing several times an hour.  Keep all follow-up prenatal visits as told by your health care provider. This is important. Contact a health care provider if:  You have a fever.  You have continuous pain in your abdomen. Get help right away if:  Your contractions become stronger, more regular, and closer together.  You have fluid leaking or gushing from your vagina.  You pass blood-tinged mucus (bloody show).  You have bleeding from your vagina.  You have low back pain that you never had before.  You feel your baby's head pushing down and causing pelvic pressure.  Your baby is not moving inside you as much as it used to. Summary  Contractions that occur before labor are   called Braxton Hicks contractions, false labor, or practice contractions.  Braxton Hicks contractions are usually shorter, weaker, farther apart, and less regular than true labor contractions. True labor contractions usually become progressively stronger and regular, and they become more frequent.  Manage discomfort from Braxton Hicks contractions  by changing position, resting in a warm bath, drinking plenty of water, or practicing deep breathing. This information is not intended to replace advice given to you by your health care provider. Make sure you discuss any questions you have with your health care provider. Document Released: 07/09/2016 Document Revised: 12/08/2016 Document Reviewed: 07/09/2016 Elsevier Interactive Patient Education  2019 [employer] Inc.  

## 2018-05-05 LAB — FETAL FIBRONECTIN: Fetal Fibronectin: NEGATIVE

## 2018-05-09 ENCOUNTER — Other Ambulatory Visit: Payer: Self-pay | Admitting: *Deleted

## 2018-05-09 MED ORDER — GLUCOSE BLOOD VI STRP: ORAL_STRIP | 12 refills | Status: DC

## 2018-05-09 MED ORDER — ACCU-CHEK AVIVA CONNECT W/DEVICE KIT: 1.0000 | PACK | Freq: Four times a day (QID) | 0 refills | Status: DC

## 2018-05-09 MED ORDER — ACCU-CHEK FASTCLIX LANCETS MISC: 2.0000 | Freq: Four times a day (QID) | 12 refills | Status: DC

## 2018-05-13 ENCOUNTER — Ambulatory Visit (HOSPITAL_COMMUNITY)

## 2018-05-13 ENCOUNTER — Encounter (HOSPITAL_COMMUNITY): Payer: Self-pay

## 2018-05-13 ENCOUNTER — Other Ambulatory Visit (HOSPITAL_COMMUNITY): Payer: Self-pay | Admitting: *Deleted

## 2018-05-13 ENCOUNTER — Ambulatory Visit (HOSPITAL_COMMUNITY): Admitting: *Deleted

## 2018-05-13 ENCOUNTER — Ambulatory Visit (HOSPITAL_COMMUNITY)
Admission: RE | Admit: 2018-05-13 | Discharge: 2018-05-13 | Disposition: A | Discharge status: Home / Self Care | Source: Ambulatory Visit | Attending: Certified Nurse Midwife | Admitting: Certified Nurse Midwife

## 2018-05-13 VITALS — BP 113/65 | HR 69 | Wt 192.0 lb

## 2018-05-13 DIAGNOSIS — O99213 Obesity complicating pregnancy, third trimester: Secondary | ICD-10-CM

## 2018-05-13 DIAGNOSIS — O4443 Low lying placenta NOS or without hemorrhage, third trimester: Secondary | ICD-10-CM

## 2018-05-13 DIAGNOSIS — Z362 Encounter for other antenatal screening follow-up: Secondary | ICD-10-CM

## 2018-05-13 DIAGNOSIS — O34219 Maternal care for unspecified type scar from previous cesarean delivery: Secondary | ICD-10-CM

## 2018-05-13 DIAGNOSIS — O09293 Supervision of pregnancy with other poor reproductive or obstetric history, third trimester: Secondary | ICD-10-CM | POA: Diagnosis not present

## 2018-05-13 DIAGNOSIS — O0990 Supervision of high risk pregnancy, unspecified, unspecified trimester: Secondary | ICD-10-CM | POA: Insufficient documentation

## 2018-05-13 DIAGNOSIS — O4400 Placenta previa specified as without hemorrhage, unspecified trimester: Secondary | ICD-10-CM | POA: Diagnosis not present

## 2018-05-13 DIAGNOSIS — O99843 Bariatric surgery status complicating pregnancy, third trimester: Secondary | ICD-10-CM

## 2018-05-13 DIAGNOSIS — O09213 Supervision of pregnancy with history of pre-term labor, third trimester: Secondary | ICD-10-CM | POA: Diagnosis not present

## 2018-05-13 DIAGNOSIS — Z3A30 30 weeks gestation of pregnancy: Secondary | ICD-10-CM

## 2018-05-13 DIAGNOSIS — O99840 Bariatric surgery status complicating pregnancy, unspecified trimester: Secondary | ICD-10-CM

## 2018-05-17 ENCOUNTER — Ambulatory Visit (INDEPENDENT_AMBULATORY_CARE_PROVIDER_SITE_OTHER): Admitting: Advanced Practice Midwife

## 2018-05-17 VITALS — BP 112/62 | HR 70 | Wt 190.0 lb

## 2018-05-17 DIAGNOSIS — O99891 Other specified diseases and conditions complicating pregnancy: Secondary | ICD-10-CM

## 2018-05-17 DIAGNOSIS — N200 Calculus of kidney: Secondary | ICD-10-CM

## 2018-05-17 DIAGNOSIS — O24410 Gestational diabetes mellitus in pregnancy, diet controlled: Secondary | ICD-10-CM

## 2018-05-17 DIAGNOSIS — Z3A30 30 weeks gestation of pregnancy: Secondary | ICD-10-CM

## 2018-05-17 DIAGNOSIS — O34219 Maternal care for unspecified type scar from previous cesarean delivery: Secondary | ICD-10-CM

## 2018-05-17 DIAGNOSIS — O4440 Low lying placenta NOS or without hemorrhage, unspecified trimester: Secondary | ICD-10-CM

## 2018-05-17 DIAGNOSIS — O9989 Other specified diseases and conditions complicating pregnancy, childbirth and the puerperium: Secondary | ICD-10-CM

## 2018-05-17 DIAGNOSIS — O4443 Low lying placenta NOS or without hemorrhage, third trimester: Secondary | ICD-10-CM

## 2018-05-17 DIAGNOSIS — M549 Dorsalgia, unspecified: Secondary | ICD-10-CM

## 2018-05-17 MED ORDER — TRAMADOL HCL 50 MG PO TABS: 50.0000 mg | ORAL_TABLET | Freq: Four times a day (QID) | ORAL | 0 refills | Status: DC | PRN

## 2018-05-17 NOTE — Progress Notes (Signed)
   PRENATAL VISIT NOTE  Subjective:  Gabrielle Roy is a 28 y.o. G3P1102 at [redacted]w[redacted]d being seen today for ongoing prenatal care.  She is currently monitored for the following issues for this high-risk pregnancy and has Supervision of normal pregnancy; Anxiety in pregnancy, antepartum; Depressive disorder, not elsewhere classified; History of gestational hypertension; Gastric bypass status for obesity; Previous cesarean section complicating pregnancy, antepartum condition or complication; Placenta previa affecting delivery; Common migraine with intractable migraine; No leakage of amniotic fluid into vagina; Back pain in pregnancy; Symphysis pubis disruption, initial encounter; Renal stones; Urinary urgency; and Threatened premature labor in third trimester on their problem list.  Patient reports backache and occasional contractions.  Contractions: Not present. Vag. Bleeding: None.  Movement: Present. Denies leaking of fluid.   The following portions of the patient's history were reviewed and updated as appropriate: allergies, current medications, past family history, past medical history, past social history, past surgical history and problem list.   Objective:   Vitals:   05/17/18 1029  BP: 112/62  Pulse: 70  Weight: 86.2 kg    Fetal Status: Fetal Heart Rate (bpm): 139   Movement: Present     General:  Alert, oriented and cooperative. Patient is in no acute distress.  Skin: Skin is warm and dry. No rash noted.   Cardiovascular: Normal heart rate noted  Respiratory: Normal respiratory effort, no problems with respiration noted  Abdomen: Soft, gravid, appropriate for gestational age.  Pain/Pressure: Present     Pelvic: SSE with visually closed/long cervix  Extremities: Normal range of motion.  Edema: None  Mental Status: Normal mood and affect. Normal behavior. Normal judgment and thought content.   Assessment and Plan:  Pregnancy: G3P1102 at [redacted]w[redacted]d 1. Back pain in pregnancy --Pt with  increased intermittent severe pain in her low back and rectum.  --Pt taking 3500 mg Tylenol daily for pain.   --SSE due to low lying placenta, cervix visually closed.  2. Previous cesarean section complicating pregnancy, antepartum condition or complication --Plans repeat C/S  3. Low-lying placenta --Placenta 1.6-1.9 cm from os on 05/13/18  4. Renal calculi --Urology placed stent on 04/26/18.  Pt has frank hematuria.  Taking 3500 mg Tylenol daily. --Reduce Tylenol to total of 3 g or less daily, including Tramadol, see Rx. - traMADol (ULTRAM) 50 MG tablet; Take 1 tablet (50 mg total) by mouth every 6 (six) hours as needed.  Dispense: 30 tablet; Refill: 0 Preterm labor symptoms and general obstetric precautions including but not limited to vaginal bleeding, contractions, leaking of fluid and fetal movement were reviewed in detail with the patient. Please refer to After Visit Summary for other counseling recommendations.   5. Diet controlled gestational diabetes mellitus (GDM) in third trimester --Reviewed glucose log.  Fasting CBG range 80-118 with one out of 7 out of range. PP CBG range 87-131 with 2 out of 15 out of range. Discussed dietary changes to increase protein, decrease carbohydrates.    , remain on current therapy.   No follow-ups on file.  Future Appointments  Date Time Provider Department Center  05/24/2018 10:15 AM Sharyon Cable, CNM [facility]  06/09/2018 12:45 PM Glean Salvo, NP [facility] None  06/10/2018 12:50 PM WH-MFC NURSE [facility]  06/10/2018  1:00 PM WH-MFC [place] 3 [facility] MFC-US    Sharen Counter, CNM

## 2018-05-17 NOTE — Patient Instructions (Signed)

## 2018-05-20 ENCOUNTER — Encounter: Payer: Self-pay | Admitting: Advanced Practice Midwife

## 2018-05-24 ENCOUNTER — Encounter: Payer: Self-pay | Admitting: Certified Nurse Midwife

## 2018-05-25 ENCOUNTER — Encounter (HOSPITAL_COMMUNITY): Payer: Self-pay | Admitting: *Deleted

## 2018-05-25 ENCOUNTER — Other Ambulatory Visit: Payer: Self-pay

## 2018-05-25 ENCOUNTER — Inpatient Hospital Stay (HOSPITAL_COMMUNITY)
Admission: AD | Admit: 2018-05-25 | Discharge: 2018-05-25 | Disposition: A | Discharge status: Home / Self Care | Source: Ambulatory Visit | Attending: Family Medicine | Admitting: Family Medicine

## 2018-05-25 ENCOUNTER — Telehealth: Payer: Self-pay

## 2018-05-25 DIAGNOSIS — O4693 Antepartum hemorrhage, unspecified, third trimester: Secondary | ICD-10-CM | POA: Diagnosis not present

## 2018-05-25 DIAGNOSIS — O4403 Placenta previa specified as without hemorrhage, third trimester: Secondary | ICD-10-CM

## 2018-05-25 DIAGNOSIS — Z91048 Other nonmedicinal substance allergy status: Secondary | ICD-10-CM | POA: Insufficient documentation

## 2018-05-25 DIAGNOSIS — N201 Calculus of ureter: Secondary | ICD-10-CM | POA: Insufficient documentation

## 2018-05-25 DIAGNOSIS — O4400 Placenta previa specified as without hemorrhage, unspecified trimester: Secondary | ICD-10-CM

## 2018-05-25 DIAGNOSIS — Z885 Allergy status to narcotic agent status: Secondary | ICD-10-CM | POA: Insufficient documentation

## 2018-05-25 DIAGNOSIS — O4703 False labor before 37 completed weeks of gestation, third trimester: Secondary | ICD-10-CM

## 2018-05-25 DIAGNOSIS — Z87891 Personal history of nicotine dependence: Secondary | ICD-10-CM | POA: Insufficient documentation

## 2018-05-25 DIAGNOSIS — Z79899 Other long term (current) drug therapy: Secondary | ICD-10-CM | POA: Insufficient documentation

## 2018-05-25 DIAGNOSIS — O99843 Bariatric surgery status complicating pregnancy, third trimester: Secondary | ICD-10-CM | POA: Insufficient documentation

## 2018-05-25 DIAGNOSIS — O34219 Maternal care for unspecified type scar from previous cesarean delivery: Secondary | ICD-10-CM

## 2018-05-25 DIAGNOSIS — Z960 Presence of urogenital implants: Secondary | ICD-10-CM

## 2018-05-25 DIAGNOSIS — Z881 Allergy status to other antibiotic agents status: Secondary | ICD-10-CM | POA: Insufficient documentation

## 2018-05-25 DIAGNOSIS — Z3A31 31 weeks gestation of pregnancy: Secondary | ICD-10-CM

## 2018-05-25 DIAGNOSIS — Z8751 Personal history of pre-term labor: Secondary | ICD-10-CM | POA: Insufficient documentation

## 2018-05-25 LAB — URINALYSIS, ROUTINE W REFLEX MICROSCOPIC
Bilirubin Urine: NEGATIVE
GLUCOSE, UA: NEGATIVE mg/dL
Ketones, ur: NEGATIVE mg/dL
Nitrite: NEGATIVE
Protein, ur: 30 mg/dL — AB
Specific Gravity, Urine: 1.004 — ABNORMAL LOW (ref 1.005–1.030)
pH: 7 (ref 5.0–8.0)

## 2018-05-25 MED ORDER — NIFEDIPINE ER OSMOTIC RELEASE 30 MG PO TB24: 30.0000 mg | ORAL_TABLET | Freq: Every day | ORAL | 0 refills | Status: DC | PRN

## 2018-05-25 MED ORDER — ONDANSETRON HCL 4 MG PO TABS
4.0000 mg | ORAL_TABLET | Freq: Once | ORAL | Status: AC
  Administered 2018-05-25: 4 mg via ORAL
  Filled 2018-05-25: qty 1

## 2018-05-25 MED ORDER — NIFEDIPINE 10 MG PO CAPS
10.0000 mg | ORAL_CAPSULE | ORAL | Status: DC | PRN
  Administered 2018-05-25 (×3): 10 mg via ORAL
  Filled 2018-05-25 (×3): qty 1

## 2018-05-25 NOTE — Telephone Encounter (Signed)
PT called stating that she was seen in MAU this morning and was given Procardia. She states she is allergic to it and has hives and she is sweating. She wants to know if any other medication can be sent in for her. I spoke to Steward Drone, CNM and she said to tell pt to take Benadryl, stay hydrated and do not take Procardia. She is not sending in any other medication. Pt expressed understanding.

## 2018-05-25 NOTE — MAU Note (Signed)
NOTICED SPOTTING ON PAD THEN SWABBED  WITH Q-TIP- LIGHT PINK.  THIS AM

## 2018-05-25 NOTE — MAU Provider Note (Addendum)
Chief Complaint:  Contractions   First Provider Initiated Contact with Patient 05/25/18 0703      HPI: Gabrielle Roy is a 28 y.o. G3P1102 at 39w5dho presents to maternity admissions reporting lower abdominal pain this morning unresponsive to Tramadol.  Also saw some pink spotting.  Wasn't sure if it was from her ureteral stent or vagina. Used swab and though it came from vagina.  Has a left ureteral stone with stent placed on 04/26/2018.   Does have a history of one preterm delivery She reports good fetal movement, denies LOF, vaginal itching/burning, urinary symptoms, h/a, dizziness, n/v, diarrhea, constipation or fever/chills.    RN Note: PT SAYS SHE STARTED  HURTING AT 3 AND TOOK TRAMADOL AT 4-  SOME RELIEF. PNC WITH  [facility]     NOTICED SPOTTING ON PAD THEN SWABBED  WITH Q-TIP- LIGHT PINK.  THIS AM   Past Medical History: Past Medical History:  Diagnosis Date  . Common migraine with intractable migraine 03/10/2018  . Kidney stone    Stent placed in Feb 2017  . PONV (postoperative nausea and vomiting)    nausea during last c section  . Supervision of normal pregnancy, antepartum 10/04/2015    [place] transfer from Dr. DMicah NoelPrenatal Labs Dating  Blood type:    Genetic Screen 1 Screen:    AFP:     Quad:     NIPS: Antibody:  Anatomic [place] Nml fetal anatomy @ 18 wks; low lying placenta > resolved '@23'  wks Rubella:   GTT Early:               Third trimester:  RPR:    Flu vaccine  Declined HBsAg:    TDaP vaccine                                               Rhogam: HIV:    Baby Food  Breastfeed                                            GBS: (For PCN allergy, check sensitivities) Contraception  Pap: Circumcision  n/a female ([facility] ultrasound)  Pediatrician   Support Person  JJeneen Rinks(husband)      Past obstetric history: OB History  Gravida Para Term Preterm AB Living  '3 2 1 1   2  ' SAB TAB Ectopic Multiple Live Births        0 2    # Outcome Date GA Lbr Len/2nd Weight  Sex Delivery Anes PTL Lv  3 Current           2 Term 02/20/16 [id number] g F CS-LTranv Spinal  LIV  1 Preterm 07/20/12 [id number] g F CS-LTranv  N LIV     Complications: Fetal Intolerance, Oligohydramnios antepartum    Past Surgical History: Past Surgical History:  Procedure Laterality Date  . CESAREAN SECTION    . CESAREAN SECTION N/A 02/20/2016   Procedure: REPEAT CESAREAN SECTION;  Surgeon: MicChancy MilroyD;  [facility] [place];  Laterality: N/A;  . CYSTOSCOPY W/ URETERAL STENT PLACEMENT Left 04/26/2018   Procedure: CYSTOSCOPY WITH RETROGRADE PYELOGRAM/URETERAL STENT PLACEMENT;  Surgeon: HerArdis HughsD;  Location:  [facility];  Service: Urology;  Laterality: Left;  . FINGER SURGERY    . GASTRIC BYPASS    . kidney stint      Family History: Family History  Problem Relation Age of Onset  . Hypertension Father   . Heart disease Father   . Hearing loss Father     Social History: Social History   Tobacco Use  . Smoking status: Former Research scientist (life sciences)  . Smokeless tobacco: Never Used  Substance Use Topics  . Alcohol use: No  . Drug use: No    Allergies:  Allergies  Allergen Reactions  . Alcohol Hives    Allergic to alcohol containing products including cough medicines that contain alcohol  . Flagyl [Metronidazole] Other (See Comments)    Facial flushing  . Hydrocodone-Acetaminophen Nausea And Vomiting    Meds:  Medications Prior to Admission  Medication Sig Dispense Refill Last Dose  . amitriptyline (ELAVIL) 10 MG tablet    05/24/2018 at Unknown time  . Prenatal Vit-Fe Fumarate-FA (PRENATAL VITAMIN PO) Take by mouth.   Past Week at Unknown time  . traMADol (ULTRAM) 50 MG tablet Take 1 tablet (50 mg total) by mouth every 6 (six) hours as needed. 30 tablet 0 05/25/2018 at Unknown time  . ACCU-CHEK FASTCLIX LANCETS MISC 2 Devices by Percutaneous route 4 (four) times daily. 100 each 12 Taking  . acetaminophen (TYLENOL) 500 MG tablet Take  1,000 mg by mouth daily as needed for moderate pain.   More than a month at Unknown time  . Blood Glucose Monitoring Suppl (ACCU-CHEK AVIVA CONNECT) w/Device KIT 1 kit by Does not apply route 4 (four) times daily. 1 kit 0 Taking  . glucose blood (ACCU-CHEK AVIVA) test strip Use testing strips QID to check CBG 100 each 12 Taking  . promethazine (PHENERGAN) 25 MG tablet Take 1 tablet (25 mg total) by mouth every 6 (six) hours as needed for nausea or vomiting. (Patient not taking: Reported on 03/18/2018) 30 tablet 2 Not Taking    I have reviewed patient's Past Medical Hx, Surgical Hx, Family Hx, Social Hx, medications and allergies.   ROS:  Review of Systems  Constitutional: Negative for chills and fever.  Gastrointestinal: Positive for abdominal pain and nausea. Negative for constipation and diarrhea.  Genitourinary: Positive for pelvic pain and vaginal bleeding. Negative for difficulty urinating.  Musculoskeletal: Negative for back pain.  Neurological: Negative for dizziness.   Other systems negative  Physical Exam   Patient Vitals for the past 24 hrs:  BP Temp Temp src Pulse Resp Height Weight  05/25/18 0632 (!) 107/57 98.6 F (37 C) Oral 80 18 '5\' 1"'  (1.549 m) 88.2 kg   Constitutional: Well-developed, well-nourished female in no acute distress.  Cardiovascular: normal rate and rhythm Respiratory: normal effort, clear to auscultation bilaterally GI: Abd soft, non-tender, gravid appropriate for gestational age.   No rebound or guarding. MS: Extremities nontender, no edema, normal ROM Neurologic: Alert and oriented x 4.  GU: Neg CVAT.  PELVIC EXAM: Cervix pink, visually closed, without lesion, scant clear discharge, vaginal walls and external genitalia normal     No blood visible   Dilation: Closed Effacement (%): Thick Exam by:: williams cnm  FHT:  Baseline 140 , moderate variability, accelerations present, no decelerations Contractions: q 3 mins Irregular     Labs:  O/RH(D)  POSITIVE/-- (10/23 1416)  Imaging:  05/13/2018 US showed placental edge 1.6cm from os  MAU Course/MDM: I have ordered labs and reviewed results. Urine culture sent.  04/25/2018 showed 20k colonies of e. Coli.  NST reviewed and is reassuring with irregular mild contractions. Difficult to trace contractions.    Treatments in MAU included Procardia for contractions, Zofran for nausea.    Report given to oncoming provider  Hansel Feinstein CNM, MSN Certified Nurse-Midwife 05/25/2018 8:08 AM  Reassessment of patient: patient reports contractions have spaced and are no longer painful after medication. Cervix closed.   Educated and discussed hydration and increasing consumption of water to 8-10 bottles per day, patient is currently drinking 4 bottles of water. Rx for Procardia 30XL ordered for home use for contractions. Discussed reasons to return to MAU. Follow up as scheduled in the office. Pt stable at time of discharge. NST reassuring for gestational age.  Assessment: Single intrauterine pregnancy at 103w5dPreterm uterine contractions Vaginal bleeding reported by patient, negative exam Ureteral stone with ureteral stent in place, left  Plan: Discharge home Follow up as scheduled in the office  Return to MAU as needed Continue tramadol as needed  Rx for Procardia XL 358m Hydration and rest  Fetal kick counts   [place].   Specialty:  Obstetrics and Gynecology Why:  Follow up as scheduled for prenatal appointments and return to MAU as needed Contact information: [phone]        Allergies as of 05/25/2018      Reactions   Alcohol Hives   Allergic to alcohol containing products including cough medicines that contain alcohol   Flagyl [metronidazole] Other (See Comments)   Facial flushing   Hydrocodone-acetaminophen Nausea And Vomiting       Medication List    STOP taking these medications   amitriptyline 10 MG tablet Commonly known as:  ELAVIL     TAKE these medications   Accu-Chek Aviva Connect w/Device Kit 1 kit by Does not apply route 4 (four) times daily.   Accu-Chek FastClix Lancets Misc 2 Devices by Percutaneous route 4 (four) times daily.   acetaminophen 500 MG tablet Commonly known as:  TYLENOL Take 1,000 mg by mouth daily as needed for moderate pain.   glucose blood test strip Commonly known as:  Accu-Chek Aviva Use testing strips QID to check CBG   NIFEdipine 30 MG 24 hr tablet Commonly known as:  PROCARDIA-XL/NIFEDICAL-XL Take 1 tablet (30 mg total) by mouth daily as needed (painful contractions).   PRENATAL VITAMIN PO Take by mouth.   promethazine 25 MG tablet Commonly known as:  PHENERGAN Take 1 tablet (25 mg total) by mouth every 6 (six) hours as needed for nausea or vomiting.   traMADol 50 MG tablet Commonly known as:  ULTRAM Take 1 tablet (50 mg total) by mouth every 6 (six) hours as needed.      VeLajean ManesCNM 05/25/18, 9:35 AM

## 2018-05-25 NOTE — MAU Note (Signed)
PT SAYS SHE STARTED  HURTING AT 3 AND TOOK TRAMADOL AT 4-  SOME RELIEF. PNC WITH  [facility] .

## 2018-05-26 LAB — CULTURE, OB URINE: Culture: <10000 — AB

## 2018-05-27 ENCOUNTER — Telehealth: Admitting: Family

## 2018-05-27 DIAGNOSIS — A499 Bacterial infection, unspecified: Secondary | ICD-10-CM

## 2018-05-27 DIAGNOSIS — N390 Urinary tract infection, site not specified: Principal | ICD-10-CM

## 2018-05-27 MED ORDER — NITROFURANTOIN MONOHYD MACRO 100 MG PO CAPS: 100.0000 mg | ORAL_CAPSULE | Freq: Two times a day (BID) | ORAL | 0 refills | Status: DC

## 2018-05-27 NOTE — Progress Notes (Signed)

## 2018-05-28 ENCOUNTER — Encounter (HOSPITAL_COMMUNITY): Payer: Self-pay | Admitting: *Deleted

## 2018-05-28 ENCOUNTER — Inpatient Hospital Stay (HOSPITAL_COMMUNITY)
Admission: AD | Admit: 2018-05-28 | Discharge: 2018-05-28 | Disposition: A | Discharge status: Home / Self Care | Attending: Obstetrics and Gynecology | Admitting: Obstetrics and Gynecology

## 2018-05-28 ENCOUNTER — Other Ambulatory Visit: Payer: Self-pay

## 2018-05-28 DIAGNOSIS — Z9884 Bariatric surgery status: Secondary | ICD-10-CM

## 2018-05-28 DIAGNOSIS — R102 Pelvic and perineal pain: Secondary | ICD-10-CM | POA: Diagnosis not present

## 2018-05-28 DIAGNOSIS — O99843 Bariatric surgery status complicating pregnancy, third trimester: Secondary | ICD-10-CM | POA: Diagnosis not present

## 2018-05-28 DIAGNOSIS — Z87891 Personal history of nicotine dependence: Secondary | ICD-10-CM | POA: Diagnosis not present

## 2018-05-28 DIAGNOSIS — Z0371 Encounter for suspected problem with amniotic cavity and membrane ruled out: Secondary | ICD-10-CM

## 2018-05-28 DIAGNOSIS — O99891 Other specified diseases and conditions complicating pregnancy: Secondary | ICD-10-CM

## 2018-05-28 DIAGNOSIS — M549 Dorsalgia, unspecified: Secondary | ICD-10-CM

## 2018-05-28 DIAGNOSIS — Z885 Allergy status to narcotic agent status: Secondary | ICD-10-CM | POA: Diagnosis not present

## 2018-05-28 DIAGNOSIS — Z881 Allergy status to other antibiotic agents status: Secondary | ICD-10-CM | POA: Insufficient documentation

## 2018-05-28 DIAGNOSIS — R109 Unspecified abdominal pain: Secondary | ICD-10-CM

## 2018-05-28 DIAGNOSIS — Z3A32 32 weeks gestation of pregnancy: Secondary | ICD-10-CM | POA: Diagnosis not present

## 2018-05-28 DIAGNOSIS — Z87442 Personal history of urinary calculi: Secondary | ICD-10-CM | POA: Insufficient documentation

## 2018-05-28 DIAGNOSIS — O9989 Other specified diseases and conditions complicating pregnancy, childbirth and the puerperium: Secondary | ICD-10-CM

## 2018-05-28 DIAGNOSIS — O26893 Other specified pregnancy related conditions, third trimester: Secondary | ICD-10-CM | POA: Diagnosis present

## 2018-05-28 DIAGNOSIS — O34219 Maternal care for unspecified type scar from previous cesarean delivery: Secondary | ICD-10-CM

## 2018-05-28 DIAGNOSIS — Z8759 Personal history of other complications of pregnancy, childbirth and the puerperium: Secondary | ICD-10-CM

## 2018-05-28 DIAGNOSIS — Z3480 Encounter for supervision of other normal pregnancy, unspecified trimester: Secondary | ICD-10-CM

## 2018-05-28 DIAGNOSIS — F411 Generalized anxiety disorder: Secondary | ICD-10-CM

## 2018-05-28 DIAGNOSIS — S334XXA Traumatic rupture of symphysis pubis, initial encounter: Secondary | ICD-10-CM

## 2018-05-28 MED ORDER — TRAMADOL HCL 50 MG PO TABS
50.0000 mg | ORAL_TABLET | Freq: Once | ORAL | Status: AC
  Administered 2018-05-28: 50 mg via ORAL
  Filled 2018-05-28: qty 1

## 2018-05-28 MED ORDER — LACTATED RINGERS IV BOLUS
1000.0000 mL | Freq: Once | INTRAVENOUS | Status: AC
  Administered 2018-05-28: 1000 mL via INTRAVENOUS

## 2018-05-28 MED ORDER — CYCLOBENZAPRINE HCL 10 MG PO TABS
10.0000 mg | ORAL_TABLET | Freq: Once | ORAL | Status: AC
  Administered 2018-05-28: 10 mg via ORAL
  Filled 2018-05-28: qty 1

## 2018-05-28 NOTE — MAU Note (Signed)
Pt states she has been having contractions everyday for a while now.  She said she got procardia but is allergic to it (hives).  She states she has a UTI and a stent and feels a lot of pain down there thinks something is wrong with her stent.  Denies VB/LOF.  Reports positive fetal movement.

## 2018-05-28 NOTE — Discharge Instructions (Signed)

## 2018-05-29 NOTE — MAU Provider Note (Addendum)
History     CSN: [id number]  Arrival date and time: 05/28/18 2048   First Provider Initiated Contact with Patient 05/28/18 2113      Chief Complaint  Patient presents with  . Vaginal Pain  . Contractions   HPI Gabrielle Roy is a 28 y.o. [id number] at [redacted]w[redacted]d who presents to MAU with chief complaint of lower abdominal pain and discomfort with voiding. This is a recurring problem for which patient has been seen by both MAU Providers and her PCP. She is s/p placement or ureteral stent 04/26/2018.  She reports she was told to call her surgeon's office if she experienced a recurrence of discomfort. She has not been able to do so.   Patient is s/p treatment with Macrobid for UTI. She is compliant with prescribed medications. She denies urinary symptoms today. She also denies flank pain, abdominal tenderness, fever.  OB History    Gravida  3   Para  2   Term  1   Preterm  1   AB      Living  2     SAB      TAB      Ectopic      Multiple  0   Live Births  2           Past Medical History:  Diagnosis Date  . Common migraine with intractable migraine 03/10/2018  . Kidney stone    Stent placed in Feb 2017  . PONV (postoperative nausea and vomiting)    nausea during last c section  . Supervision of normal pregnancy, antepartum 10/04/2015    Clinic  [place] - transfer from Dr. Shawnie Pons Prenatal Labs Dating  Blood type:    Genetic Screen 1 Screen:    AFP:     Quad:     NIPS: Antibody:  Anatomic [place]  Nml fetal anatomy @ 18 wks; low lying placenta > resolved @23  wks Rubella:   GTT Early:               Third trimester:  RPR:    Flu vaccine  Declined HBsAg:    TDaP vaccine                                               Rhogam: HIV:    Baby Food  Breastfeed                                            GBS: (For PCN allergy, check sensitivities) Contraception  Pap: Circumcision  n/a female ([facility] ultrasound)  Pediatrician   Support Person  Gabrielle Roy (husband)      Past Surgical  History:  Procedure Laterality Date  . CESAREAN SECTION    . CESAREAN SECTION N/A 02/20/2016   Procedure: REPEAT CESAREAN SECTION;  Surgeon: Hermina Staggers, MD;  Location: [facility];  Service: Obstetrics;  Laterality: N/A;  . CYSTOSCOPY W/ URETERAL STENT PLACEMENT Left 04/26/2018   Procedure: CYSTOSCOPY WITH RETROGRADE PYELOGRAM/URETERAL STENT PLACEMENT;  Surgeon: Crist Fat, MD;  Location: [facility];  Service: Urology;  Laterality: Left;  . FINGER SURGERY    . GASTRIC BYPASS    . kidney stint      Family History  Problem Relation Age  of Onset  . Hypertension Father   . Heart disease Father   . Hearing loss Father     Social History   Tobacco Use  . Smoking status: Former Games developer  . Smokeless tobacco: Never Used  Substance Use Topics  . Alcohol use: No  . Drug use: No    Allergies:  Allergies  Allergen Reactions  . Alcohol Hives    Allergic to alcohol containing products including cough medicines that contain alcohol  . Flagyl [Metronidazole] Other (See Comments)    Facial flushing  . Hydrocodone-Acetaminophen Nausea And Vomiting    No medications prior to admission.    Review of Systems  Constitutional: Negative for chills, fatigue and fever.  Gastrointestinal: Positive for abdominal pain. Negative for nausea and vomiting.  Genitourinary: Negative for difficulty urinating, dyspareunia, flank pain, vaginal bleeding, vaginal discharge and vaginal pain.  Musculoskeletal: Negative for back pain.  Neurological: Negative for headaches.  All other systems reviewed and are negative.  Physical Exam   Blood pressure 117/63, pulse 70, temperature 98.1 F (36.7 C), temperature source Oral, resp. rate 18, last menstrual period 10/21/2017, SpO2 100 %.  Physical Exam  Nursing note and vitals reviewed. Constitutional: She appears well-developed and well-nourished.  Cardiovascular: Normal rate.  Respiratory: Effort normal.  GI: There is no abdominal  tenderness. There is no rebound and no CVA tenderness.  Gravid  Musculoskeletal: Normal range of motion.  Neurological: She is alert.  Skin: Skin is warm and dry.  Psychiatric: She has a normal mood and affect. Her behavior is normal. Judgment and thought content normal.    MAU Course  Procedures  Patient Vitals for the past 24 hrs:  BP Temp Temp src Pulse Resp SpO2  05/28/18 2311 117/63 - - 70 18 -  05/28/18 2105 (!) 109/59 98.1 F (36.7 C) Oral 86 17 100 %    --Discussed with patient that she is s/p evaluation with urinalysis and vaginal swabs within the past week. She declines re-collection of these labs based on lack of change in chief complaint since those evaluations --No reduction in pain score with Flexeril --Reactive tracing: baseline 130, moderate variability, positive accels, no decels --Toco: irregular mild contractions with UI --Cervix FT exterior, closed internally.  Meds ordered this encounter  Medications  . lactated ringers bolus 1,000 mL  . cyclobenzaprine (FLEXERIL) tablet 10 mg  . traMADol (ULTRAM) tablet 50 mg    Patient tolerating well for post-op discomfort without negative reaction     Assessment and Plan  --28 y.o. P3S4199 at [redacted]w[redacted]d  --Reactive fetal tracing --Pain associated with ureteral stent and fetal positioning --Discharge home in stable condition  F/U: Patient has appt with PCP tomorrow 05/30/2018  Calvert Cantor, CNM 05/29/2018, 2:47 AM

## 2018-05-30 ENCOUNTER — Ambulatory Visit (INDEPENDENT_AMBULATORY_CARE_PROVIDER_SITE_OTHER): Admitting: Obstetrics & Gynecology

## 2018-05-30 ENCOUNTER — Other Ambulatory Visit: Payer: Self-pay

## 2018-05-30 VITALS — BP 107/59 | HR 83 | Temp 97.2°F | Wt 193.0 lb

## 2018-05-30 DIAGNOSIS — Z3A32 32 weeks gestation of pregnancy: Secondary | ICD-10-CM

## 2018-05-30 DIAGNOSIS — N200 Calculus of kidney: Secondary | ICD-10-CM

## 2018-05-30 DIAGNOSIS — O0990 Supervision of high risk pregnancy, unspecified, unspecified trimester: Secondary | ICD-10-CM

## 2018-05-30 DIAGNOSIS — O26893 Other specified pregnancy related conditions, third trimester: Secondary | ICD-10-CM

## 2018-05-30 DIAGNOSIS — Z3403 Encounter for supervision of normal first pregnancy, third trimester: Secondary | ICD-10-CM

## 2018-05-30 MED ORDER — TRAMADOL HCL 50 MG PO TABS: 50.0000 mg | ORAL_TABLET | Freq: Four times a day (QID) | ORAL | 0 refills | Status: DC | PRN

## 2018-05-30 MED ORDER — CEPHALEXIN 500 MG PO CAPS: 500.0000 mg | ORAL_CAPSULE | Freq: Every day | ORAL | 1 refills | Status: DC

## 2018-05-30 MED ORDER — TAMSULOSIN HCL 0.4 MG PO CAPS: 0.4000 mg | ORAL_CAPSULE | Freq: Every day | ORAL | 1 refills | Status: DC

## 2018-05-30 NOTE — Progress Notes (Signed)
Urine culture sent  Pt notified that she will be started on Flomax and ATB will change to Keflex.  Verbally explained how she is to void(positioning).  Pt states that she understands.    PRENATAL VISIT NOTE  Subjective:  Gabrielle Roy is a 28 y.o. G3P1102 at [redacted]w[redacted]d being seen today for ongoing prenatal care.  She is currently monitored for the following issues for this high-risk pregnancy and has Supervision of normal pregnancy; Anxiety state; Depressive disorder, not elsewhere classified; History of gestational hypertension; Gastric bypass status for obesity; Previous cesarean section complicating pregnancy, antepartum condition or complication; Common migraine with intractable migraine; No leakage of amniotic fluid into vagina; Back pain in pregnancy; Symphysis pubis disruption, initial encounter; Renal stones; Urinary urgency; Threatened premature labor in third trimester; Former smoker; Gastroesophageal reflux disease without esophagitis; Postoperative intestinal malabsorption; S/P laparoscopic sleeve gastrectomy; History of preterm delivery; and Late Entry into [facility] opt schedule to limit social distancing due to COVID-19 on their problem list.  Patient reports left back pain, contractions, bladder pain--feels like her stent is in the wrong place.  Contractions: Irritability. Vag. Bleeding: None.  Movement: Present. Denies leaking of fluid.   The following portions of the patient's history were reviewed and updated as appropriate: allergies, current medications, past family history, past medical history, past social history, past surgical history and problem list.   Objective:   Vitals:   05/30/18 1129  BP: (!) 107/59  Pulse: 83  Temp: (!) 97.2 F (36.2 C)  Weight: 193 lb (87.5 kg)    Fetal Status:     Movement: Present     General:  Alert, oriented and cooperative. Patient is in no acute distress.  Skin: Skin is warm and dry. No rash noted.   Cardiovascular: Normal heart rate noted   Respiratory: Normal respiratory effort, no problems with respiration noted  Abdomen: Soft, gravid, appropriate for gestational age.  Pain/Pressure: Present     Pelvic: Cervical exam deferred       Pt had cervical exam in MAU recently  Extremities: Normal range of motion.  Edema: None  Mental Status: Normal mood and affect. Normal behavior. Normal judgment and thought content.   Assessment and Plan:  Pregnancy: G3P1102 at [redacted]w[redacted]d 1. Encounter for supervision of normal first pregnancy in third trimester - Babyscripts Schedule Optimization  2. Late Entry into [facility] opt schedule to limit social distancing due to COVID-19  3. Renal calculi bedisde US show urinary retention.  Can see tail of ureteral catheter. Texted with Dr. Lowella Petties good voiding posture, suggested flomax daily, and kelfex daily for suppression.  Will need bladder retraining after delivery and stent removed. U cx resent today. Pt will have stone removed and catheter removed after delivery.  Virtual visit on April 6th with weekly BP and weight readings.   - traMADol (ULTRAM) 50 MG tablet; Take 1 tablet (50 mg total) by mouth every 6 (six) hours as needed.  Dispense: 30 tablet; Refill: 0 - Culture, OB Urine  Term labor symptoms and general obstetric precautions including but not limited to vaginal bleeding, contractions, leaking of fluid and fetal movement were reviewed in detail with the patient. Please refer to After Visit Summary for other counseling recommendations.   No follow-ups on file.  Future Appointments  Date Time Provider Department Center  06/09/2018 12:45 PM Glean Salvo, NP [facility] None  06/10/2018 12:50 PM [facility] NURSE [facility]  06/10/2018  1:00 PM [facility] [place] 3 [facility]  06/13/2018 10:35 AM Elsie Lincoln  Rexene Edison, MD [facility]  07/04/2018 11:00 AM Allie Bossier, MD [facility]    Elsie Lincoln, MD

## 2018-06-01 LAB — URINE CULTURE, OB REFLEX

## 2018-06-01 LAB — CULTURE, OB URINE

## 2018-06-08 ENCOUNTER — Other Ambulatory Visit: Payer: Self-pay

## 2018-06-08 ENCOUNTER — Ambulatory Visit (INDEPENDENT_AMBULATORY_CARE_PROVIDER_SITE_OTHER): Admitting: Neurology

## 2018-06-08 ENCOUNTER — Encounter: Payer: Self-pay | Admitting: Neurology

## 2018-06-08 DIAGNOSIS — G43019 Migraine without aura, intractable, without status migrainosus: Secondary | ICD-10-CM

## 2018-06-08 NOTE — Progress Notes (Signed)
Virtual Visit via Video Note  I connected with Gabrielle Roy on 06/08/18 at  1:15 PM EDT by a video enabled telemedicine application and verified that I am speaking with the correct person using two identifiers.   I discussed the limitations of evaluation and management by telemedicine and the availability of in person appointments. The patient expressed understanding and agreed to proceed.  History of Present Illness: June 08, 2018 SS: Gabrielle Roy is a 28 year old female with history of migraine, is currently pregnant due date is Jul 22, 2018. She was started on nortriptyline at her last visit increasing to 30 mg at bedtime, phenergan as needed.  She reports that her headaches have improved.  She is only having about 1 headache per month.  She is taking nortriptyline 10 mg at bedtime.  She has not had to take the Phenergan. She reports she is tolerating the medication well, however it does make her drowsy, sleep well at night.  She is concerned that when she has the baby she may need to switch to something else because the nortriptyline makes her sleepy.  She does report she had had a urinary stent placed.  Overall her headaches are doing much better.  She is planning to breast-feed.  03/10/2018 Dr. Anne Hahn: Gabrielle Roy is a 28 year old right-handed white female with a history of current pregnancy, due date is on 22 Jul 2018.  The patient has had headaches since she was a teenager, her usual headache frequency is 3-4 headaches a week, but since her pregnancy the headaches have been daily in nature.  The headaches are mainly in the bifrontal regions, and may be associated with blurred vision and some nausea and occasional vomiting.  The patient is having very frequent periods of incapacity because of the headache currently, she is having a lot of difficulty being able to care for her 2 other children.  The patient does not note any particular activating factors for headache.  She denies any family history of  headache.  She reports no numbness or weakness of the face, arms, legs.  She denies any neck stiffness or sinus drainage.  She currently is taking Fioricet for her headache if needed, she may occasionally take Tylenol.  She drinks on average one caffeinated soda daily, no other caffeine intake.   Observations/Objective: Alert, speech is clear and concise, follows commands, answers questions appropriately, face is symmetric  Assessment and Plan: 1.  Migraine headache, [redacted] weeks pregnant  She will continue taking nortriptyline 10 mg at bedtime for migraine headache.  She reports good benefit with this.  She is only had 1 headache in the last month.  She is concerned she may need to switch to something else after she has her baby because the medication makes her sleepy, is afraid she will not be able to get up in the middle of the night.  She is planning to breast-feed.  She will call our office if she decides she wants to make a switch.  We could try propanolol, which is safe for breastfeeding.  I advised her that if her symptoms worsen or she develops any new symptoms she should let us know.  Follow Up Instructions:  She will follow-up in 4-6 months for revisit   I discussed the assessment and treatment plan with the patient. The patient was provided an opportunity to ask questions and all were answered. The patient agreed with the plan and demonstrated an understanding of the instructions.   The patient was  advised to call back or seek an in-person evaluation if the symptoms worsen or if the condition fails to improve as anticipated.  I provided 20 minutes of non-face-to-face time during this encounter.   Otila Kluver, DNP  [facility] [address] [place], [place] [address] [phone]

## 2018-06-08 NOTE — Progress Notes (Signed)
I have read the note, and I agree with the clinical assessment and plan.  Brolin Dambrosia K Dymond Spreen   

## 2018-06-09 ENCOUNTER — Ambulatory Visit: Admitting: Neurology

## 2018-06-09 ENCOUNTER — Telehealth: Payer: Self-pay | Admitting: *Deleted

## 2018-06-09 NOTE — Telephone Encounter (Signed)
Pt called stating that she is having rectal pressure and loose stools for several days.  She denies any fever.  She is wondering if she is starting into labor or in labor.  She is not having contractions so I advised to use Imodium AD or Kaopectate for the loose stools.  Labor is going to be associated with pain.  She is currently [redacted] weeks gestation.

## 2018-06-10 ENCOUNTER — Ambulatory Visit (HOSPITAL_COMMUNITY): Admitting: *Deleted

## 2018-06-10 ENCOUNTER — Encounter (HOSPITAL_COMMUNITY): Payer: Self-pay

## 2018-06-10 ENCOUNTER — Ambulatory Visit (HOSPITAL_COMMUNITY)
Admission: RE | Admit: 2018-06-10 | Discharge: 2018-06-10 | Disposition: A | Discharge status: Home / Self Care | Source: Ambulatory Visit | Attending: Obstetrics and Gynecology | Admitting: Obstetrics and Gynecology

## 2018-06-10 ENCOUNTER — Other Ambulatory Visit: Payer: Self-pay

## 2018-06-10 VITALS — BP 113/63 | HR 63 | Temp 97.9°F

## 2018-06-10 DIAGNOSIS — O0990 Supervision of high risk pregnancy, unspecified, unspecified trimester: Secondary | ICD-10-CM | POA: Insufficient documentation

## 2018-06-10 DIAGNOSIS — O09213 Supervision of pregnancy with history of pre-term labor, third trimester: Secondary | ICD-10-CM

## 2018-06-10 DIAGNOSIS — O99213 Obesity complicating pregnancy, third trimester: Secondary | ICD-10-CM

## 2018-06-10 DIAGNOSIS — O34219 Maternal care for unspecified type scar from previous cesarean delivery: Secondary | ICD-10-CM | POA: Diagnosis not present

## 2018-06-10 DIAGNOSIS — Z362 Encounter for other antenatal screening follow-up: Secondary | ICD-10-CM | POA: Diagnosis not present

## 2018-06-10 DIAGNOSIS — O99840 Bariatric surgery status complicating pregnancy, unspecified trimester: Secondary | ICD-10-CM | POA: Diagnosis not present

## 2018-06-10 DIAGNOSIS — O4443 Low lying placenta NOS or without hemorrhage, third trimester: Secondary | ICD-10-CM

## 2018-06-10 DIAGNOSIS — O99843 Bariatric surgery status complicating pregnancy, third trimester: Secondary | ICD-10-CM

## 2018-06-10 DIAGNOSIS — O09293 Supervision of pregnancy with other poor reproductive or obstetric history, third trimester: Secondary | ICD-10-CM | POA: Diagnosis not present

## 2018-06-10 DIAGNOSIS — Z3A34 34 weeks gestation of pregnancy: Secondary | ICD-10-CM

## 2018-06-13 ENCOUNTER — Ambulatory Visit (INDEPENDENT_AMBULATORY_CARE_PROVIDER_SITE_OTHER): Admitting: Obstetrics & Gynecology

## 2018-06-13 ENCOUNTER — Other Ambulatory Visit: Payer: Self-pay

## 2018-06-13 VITALS — BP 100/69 | Wt 188.0 lb

## 2018-06-13 DIAGNOSIS — Z9884 Bariatric surgery status: Secondary | ICD-10-CM

## 2018-06-13 DIAGNOSIS — F411 Generalized anxiety disorder: Secondary | ICD-10-CM

## 2018-06-13 DIAGNOSIS — O99343 Other mental disorders complicating pregnancy, third trimester: Secondary | ICD-10-CM

## 2018-06-13 DIAGNOSIS — Z3A34 34 weeks gestation of pregnancy: Secondary | ICD-10-CM

## 2018-06-13 DIAGNOSIS — O0993 Supervision of high risk pregnancy, unspecified, third trimester: Secondary | ICD-10-CM

## 2018-06-13 MED ORDER — CEPHALEXIN 500 MG PO CAPS: 500.0000 mg | ORAL_CAPSULE | Freq: Every day | ORAL | 1 refills | Status: DC

## 2018-06-13 MED ORDER — SPECIMEN CATHETER FEMALE MISC: 1.0000 | Freq: Four times a day (QID) | 6 refills | Status: DC

## 2018-06-13 MED ORDER — TRAMADOL HCL 50 MG PO TABS: 50.0000 mg | ORAL_TABLET | Freq: Four times a day (QID) | ORAL | 0 refills | Status: DC | PRN

## 2018-06-13 NOTE — Progress Notes (Signed)
TELEHEALTH VIRTUAL OBSTETRICS VISIT ENCOUNTER NOTE  I connected with Gabrielle Roy on 06/13/18 at 10:35 AM EDT by telephone at home and verified that I am speaking with the correct person using two identifiers.   I discussed the limitations, risks, security and privacy concerns of performing an evaluation and management service by telephone and the availability of in person appointments. I also discussed with the patient that there may be a patient responsible charge related to this service. The patient expressed understanding and agreed to proceed.  Subjective:  Gabrielle Roy is a 28 y.o. G3P1102 at [redacted]w[redacted]d being followed for ongoing prenatal care.  She is currently monitored for the following issues for this high-risk pregnancy and has Supervision of normal pregnancy; Anxiety state; Depressive disorder, not elsewhere classified; History of gestational hypertension; Gastric bypass status for obesity; Previous cesarean section complicating pregnancy, antepartum condition or complication; Common migraine with intractable migraine; No leakage of amniotic fluid into vagina; Back pain in pregnancy; Symphysis pubis disruption, initial encounter; Renal stones; Urinary urgency; Threatened premature labor in third trimester; Former smoker; Gastroesophageal reflux disease without esophagitis; Postoperative intestinal malabsorption; S/P laparoscopic sleeve gastrectomy; History of preterm delivery; and Late Entry into [facility] opt schedule to limit social distancing due to COVID-19 on their problem list.  Patient reports continuous bladder pressure.  Patient does empty her bladder when she takes the Flomax.  She will pee every 30 minutes for several hours.  After the Flomax wears off, she only has dribbles.  Patient also having gross hematuria due to the catheter and stone.  Pt having diarrhea 10 times a day--just a little bit with each trip to the bathroom.  Pt called gastric surgeon who recommended more protein and  to take Metamucil.  Last fetal US on 06/10/18 shows  Reports fetal movement. Denies any contractions, bleeding or leaking of fluid.   The following portions of the patient's history were reviewed and updated as appropriate: allergies, current medications, past family history, past medical history, past social history, past surgical history and problem list.   Objective:   General:  Alert, oriented and cooperative.   Mental Status: Normal mood and affect perceived. Normal judgment and thought content.  Rest of physical exam deferred due to type of encounter  Wt 188 today (last week was 190) nml growth [place] on April 2nd  Assessment and Plan:  Pregnancy: G3P1102 at [redacted]w[redacted]d Continue pain from bladder--most likely retention or irritation from stent --phone call out to Dr. Danie Chandler with qid self cath; continue daily keflex  --discussed early delivery with Dr. Judeth Cornfield; we need more information first.   --check micronutrients at next visit  Complete blood count ?Ferritin ?Iron ?Vitamin B12 ?Thiamine ?Folate ?Calcium ?Vitamin D  -patient requestiong more tramadol.  Will need UDS at next visit.  PMP aware inquiry shows no other controlled substances other than those associated with our office.  Last Tramadol 05/30/18  Preterm labor symptoms and general obstetric precautions including but not limited to vaginal bleeding, contractions, leaking of fluid and fetal movement were reviewed in detail with the patient.  I discussed the assessment and treatment plan with the patient. The patient was provided an opportunity to ask questions and all were answered. The patient agreed with the plan and demonstrated an understanding of the instructions. The patient was advised to call back or seek an in-person office evaluation/go to MAU at [facility] for any urgent or concerning symptoms. Please refer to After Visit Summary for other counseling recommendations.  I provided 20 minutes of  non-face-to-face time during this encounter.  No follow-ups on file.  Future Appointments  Date Time Provider Department Center  07/04/2018 11:00 AM Allie Bossier, MD [facility]    Elsie Lincoln, MD Center for [facility], [facility]

## 2018-06-14 ENCOUNTER — Telehealth: Payer: Self-pay | Admitting: *Deleted

## 2018-06-14 NOTE — Telephone Encounter (Signed)
Pt called after her first self cath to give results.  Voided 100 cc post void residual 160 cc.Pt noted that the urine was bloody and mucus.  She is currently on Keflex.

## 2018-06-18 ENCOUNTER — Encounter (HOSPITAL_COMMUNITY): Payer: Self-pay

## 2018-06-18 ENCOUNTER — Other Ambulatory Visit: Payer: Self-pay

## 2018-06-18 ENCOUNTER — Inpatient Hospital Stay (HOSPITAL_COMMUNITY)
Admission: AD | Admit: 2018-06-18 | Discharge: 2018-06-18 | Disposition: A | Discharge status: Home / Self Care | Attending: Family Medicine | Admitting: Family Medicine

## 2018-06-18 DIAGNOSIS — R102 Pelvic and perineal pain: Secondary | ICD-10-CM | POA: Diagnosis not present

## 2018-06-18 DIAGNOSIS — O34219 Maternal care for unspecified type scar from previous cesarean delivery: Secondary | ICD-10-CM | POA: Insufficient documentation

## 2018-06-18 DIAGNOSIS — O26893 Other specified pregnancy related conditions, third trimester: Secondary | ICD-10-CM

## 2018-06-18 DIAGNOSIS — Z0371 Encounter for suspected problem with amniotic cavity and membrane ruled out: Secondary | ICD-10-CM

## 2018-06-18 DIAGNOSIS — Z881 Allergy status to other antibiotic agents status: Secondary | ICD-10-CM | POA: Diagnosis not present

## 2018-06-18 DIAGNOSIS — O26899 Other specified pregnancy related conditions, unspecified trimester: Secondary | ICD-10-CM

## 2018-06-18 DIAGNOSIS — O368130 Decreased fetal movements, third trimester, not applicable or unspecified: Secondary | ICD-10-CM

## 2018-06-18 DIAGNOSIS — Z87891 Personal history of nicotine dependence: Secondary | ICD-10-CM | POA: Diagnosis not present

## 2018-06-18 DIAGNOSIS — O9989 Other specified diseases and conditions complicating pregnancy, childbirth and the puerperium: Secondary | ICD-10-CM | POA: Insufficient documentation

## 2018-06-18 DIAGNOSIS — Z3689 Encounter for other specified antenatal screening: Secondary | ICD-10-CM | POA: Diagnosis not present

## 2018-06-18 DIAGNOSIS — Z3A35 35 weeks gestation of pregnancy: Secondary | ICD-10-CM | POA: Diagnosis not present

## 2018-06-18 DIAGNOSIS — R109 Unspecified abdominal pain: Secondary | ICD-10-CM | POA: Diagnosis not present

## 2018-06-18 DIAGNOSIS — Z886 Allergy status to analgesic agent status: Secondary | ICD-10-CM | POA: Insufficient documentation

## 2018-06-18 DIAGNOSIS — Z79899 Other long term (current) drug therapy: Secondary | ICD-10-CM | POA: Diagnosis not present

## 2018-06-18 DIAGNOSIS — Z91048 Other nonmedicinal substance allergy status: Secondary | ICD-10-CM | POA: Diagnosis not present

## 2018-06-18 LAB — URINALYSIS, ROUTINE W REFLEX MICROSCOPIC
Bacteria, UA: NONE SEEN
Bilirubin Urine: NEGATIVE
Glucose, UA: NEGATIVE mg/dL
Ketones, ur: NEGATIVE mg/dL
Nitrite: NEGATIVE
Protein, ur: NEGATIVE mg/dL
Specific Gravity, Urine: 1.001 — ABNORMAL LOW (ref 1.005–1.030)
pH: 8 (ref 5.0–8.0)

## 2018-06-18 LAB — POCT FERN TEST: POCT Fern Test: NEGATIVE

## 2018-06-18 NOTE — Discharge Instructions (Signed)
Fetal Movement Counts °Patient Name: ________________________________________________ Patient Due Date: ____________________ °What is a fetal movement count? ° °A fetal movement count is the number of times that you feel your baby move during a certain amount of time. This may also be called a fetal kick count. A fetal movement count is recommended for every pregnant woman. You may be asked to start counting fetal movements as early as week 28 of your pregnancy. °Pay attention to when your baby is most active. You may notice your baby's sleep and wake cycles. You may also notice things that make your baby move more. You should do a fetal movement count: °· When your baby is normally most active. °· At the same time each day. °A good time to count movements is while you are resting, after having something to eat and drink. °How do I count fetal movements? °1. Find a quiet, comfortable area. Sit, or lie down on your side. °2. Write down the date, the start time and stop time, and the number of movements that you felt between those two times. Take this information with you to your health care visits. °3. For 2 hours, count kicks, flutters, swishes, rolls, and jabs. You should feel at least 10 movements during 2 hours. °4. You may stop counting after you have felt 10 movements. °5. If you do not feel 10 movements in 2 hours, have something to eat and drink. Then, keep resting and counting for 1 hour. If you feel at least 4 movements during that hour, you may stop counting. °Contact a health care provider if: °· You feel fewer than 4 movements in 2 hours. °· Your baby is not moving like he or she usually does. °Date: ____________ Start time: ____________ Stop time: ____________ Movements: ____________ °Date: ____________ Start time: ____________ Stop time: ____________ Movements: ____________ °Date: ____________ Start time: ____________ Stop time: ____________ Movements: ____________ °Date: ____________ Start time:  ____________ Stop time: ____________ Movements: ____________ °Date: ____________ Start time: ____________ Stop time: ____________ Movements: ____________ °Date: ____________ Start time: ____________ Stop time: ____________ Movements: ____________ °Date: ____________ Start time: ____________ Stop time: ____________ Movements: ____________ °Date: ____________ Start time: ____________ Stop time: ____________ Movements: ____________ °Date: ____________ Start time: ____________ Stop time: ____________ Movements: ____________ °This information is not intended to replace advice given to you by your health care provider. Make sure you discuss any questions you have with your health care provider. °Document Released: 03/25/2006 Document Revised: 10/23/2015 Document Reviewed: 04/04/2015 °Elsevier Interactive Patient Education © 2019 [employer] °Signs and Symptoms of Labor °Labor is your body's natural process of moving your baby, placenta, and umbilical cord out of your uterus. The process of labor usually starts when your baby is full-term, between 37 and 40 weeks of pregnancy. °How will I know when I am close to going into labor? °As your body prepares for labor and the birth of your baby, you may notice the following symptoms in the weeks and days before true labor starts: °· Having a strong desire to get your home ready to receive your new baby. This is called nesting. Nesting may be a sign that labor is approaching, and it may occur several weeks before birth. Nesting may involve cleaning and organizing your home. °· Passing a small amount of thick, bloody mucus out of your vagina (normal bloody show or losing your mucus plug). This may happen more than a week before labor begins, or it might occur right before labor begins as the opening of the cervix   starts to widen (dilate). For some women, the entire mucus plug passes at once. For others, smaller portions of the mucus plug may gradually pass over several  days. °· Your baby moving (dropping) lower in your pelvis to get into position for birth (lightening). When this happens, you may feel more pressure on your bladder and pelvic bone and less pressure on your ribs. This may make it easier to breathe. It may also cause you to need to urinate more often and have problems with bowel movements. °· Having "practice contractions" (Braxton Hicks contractions) that occur at irregular (unevenly spaced) intervals that are more than 10 minutes apart. This is also called false labor. False labor contractions are common after exercise or sexual activity, and they will stop if you change position, rest, or drink fluids. These contractions are usually mild and do not get stronger over time. They may feel like: °? A backache or back pain. °? Mild cramps, similar to menstrual cramps. °? Tightening or pressure in your abdomen. °Other early symptoms that labor may be starting soon include: °· Nausea or loss of appetite. °· Diarrhea. °· Having a sudden burst of energy, or feeling very tired. °· Mood changes. °· Having trouble sleeping. °How will I know when labor has begun? °Signs that true labor has begun may include: °· Having contractions that come at regular (evenly spaced) intervals and increase in intensity. This may feel like more intense tightening or pressure in your abdomen that moves to your back. °? Contractions may also feel like rhythmic pain in your upper thighs or back that comes and goes at regular intervals. °? For first-time mothers, this change in intensity of contractions often occurs at a more gradual pace. °? Women who have given birth before may notice a more rapid progression of contraction changes. °· Having a feeling of pressure in the vaginal area. °· Your water breaking (rupture of membranes). This is when the sac of fluid that surrounds your baby breaks. When this happens, you will notice fluid leaking from your vagina. This may be clear or blood-tinged.  Labor usually starts within 24 hours of your water breaking, but it may take longer to begin. °? Some women notice this as a gush of fluid. °? Others notice that their underwear repeatedly becomes damp. °Follow these instructions at home: ° °· When labor starts, or if your water breaks, call your health care provider or nurse care line. Based on your situation, they will determine when you should go in for an exam. °· When you are in early labor, you may be able to rest and manage symptoms at home. Some strategies to try at home include: °? Breathing and relaxation techniques. °? Taking a warm bath or shower. °? Listening to music. °? Using a heating pad on the lower back for pain. If you are directed to use heat: °§ Place a towel between your skin and the heat source. °§ Leave the heat on for 20-30 minutes. °§ Remove the heat if your skin turns bright red. This is especially important if you are unable to feel pain, heat, or cold. You may have a greater risk of getting burned. °Get help right away if: °· You have painful, regular contractions that are 5 minutes apart or less. °· Labor starts before you are [redacted] weeks along in your pregnancy. °· You have a fever. °· You have a headache that does not go away. °· You have bright red blood coming from your vagina. °·   You do not feel your baby moving. °· You have a sudden onset of: °? Severe headache with vision problems. °? Nausea, vomiting, or diarrhea. °? Chest pain or shortness of breath. °These symptoms may be an emergency. If your health care provider recommends that you go to the hospital or birth center where you plan to deliver, do not drive yourself. Have someone else drive you, or call emergency services (911 in the [place] °Summary °· Labor is your body's natural process of moving your baby, placenta, and umbilical cord out of your uterus. °· The process of labor usually starts when your baby is full-term, between 37 and 40 weeks of pregnancy. °· When labor  starts, or if your water breaks, call your health care provider or nurse care line. Based on your situation, they will determine when you should go in for an exam. °This information is not intended to replace advice given to you by your health care provider. Make sure you discuss any questions you have with your health care provider. °Document Released: 07/31/2016 Document Revised: 07/31/2016 Document Reviewed: 07/31/2016 °Elsevier Interactive Patient Education © 2019 Elsevier Inc. ° °

## 2018-06-18 NOTE — MAU Provider Note (Signed)
Chief Complaint:  Contractions; Rupture of Membranes; Decreased Fetal Movement; and Vaginal Pain   First Provider Initiated Contact with Patient 06/18/18 1216     HPI: Gabrielle Roy is a 28 y.o. G3P1102 at 4730w1d who presents to maternity admissions reporting vaginal pain, contractions, leaking of fluid, and decreased fetal movement. History of c/section x 2, one of which was at 35 wks d/t oligo & fetal intolerance. Current pregnancy has been complicated by kidney stone s/p stent placement. Is currently taking suppressive abx & has to self cath for retention.  Currently denies any change in her urinary symptoms. Was able to void in MAU without catheterization.  Reports intermittent sharp vaginal pains since last night & contractions this morning. Can't tell how frequent they are. Has also had a few episodes of leaking clear fluid since last night.  Decreased fetal movement since this morning. Felt baby move once since this morning. Reports active fetus now since being in MAU.  Denies vaginal bleeding. No recent intercourse. Denies fever/chills.   Location: abdomen & vagina Quality: sharp, contractions Severity: 8/10 in pain scale Duration: <1 day Timing: intermittent Modifying factors: none Associated signs and symptoms: LOF?  Past Medical History:  Diagnosis Date  . Common migraine with intractable migraine 03/10/2018  . Kidney stone    Stent placed in Feb 2017  . PONV (postoperative nausea and vomiting)    nausea during last c section  . Supervision of normal pregnancy, antepartum 10/04/2015    [place] transfer from Dr. Shawnie Ponsorn Prenatal Labs Dating  Blood type:    Genetic Screen 1 Screen:    AFP:     Quad:     NIPS: Antibody:  Anatomic US  Nml fetal anatomy @ 18 wks; low lying placenta > resolved @23  wks Rubella:   GTT Early:               Third trimester:  RPR:    Flu vaccine  Declined HBsAg:    TDaP vaccine                                               Rhogam: HIV:    Baby  Food  Breastfeed                                            GBS: (For PCN allergy, check sensitivities) Contraception  Pap: Circumcision  n/a female ([employer] ultrasound)  Pediatrician   Support Person  [name] (husband)     OB History  Gravida Para Term Preterm AB Living  3 2 1 1   2   SAB TAB Ectopic Multiple Live Births        0 2    # Outcome Date GA Lbr Len/2nd Weight Sex Delivery Anes PTL Lv  3 Current           2 Term 02/20/16 8923w2d  4040 g F CS-LTranv Spinal  LIV  1 Preterm 07/20/12 7081w0d  2268 g F CS-LTranv  N LIV     Complications: Fetal Intolerance, Oligohydramnios antepartum   Past Surgical History:  Procedure Laterality Date  . CESAREAN SECTION    . CESAREAN SECTION N/A 02/20/2016   Procedure: REPEAT CESAREAN SECTION;  Surgeon: Hermina StaggersMichael L Ervin, MD;  Location: [facility];  Service: Obstetrics;  Laterality: N/A;  . CYSTOSCOPY W/ URETERAL STENT PLACEMENT Left 04/26/2018   Procedure: CYSTOSCOPY WITH RETROGRADE PYELOGRAM/URETERAL STENT PLACEMENT;  Surgeon: Crist Fat, MD;  Location: [facility] ORS;  Service: Urology;  Laterality: Left;  . FINGER SURGERY    . GASTRIC BYPASS     Family History  Problem Relation Age of Onset  . Hypertension Father   . Heart disease Father   . Hearing loss Father    Social History   Tobacco Use  . Smoking status: Former Games developer  . Smokeless tobacco: Never Used  Substance Use Topics  . Alcohol use: No  . Drug use: No   Allergies  Allergen Reactions  . Alcohol Hives    Allergic to alcohol containing products including cough medicines that contain alcohol  . Flagyl [Metronidazole] Other (See Comments)    Facial flushing  . Hydrocodone-Acetaminophen Nausea And Vomiting   Medications Prior to Admission  Medication Sig Dispense Refill Last Dose  . acetaminophen (TYLENOL) 500 MG tablet Take 1,000 mg by mouth daily as needed for moderate pain.   Taking  . Catheters (SPECIMEN CATHETER FEMALE) MISC 1 Device by Does not apply route 4  (four) times daily. Pt is to use catheter QID for self cath 120 each 6   . cephALEXin (KEFLEX) 500 MG capsule Take 1 capsule (500 mg total) by mouth daily. 30 capsule 1   . Prenatal Vit-Fe Fumarate-FA (PRENATAL VITAMIN PO) Take by mouth.   Taking  . tamsulosin (FLOMAX) 0.4 MG CAPS capsule Take 1 capsule (0.4 mg total) by mouth daily. 30 capsule 1 Taking  . traMADol (ULTRAM) 50 MG tablet Take 1 tablet (50 mg total) by mouth every 6 (six) hours as needed. 30 tablet 0 Taking  . traMADol (ULTRAM) 50 MG tablet Take 1 tablet (50 mg total) by mouth every 6 (six) hours as needed. 60 tablet 0     I have reviewed patient's Past Medical Hx, Surgical Hx, Family Hx, Social Hx, medications and allergies.   ROS:  Review of Systems  Constitutional: Negative.   Gastrointestinal: Positive for abdominal pain.  Genitourinary: Positive for frequency, hematuria, vaginal discharge and vaginal pain. Negative for decreased urine volume, dysuria, flank pain, genital sores and vaginal bleeding.    Physical Exam   Patient Vitals for the past 24 hrs:  BP Temp Temp src Pulse Resp SpO2 Height Weight  06/18/18 1310 110/61 - - - - - - -  06/18/18 1151 118/70 97.7 F (36.5 C) Oral 77 18 100 % - -  06/18/18 1146 - - - - - -  (1.549 m) 90.3 kg    Constitutional: Well-developed, well-nourished female in no acute distress.  Cardiovascular: normal rate & rhythm, no murmur Respiratory: normal effort, lung sounds clear throughout GI: Abd soft, non-tender, gravid appropriate for gestational age. Pos BS x 4 MS: Extremities nontender, no edema, normal ROM Neurologic: Alert and oriented x 4.  GU:      SSE: NEFG. Small amount of white mucoid discharge. No pooling of fluid. No blood   Dilation: Closed Effacement (%): Thick Cervical Position: Posterior Exam by:: Judeth Horn NP   NST:  Baseline: 130 bpm, Variability: Good {> 6 bpm), Accelerations: Reactive and Decelerations: Absent   Labs: Results for orders placed  or performed during the hospital encounter of 06/18/18 (from the past 24 hour(s))  Urinalysis, Routine w reflex microscopic     Status: Abnormal   Collection Time: 06/18/18 11:59 AM  Result Value Ref Range   Color, Urine STRAW (A) YELLOW   APPearance CLEAR CLEAR   Specific Gravity, Urine 1.001 (L) 1.005 - 1.030   pH 8.0 5.0 - 8.0   Glucose, UA NEGATIVE NEGATIVE mg/dL   Hgb urine dipstick LARGE (A) NEGATIVE   Bilirubin Urine NEGATIVE NEGATIVE   Ketones, ur NEGATIVE NEGATIVE mg/dL   Protein, ur NEGATIVE NEGATIVE mg/dL   Nitrite NEGATIVE NEGATIVE   Leukocytes,Ua SMALL (A) NEGATIVE   RBC / HPF 0-5 0 - 5 RBC/hpf   WBC, UA 0-5 0 - 5 WBC/hpf   Bacteria, UA NONE SEEN NONE SEEN  POCT fern test     Status: None   Collection Time: 06/18/18 12:41 PM  Result Value Ref Range   POCT Fern Test Negative = intact amniotic membranes     Imaging:  No results found.  MAU Course: Orders Placed This Encounter  Procedures  . Culture, OB Urine  . Urinalysis, Routine w reflex microscopic  . POCT fern test  . Discharge patient   No orders of the defined types were placed in this encounter.   MDM: Reactive NST. Active movement through monitor. Patient reports return of normal fetal movement.   SSE performed. No pooling of fluid. Fern negative.   Irregular UI on monitor. No ctx palpated. Cervix closed/thick. U/a with large hemoglobin & small leuks. Reports no changes in urinary symptoms. Will send urine for culture.   Assessment: 1. Encounter for suspected PROM, with rupture of membranes not found   2. Abdominal cramping affecting pregnancy   3. [redacted] weeks gestation of pregnancy   4. Decreased fetal movements in third trimester, single or unspecified fetus   5. NST (non-stress test) reactive     Plan: Discharge home in stable condition.  Preterm Labor precautions and fetal kick counts Urine culture pending  Allergies as of 06/18/2018      Reactions   Alcohol Hives   Allergic to alcohol  containing products including cough medicines that contain alcohol   Flagyl [metronidazole] Other (See Comments)   Facial flushing   Hydrocodone-acetaminophen Nausea And Vomiting      Medication List    TAKE these medications   acetaminophen 500 MG tablet Commonly known as:  TYLENOL Take 1,000 mg by mouth daily as needed for moderate pain.   cephALEXin 500 MG capsule Commonly known as:  KEFLEX Take 1 capsule (500 mg total) by mouth daily.   PRENATAL VITAMIN PO Take by mouth.   Specimen Catheter Female Misc 1 Device by Does not apply route 4 (four) times daily. Pt is to use catheter QID for self cath   tamsulosin 0.4 MG Caps capsule Commonly known as:  FLOMAX Take 1 capsule (0.4 mg total) by mouth daily.   traMADol 50 MG tablet Commonly known as:  ULTRAM Take 1 tablet (50 mg total) by mouth every 6 (six) hours as needed. What changed:  Another medication with the same name was removed. Continue taking this medication, and follow the directions you see here.       Judeth Horn, NP 06/18/2018 1:10 PM

## 2018-06-18 NOTE — MAU Note (Signed)
Gabrielle Roy is a 28 y.o. at [redacted]w[redacted]d here in MAU reporting: sharp vaginal pain since last night, contractions started this AM. Reports she leaked some clear fluid last night, states she leaked some this morning also. Reports she has felt one movement today, about an hour ago.  Onset of complaint: last night  Pain score: 8/10  Vitals:   06/18/18 1151  BP: 118/70  Pulse: 77  Resp: 18  Temp: 97.7 F (36.5 C)  SpO2: 100%      FHT:140  Lab orders placed from triage: UA

## 2018-06-19 LAB — CULTURE, OB URINE: Culture: <10000 — AB

## 2018-06-21 ENCOUNTER — Encounter: Payer: Self-pay | Admitting: *Deleted

## 2018-06-24 ENCOUNTER — Inpatient Hospital Stay (HOSPITAL_COMMUNITY)
Admission: AD | Admit: 2018-06-24 | Discharge: 2018-06-24 | Disposition: A | Discharge status: Home / Self Care | Source: Ambulatory Visit | Attending: Obstetrics and Gynecology | Admitting: Obstetrics and Gynecology

## 2018-06-24 ENCOUNTER — Other Ambulatory Visit: Payer: Self-pay

## 2018-06-24 ENCOUNTER — Encounter (HOSPITAL_COMMUNITY): Payer: Self-pay | Admitting: *Deleted

## 2018-06-24 DIAGNOSIS — R109 Unspecified abdominal pain: Secondary | ICD-10-CM

## 2018-06-24 DIAGNOSIS — O99843 Bariatric surgery status complicating pregnancy, third trimester: Secondary | ICD-10-CM | POA: Insufficient documentation

## 2018-06-24 DIAGNOSIS — O4703 False labor before 37 completed weeks of gestation, third trimester: Secondary | ICD-10-CM

## 2018-06-24 DIAGNOSIS — Z8249 Family history of ischemic heart disease and other diseases of the circulatory system: Secondary | ICD-10-CM | POA: Insufficient documentation

## 2018-06-24 DIAGNOSIS — M549 Dorsalgia, unspecified: Secondary | ICD-10-CM

## 2018-06-24 DIAGNOSIS — Z3A36 36 weeks gestation of pregnancy: Secondary | ICD-10-CM | POA: Diagnosis not present

## 2018-06-24 DIAGNOSIS — Z8632 Personal history of gestational diabetes: Secondary | ICD-10-CM | POA: Diagnosis not present

## 2018-06-24 DIAGNOSIS — N200 Calculus of kidney: Secondary | ICD-10-CM | POA: Diagnosis not present

## 2018-06-24 DIAGNOSIS — O34219 Maternal care for unspecified type scar from previous cesarean delivery: Secondary | ICD-10-CM | POA: Diagnosis not present

## 2018-06-24 DIAGNOSIS — O99891 Other specified diseases and conditions complicating pregnancy: Secondary | ICD-10-CM

## 2018-06-24 DIAGNOSIS — Z8759 Personal history of other complications of pregnancy, childbirth and the puerperium: Secondary | ICD-10-CM

## 2018-06-24 DIAGNOSIS — Z87891 Personal history of nicotine dependence: Secondary | ICD-10-CM | POA: Diagnosis not present

## 2018-06-24 DIAGNOSIS — O26893 Other specified pregnancy related conditions, third trimester: Secondary | ICD-10-CM

## 2018-06-24 DIAGNOSIS — O9989 Other specified diseases and conditions complicating pregnancy, childbirth and the puerperium: Secondary | ICD-10-CM | POA: Insufficient documentation

## 2018-06-24 DIAGNOSIS — Z885 Allergy status to narcotic agent status: Secondary | ICD-10-CM | POA: Diagnosis not present

## 2018-06-24 DIAGNOSIS — O26833 Pregnancy related renal disease, third trimester: Secondary | ICD-10-CM | POA: Diagnosis not present

## 2018-06-24 DIAGNOSIS — Z9109 Other allergy status, other than to drugs and biological substances: Secondary | ICD-10-CM | POA: Insufficient documentation

## 2018-06-24 DIAGNOSIS — Z881 Allergy status to other antibiotic agents status: Secondary | ICD-10-CM | POA: Diagnosis not present

## 2018-06-24 DIAGNOSIS — R339 Retention of urine, unspecified: Secondary | ICD-10-CM | POA: Insufficient documentation

## 2018-06-24 DIAGNOSIS — S334XXA Traumatic rupture of symphysis pubis, initial encounter: Secondary | ICD-10-CM

## 2018-06-24 LAB — URINALYSIS, MICROSCOPIC (REFLEX)

## 2018-06-24 LAB — URINALYSIS, ROUTINE W REFLEX MICROSCOPIC
Glucose, UA: NEGATIVE mg/dL
Ketones, ur: 15 mg/dL — AB
Nitrite: NEGATIVE
Protein, ur: >300 mg/dL — AB
Specific Gravity, Urine: 1.025 (ref 1.005–1.030)
pH: 6 (ref 5.0–8.0)

## 2018-06-24 MED ORDER — CEPHALEXIN 500 MG PO CAPS: 500.0000 mg | ORAL_CAPSULE | Freq: Four times a day (QID) | ORAL | 0 refills | Status: AC

## 2018-06-24 MED ORDER — OXYCODONE-ACETAMINOPHEN 5-325 MG PO TABS: 1.0000 | ORAL_TABLET | Freq: Four times a day (QID) | ORAL | 0 refills | Status: DC | PRN

## 2018-06-24 MED ORDER — PHENAZOPYRIDINE HCL 100 MG PO TABS
200.0000 mg | ORAL_TABLET | Freq: Once | ORAL | Status: AC
  Administered 2018-06-24: 200 mg via ORAL
  Filled 2018-06-24: qty 2

## 2018-06-24 MED ORDER — PHENAZOPYRIDINE HCL 200 MG PO TABS: 200.0000 mg | ORAL_TABLET | Freq: Three times a day (TID) | ORAL | 0 refills | Status: DC | PRN

## 2018-06-24 MED ORDER — TAMSULOSIN HCL 0.4 MG PO CAPS
0.4000 mg | ORAL_CAPSULE | Freq: Once | ORAL | Status: AC
  Administered 2018-06-24: 21:00:00 0.4 mg via ORAL
  Filled 2018-06-24: qty 1

## 2018-06-24 NOTE — MAU Note (Signed)
For Repeat C/S.  Having contractions every 5 min.  Left kidney pain, 'Can't pee at noon' 'just dribbles'  Has a stent in. No bleeding, baby moving well. No leaking.

## 2018-06-24 NOTE — MAU Note (Signed)
INSERTED FOLEY CATH- DRAINED 50CC- IRRIGATED WITH LISA, CNM -   AT 949PM- REMOVED FOLEY- FOR D/C HOME

## 2018-06-24 NOTE — MAU Provider Note (Signed)
Chief Complaint:  Contractions   First Provider Initiated Contact with Patient 06/24/18 2016      HPI: Gabrielle Roy is a 28 y.o. G3P1102 at [redacted]w[redacted]d by early ultrasound who presents to maternity admissions reporting abdominal pain and urinary retention. She has a sent placed for renal calculi, placed at 27 weeks of pregnancy, and has had some pain in her kidneys and with urination but never urinary retention this severe. She urinating this morning then could not go a few hours later.  She has I&O catheters at home and tried to drain her bladder but did not get any return.  She has back pain associated with her stent that is worse with the urinary pain and intermittent contractions in her low abdomen today as well. There are no other symptoms. She has tried Tramadol, 1 tablet which did not help and is taking Flomax and daily prophylactic Keflex.   She reports good fetal movement, denies LOF, vaginal bleeding, vaginal itching/burning, or fever/chills.    HPI  Past Medical History: Past Medical History:  Diagnosis Date  . Common migraine with intractable migraine 03/10/2018  . Kidney stone    Stent placed in Feb 2017  . PONV (postoperative nausea and vomiting)    nausea during last c section  . Supervision of normal pregnancy, antepartum 10/04/2015    Clinic  [facility] - transfer from Dr. Shawnie Pons Prenatal Labs Dating  Blood type:    Genetic Screen 1 Screen:    AFP:     Quad:     NIPS: Antibody:  Anatomic [place]  Nml fetal anatomy @ 18 wks; low lying placenta > resolved  wks Rubella:   GTT Early:               Third trimester:  RPR:    Flu vaccine  Declined HBsAg:    TDaP vaccine                                               Rhogam: HIV:    Baby Food  Breastfeed                                            GBS: (For PCN allergy, check sensitivities) Contraception  Pap: Circumcision  n/a female ([facility] ultrasound)  Pediatrician   Support Person  [name] (husband)      Past obstetric history: OB History   Gravida Para Term Preterm AB Living  SAB TAB Ectopic Multiple Live Births        0 2    # Outcome Date GA Lbr Len/2nd Weight Sex Delivery Anes PTL Lv  3 Current           2 Term 02/20/16 [redacted]w[redacted]d  4040 g F CS-LTranv Spinal  LIV  1 Preterm 07/20/12 [redacted]w[redacted]d  2268 g F CS-LTranv  N LIV     Complications: Fetal Intolerance, Oligohydramnios antepartum    Past Surgical History: Past Surgical History:  Procedure Laterality Date  . CESAREAN SECTION    . CESAREAN SECTION N/A 02/20/2016   Procedure: REPEAT CESAREAN SECTION;  Surgeon: Hermina Staggers, MD;  Location: [facility] BIRTHING SUITES;  Service: Obstetrics;  Laterality: N/A;  . CYSTOSCOPY W/ URETERAL STENT  PLACEMENT Left 04/26/2018   Procedure: CYSTOSCOPY WITH RETROGRADE PYELOGRAM/URETERAL STENT PLACEMENT;  Surgeon: Crist Fat, MD;  Location: [facility] ORS;  Service: Urology;  Laterality: Left;  . FINGER SURGERY    . GASTRIC BYPASS      Family History: Family History  Problem Relation Age of Onset  . Hypertension Father   . Heart disease Father   . Hearing loss Father     Social History: Social History   Tobacco Use  . Smoking status: Former Games developer  . Smokeless tobacco: Never Used  Substance Use Topics  . Alcohol use: No  . Drug use: No    Allergies:  Allergies  Allergen Reactions  . Alcohol Hives    Allergic to alcohol containing products including cough medicines that contain alcohol  . Flagyl [Metronidazole] Other (See Comments)    Facial flushing  . Hydrocodone-Acetaminophen Nausea And Vomiting    Meds:  No medications prior to admission.    ROS:  Review of Systems  Constitutional: Negative for chills, fatigue and fever.  Eyes: Negative for visual disturbance.  Respiratory: Negative for shortness of breath.   Cardiovascular: Negative for chest pain.  Gastrointestinal: Positive for abdominal pain. Negative for nausea and vomiting.  Genitourinary: Positive for difficulty urinating, dysuria and  hematuria. Negative for flank pain, pelvic pain, vaginal bleeding, vaginal discharge and vaginal pain.  Musculoskeletal: Positive for back pain.  Neurological: Negative for dizziness and headaches.  Psychiatric/Behavioral: Negative.      I have reviewed patient's Past Medical Hx, Surgical Hx, Family Hx, Social Hx, medications and allergies.   Physical Exam   Patient Vitals for the past 24 hrs:  BP Temp src Pulse Resp  06/24/18 2149 113/67 - 63 -  06/24/18 1931 116/70 Oral 72 (!) 98   Constitutional: Well-developed, well-nourished female in no acute distress.  Cardiovascular: normal rate Respiratory: normal effort GI: Abd soft, non-tender, gravid appropriate for gestational age.  MS: Extremities nontender, no edema, normal ROM Neurologic: Alert and oriented x 4.  GU: Neg CVAT.    Dilation: Closed Exam by:: DR Emelda Fear  FHT:  Baseline 125 , moderate variability, accelerations present, no decelerations Contractions: irritability every 1-5 minutes, mild to palpation   Labs: Results for orders placed or performed during the hospital encounter of 06/24/18 (from the past 24 hour(s))  Urinalysis, Routine w reflex microscopic     Status: Abnormal   Collection Time: 06/24/18  7:51 PM  Result Value Ref Range   Color, Urine YELLOW YELLOW   APPearance CLEAR CLEAR   Specific Gravity, Urine 1.025 1.005 - 1.030   pH 6.0 5.0 - 8.0   Glucose, UA NEGATIVE NEGATIVE mg/dL   Hgb urine dipstick LARGE (A) NEGATIVE   Bilirubin Urine SMALL (A) NEGATIVE   Ketones, ur 15 (A) NEGATIVE mg/dL   Protein, ur >272 (A) NEGATIVE mg/dL   Nitrite NEGATIVE NEGATIVE   Leukocytes,Ua SMALL (A) NEGATIVE  Urinalysis, Microscopic (reflex)     Status: Abnormal   Collection Time: 06/24/18  7:51 PM  Result Value Ref Range   RBC / HPF 21-50 0 - 5 RBC/hpf   WBC, UA 6-10 0 - 5 WBC/hpf   Bacteria, UA FEW (A) NONE SEEN   Squamous Epithelial / LPF 0-5 0 - 5   Mucus PRESENT    Hyaline Casts, UA PRESENT    Ca  Oxalate Crys, UA PRESENT    O/RH(D) POSITIVE/-- (10/23 1416)  Imaging:    MAU Course/MDM: Orders Placed This Encounter  Procedures  . Culture,  OB Urine  . Urinalysis, Routine w reflex microscopic  . Urinalysis, Microscopic (reflex)  . In and Out Cath  . Discharge patient    Meds ordered this encounter  Medications  . tamsulosin (FLOMAX) capsule 0.4 mg  . phenazopyridine (PYRIDIUM) tablet 200 mg  . phenazopyridine (PYRIDIUM) 200 MG tablet    Sig: Take 1 tablet (200 mg total) by mouth 3 (three) times daily as needed for pain.    Dispense:  12 tablet    Refill:  0    Order Specific Question:   Supervising Provider    Answer:   ERVIN, MICHAEL L [[id number]]  . cephALEXin (KEFLEX) 500 MG capsule    Sig: Take 1 capsule (500 mg total) by mouth 4 (four) times daily for 7 days.    Dispense:  28 capsule    Refill:  0    Order Specific Question:   Supervising Provider    Answer:   ERVIN, MICHAEL L [[id number]]  . oxyCODONE-acetaminophen (PERCOCET/ROXICET) 5-325 MG tablet    Sig: Take 1 tablet by mouth every 6 (six) hours as needed for severe pain.    Dispense:  20 tablet    Refill:  0    Order Specific Question:   Supervising Provider    Answer:   Alysia Penna, MICHAEL L [[id number]]     NST reviewed and reactive Cervix closed and remained closed on reexam in 1+ hour in MAU.   Initially, I&O catheter with ~50 ml return only of pink tinged urine.  Bladder scan with equivocal results.  Consult Dr Emelda Fear who suggested irrigating bladder using port on foley catheter. Foley inserted then pt reported extreme pain with filling of 10 ml bulb.  Bladder irrigated with 60 ml sterile water which was removed and light yellow with urine, no bleeding noted.  Foley bulb emptied, Dr Emelda Fear to bedside to evaluate. Bulb filled to 5 ml only and bladder irrigated with 60 ml sterile water again, with similar results.  Total of 50 ml of clear yellow and sometimes pink tinged urine in foley bag, plus 2 syringes of 60 ml water  plus urine.  Bladder US by Dr Emelda Fear reveals empty bladder so no retention noted.    Pain is likely from stent and fetal head pressing on bladder/stent, foley bulb exacerbated this pain.  No evidence of preterm labor, no acute abdomen or other surgical emergencies.    Will treat with Percocet, Pyridium, and increase Keflex to QID for 7 days.  Pt to f/u on Monday with Dr Marice Potter as planned.  Return to MAU with signs of labor or emergencies.    Pt discharge with strict return precautions.    Assessment: 1. Threatened premature labor in third trimester   2. Symphysis pubis disruption, initial encounter   3. Back pain in pregnancy   4. History of gestational hypertension   5. Previous cesarean section complicating pregnancy, antepartum condition or complication   6. Renal stones   7. Abdominal pain during pregnancy in third trimester   8. Urinary retention     Plan: Discharge home Labor precautions and fetal kick counts  Follow-up Information    Center for [facility] Follow up.   Specialty:  Obstetrics and Gynecology Why:  On Monday, 4/20 as scheduled. Return to MAU as needed for signs of labor or emergencies.   [phone]. Contact information: [address] [place] [address] [phone]         Allergies as of 06/24/2018  Reactions   Alcohol Hives   Allergic to alcohol containing products including cough medicines that contain alcohol   Flagyl [metronidazole] Other (See Comments)   Facial flushing   Hydrocodone-acetaminophen Nausea And Vomiting      Medication List    STOP taking these medications   traMADol 50 MG tablet Commonly known as:  ULTRAM     TAKE these medications   acetaminophen 500 MG tablet Commonly known as:  TYLENOL Take 1,000 mg by mouth daily as needed for moderate pain.   cephALEXin 500 MG capsule Commonly known as:  KEFLEX Take 1 capsule (500 mg total) by mouth 4 (four) times daily  for 7 days. What changed:  when to take this   oxyCODONE-acetaminophen 5-325 MG tablet Commonly known as:  PERCOCET/ROXICET Take 1 tablet by mouth every 6 (six) hours as needed for severe pain.   phenazopyridine 200 MG tablet Commonly known as:  PYRIDIUM Take 1 tablet (200 mg total) by mouth 3 (three) times daily as needed for pain.   PRENATAL VITAMIN PO Take by mouth.   Specimen Catheter Female Misc 1 Device by Does not apply route 4 (four) times daily. Pt is to use catheter QID for self cath   tamsulosin 0.4 MG Caps capsule Commonly known as:  FLOMAX Take 1 capsule (0.4 mg total) by mouth daily.       Sharen CounterLisa Leftwich-Kirby Certified Nurse-Midwife 06/24/2018 10:25 PM

## 2018-06-26 LAB — CULTURE, OB URINE: Culture: NO GROWTH

## 2018-06-27 ENCOUNTER — Other Ambulatory Visit: Payer: Self-pay

## 2018-06-27 ENCOUNTER — Ambulatory Visit (INDEPENDENT_AMBULATORY_CARE_PROVIDER_SITE_OTHER): Admitting: Obstetrics & Gynecology

## 2018-06-27 VITALS — BP 107/67 | HR 84 | Temp 97.8°F | Wt 202.0 lb

## 2018-06-27 DIAGNOSIS — O34219 Maternal care for unspecified type scar from previous cesarean delivery: Secondary | ICD-10-CM

## 2018-06-27 DIAGNOSIS — Z9884 Bariatric surgery status: Secondary | ICD-10-CM

## 2018-06-27 DIAGNOSIS — R339 Retention of urine, unspecified: Secondary | ICD-10-CM

## 2018-06-27 DIAGNOSIS — Z3480 Encounter for supervision of other normal pregnancy, unspecified trimester: Secondary | ICD-10-CM

## 2018-06-27 DIAGNOSIS — Z3483 Encounter for supervision of other normal pregnancy, third trimester: Secondary | ICD-10-CM

## 2018-06-27 DIAGNOSIS — Z113 Encounter for screening for infections with a predominantly sexual mode of transmission: Secondary | ICD-10-CM | POA: Diagnosis not present

## 2018-06-27 DIAGNOSIS — Z3A36 36 weeks gestation of pregnancy: Secondary | ICD-10-CM

## 2018-06-27 NOTE — Progress Notes (Signed)
   PRENATAL VISIT NOTE  Subjective:  Gabrielle Roy is a 28 y.o. G3P1102 at [redacted]w[redacted]d being seen today for ongoing prenatal care.  She is currently monitored for the following issues for this high-risk pregnancy and has Supervision of normal pregnancy; Anxiety state; Obesity in pregnancy; Depressive disorder, not elsewhere classified; History of gestational hypertension; Gastric bypass status for obesity; Previous cesarean section complicating pregnancy, antepartum condition or complication; Common migraine with intractable migraine; Back pain in pregnancy; Symphysis pubis disruption, initial encounter; Renal stones; Urinary urgency; Threatened premature labor in third trimester; Former smoker; Gastroesophageal reflux disease without esophagitis; Postoperative intestinal malabsorption; S/P laparoscopic sleeve gastrectomy; History of preterm delivery; and Late Entry into [facility] opt schedule to limit social distancing due to COVID-19 on their problem list.  Patient reports She still has left kidney pain. She has a left stent in place, is followed by [facility].  Contractions: Irritability. Vag. Bleeding: None.  Movement: Present. Denies leaking of fluid.   The following portions of the patient's history were reviewed and updated as appropriate: allergies, current medications, past family history, past medical history, past social history, past surgical history and problem list.   Objective:   Vitals:   06/27/18 1302  BP: 107/67  Pulse: 84  Temp: 97.8 F (36.6 C)  Weight: 202 lb (91.6 kg)    Fetal Status: Fetal Heart Rate (bpm): 141   Movement: Present     General:  Alert, oriented and cooperative. Patient is in no acute distress.  Skin: Skin is warm and dry. No rash noted.   Cardiovascular: Normal heart rate noted  Respiratory: Normal respiratory effort, no problems with respiration noted  Abdomen: Soft, gravid, appropriate for gestational age.  Pain/Pressure: Present     Pelvic: Cervical  exam performed        Extremities: Normal range of motion.  Edema: None  Mental Status: Normal mood and affect. Normal behavior. Normal judgment and thought content.   Assessment and Plan:  Pregnancy: G3P1102 at [redacted]w[redacted]d 1. Previous cesarean section complicating pregnancy, antepartum condition or complication - She would like her RLTCS asap. I have explained our 39 week policy based on ACOG recs. I have offered her a second opinion with MFM asap  2. Gastric bypass status for obesity  - Comprehensive metabolic panel - T03 - Vitamin D (25 hydroxy) - Vitamin B1 - Iron - Ferritin - UDS  3. Encounter for supervision of other normal pregnancy in third trimester - cervical cultures today  4. Urinary retention  - CBC - AMB referral to maternal fetal medicine  Preterm labor symptoms and general obstetric precautions including but not limited to vaginal bleeding, contractions, leaking of fluid and fetal movement were reviewed in detail with the patient. Please refer to After Visit Summary for other counseling recommendations.   No follow-ups on file.  Future Appointments  Date Time Provider Department Center  07/04/2018 11:00 AM Allie Bossier, MD [facility]    Allie Bossier, MD

## 2018-06-28 ENCOUNTER — Telehealth: Payer: Self-pay | Admitting: *Deleted

## 2018-06-28 ENCOUNTER — Other Ambulatory Visit: Payer: Self-pay | Admitting: Certified Nurse Midwife

## 2018-06-28 LAB — URINE CYTOLOGY ANCILLARY ONLY
Chlamydia: NEGATIVE
Neisseria Gonorrhea: NEGATIVE

## 2018-06-28 NOTE — Telephone Encounter (Signed)
Pt called with ongoing urinary retention for which she has a stent due to a bladder stone.  She has been to the MAU on several occasions with pain and also sees [facility].  She does doe self caths after urination.  She has called today stated that she has self cathed twice today and has not gotten any urine.  She stated that she has drank 2 power ades and water and "I can't pee"  She is wanted to know if she should go to MAU.  I suggested that she call the urologist and what their recommendations are.  She agrees that she will call them.

## 2018-06-29 ENCOUNTER — Encounter (HOSPITAL_COMMUNITY): Payer: Self-pay | Admitting: Obstetrics and Gynecology

## 2018-06-29 ENCOUNTER — Other Ambulatory Visit: Payer: Self-pay

## 2018-06-29 ENCOUNTER — Ambulatory Visit (HOSPITAL_COMMUNITY): Attending: Obstetrics and Gynecology | Admitting: Obstetrics and Gynecology

## 2018-06-29 DIAGNOSIS — Z3A36 36 weeks gestation of pregnancy: Secondary | ICD-10-CM | POA: Diagnosis not present

## 2018-06-29 DIAGNOSIS — N209 Urinary calculus, unspecified: Secondary | ICD-10-CM

## 2018-06-29 DIAGNOSIS — O0990 Supervision of high risk pregnancy, unspecified, unspecified trimester: Secondary | ICD-10-CM

## 2018-06-29 DIAGNOSIS — O0993 Supervision of high risk pregnancy, unspecified, third trimester: Secondary | ICD-10-CM

## 2018-06-29 NOTE — Consult Note (Signed)
Maternal-Fetal Medicine (Tele-Video-Consultation)  Name: Gabrielle Roy MRN: [id number] Requesting Provider: Nicholaus BloomMyra Dove, MD  I had the pleasure of seeing Gabrielle Roy today for consultation to address timing of delivery. Past medical history is significant for left ureteral calculus at the UPJ (10 millimeters) that led to intermittent severe back pain. Patient was seen at the MAU in January (23 weeks) for flank and pelvic pain and then on a couple of occasions before she was evaluated by a urologist.  She was taken to the OR on 04/26/2018 and had left ureteral stent placement. I do not see any mention of calculi, but the patient informed that she was recommended surgery after delivery. Patient has been having several episodes of flank and lower abdominal pain and made frequent trips to MAU.  She reports she feels miserable because of inadequate voiding and has to self-catheterize several times daily to void. She has not had urine infections and she takes prophylactic cephalexin daily. Patient also requires pain killers to alleviate her symptoms.  She does not have hypertension or diabetes. She had gastric by-pass (sleeve) surgery in 2019 and lost about 100 lbs after surgery.  PSH: Gastric by-pass, cesarean sections (2), ureteric stent placement in 2017 and in 2020, left finger surgery. Medications: Keflex prophylaxis, Tylenol, Percocet, Flomax, pyridium, prenatal vitamins, Flomax. Allergies: Flagyl and hydrocodone ("rashes"). Social: Denies tobacco or drug or alcohol use. She is married and her husband (father of all her children) is in good health. He is Caucasian. Family: No history of venous thromboembolism in the family.  Obstetric history is significant for 2 previous cesarean deliveries (2014 and 2017) and both her children are in good health.  Recent labs: Urine culture: no growth. Hb 11.5, Hct 32.6, WBC 9.1, PLT 255, creatinine 0.61, normal liver enzymes, Urine Hb increased.  Ultrasound  performed on 06/10/18 showed an appropriately-grown fetus. No evidence of placenta previa or accreta.  I counseled the patient on the following:  Urinary symptoms and timing of delivery: Patient is having intermittent severe flank and lower abdominal pain so often that she needs to go to the emergency (MAU) for relief. She was informed by one of the physicians that fetal head could be compressing the stent and cause pain. I am not certain that the cause of her pain is from head compression.  Patient takes several analgesics and some of them are not well studied in pregnancy (Flomax). I informed her that decision on timing of delivery should take into account of benefits versus risks of early term delivery (37 to 39 weeks). Delivery at 37 or 38 weeks as opposed to 39 weeks slightly increases neonatal complications including respiratory-distress syndrome, prolonged NICU stay and NICU readmissions. However, most infants born at 2037 or 38 weeks are discharged with the mother.   Gabrielle Roy sought emergency care on several occasions. Prolonging her pregnancy is likely to lead complications of urinary tract infections including pyeleonephritis, fetal effects of multiple medications including analgesics and overall compromised emotional wellbeing of the mother.  It is possible that delivery may not alleviate her symptoms, but could prevent complications arising in pregnancy.   It may be too late in gestation to undergo surgery for removal of calculi and the best time would have been in the second trimester.  It is reasonable to deliver at 38 weeks to optimize good neonatal outcome.  Patient understands the slight risk for neonatal complications of delivery at 38 weeks and agreed with my recommendation.  If patient returns to MAU/ER with recurrent  severe pain, delivery may be considered after 37 weeks.  During consultation, I also discussed repeat cesarean deliveries and increased likelihood of placenta  previa or accreta in subsequent pregnancies.  Consultation including face-to-face counseling: 40 min. The service was provided via telemedicine. Locaton of patient: Home.

## 2018-06-30 ENCOUNTER — Encounter (HOSPITAL_COMMUNITY): Payer: Self-pay

## 2018-06-30 LAB — PAIN MGMT, PROFILE 6 CONF W/O MM, U
6 Acetylmorphine: NEGATIVE ng/mL
Alcohol Metabolites: NEGATIVE ng/mL (ref <500)
Amphetamines: NEGATIVE ng/mL
Barbiturates: NEGATIVE ng/mL
Benzodiazepines: NEGATIVE ng/mL
Cocaine Metabolite: NEGATIVE ng/mL
Creatinine: 49 mg/dL
Marijuana Metabolite: NEGATIVE ng/mL
Methadone Metabolite: NEGATIVE ng/mL
Opiates: NEGATIVE ng/mL
Oxidant: NEGATIVE ug/mL
Oxycodone: NEGATIVE ng/mL
Phencyclidine: NEGATIVE ng/mL
pH: 7.6 (ref 4.5–9.0)

## 2018-06-30 LAB — CULTURE, BETA STREP (GROUP B ONLY)
MICRO NUMBER:: 407186
SPECIMEN QUALITY:: ADEQUATE

## 2018-06-30 LAB — COMPREHENSIVE METABOLIC PANEL
AG Ratio: 1.2 (calc) (ref 1.0–2.5)
ALT: 9 U/L (ref 6–29)
AST: 14 U/L (ref 10–30)
Albumin: 3 g/dL — ABNORMAL LOW (ref 3.6–5.1)
Alkaline phosphatase (APISO): 127 U/L — ABNORMAL HIGH (ref 31–125)
BUN: 7 mg/dL (ref 7–25)
CO2: 24 mmol/L (ref 20–32)
Calcium: 8.4 mg/dL — ABNORMAL LOW (ref 8.6–10.2)
Chloride: 105 mmol/L (ref 98–110)
Creat: 0.61 mg/dL (ref 0.50–1.10)
Globulin: 2.5 g/dL (calc) (ref 1.9–3.7)
Glucose, Bld: 76 mg/dL (ref 65–99)
Potassium: 4.1 mmol/L (ref 3.5–5.3)
Sodium: 135 mmol/L (ref 135–146)
Total Bilirubin: 0.7 mg/dL (ref 0.2–1.2)
Total Protein: 5.5 g/dL — ABNORMAL LOW (ref 6.1–8.1)

## 2018-06-30 LAB — CBC
HCT: 32.6 % — ABNORMAL LOW (ref 35.0–45.0)
Hemoglobin: 11.5 g/dL — ABNORMAL LOW (ref 11.7–15.5)
MCH: 34 pg — ABNORMAL HIGH (ref 27.0–33.0)
MCHC: 35.3 g/dL (ref 32.0–36.0)
MCV: 96.4 fL (ref 80.0–100.0)
MPV: 10.5 fL (ref 7.5–12.5)
Platelets: 255 10*3/uL (ref 140–400)
RBC: 3.38 10*6/uL — ABNORMAL LOW (ref 3.80–5.10)
RDW: 12.5 % (ref 11.0–15.0)
WBC: 9.1 10*3/uL (ref 3.8–10.8)

## 2018-06-30 LAB — FERRITIN: Ferritin: 21 ng/mL (ref 16–154)

## 2018-06-30 LAB — VITAMIN B12: Vitamin B-12: 160 pg/mL — ABNORMAL LOW (ref 200–1100)

## 2018-06-30 LAB — VITAMIN D 25 HYDROXY (VIT D DEFICIENCY, FRACTURES): Vit D, 25-Hydroxy: 24 ng/mL — ABNORMAL LOW (ref 30–100)

## 2018-06-30 LAB — IRON: Iron: 93 ug/dL (ref 40–190)

## 2018-06-30 LAB — VITAMIN B1: Vitamin B1 (Thiamine): <6 nmol/L — ABNORMAL LOW (ref 8–30)

## 2018-06-30 NOTE — Patient Instructions (Signed)
Gabrielle Roy  06/30/2018   Your procedure is scheduled on:  07/08/2018  Arrive at 0730 at [employer] [facility] on [address] at [facility]  and [employer]. You are invited to use the FREE valet parking or use the Visitor's parking deck.  Pick up the phone at the desk and dial [phone].  Call this number if you have problems the morning of surgery: [phone]  Remember:   Do not eat food:(After Midnight) Desps de medianoche.  Do not drink clear liquids: (After Midnight) Desps de medianoche.  Take these medicines the morning of surgery with A SIP OF WATER:  pyridium   Do not wear jewelry, make-up or nail polish.  Do not wear lotions, powders, or perfumes. Do not wear deodorant.  Do not shave 48 hours prior to surgery.  Do not bring valuables to the hospital.  [facility] is not   responsible for any belongings or valuables brought to the hospital.  Contacts, dentures or bridgework may not be worn into surgery.  Leave suitcase in the car. After surgery it may be brought to your room.  For patients admitted to the hospital, checkout time is 11:00 AM the day of              discharge.      Please read over the following fact sheets that you were given:     Preparing for Surgery

## 2018-07-01 ENCOUNTER — Telehealth: Payer: Self-pay

## 2018-07-01 NOTE — Telephone Encounter (Signed)
Patient called saying she had a gush of yellow tinged fluid. Patient states that she is [redacted] weeks pregnant. Patient states that she does deal with some urinary issues but has never had loss of control of her bladder like that. Patient made aware that if she is continuing to have leaking she should go to [facility] at [employer] for evaluation. Armandina Stammer RN

## 2018-07-02 ENCOUNTER — Other Ambulatory Visit: Payer: Self-pay | Admitting: Family Medicine

## 2018-07-02 ENCOUNTER — Inpatient Hospital Stay (HOSPITAL_COMMUNITY)
Admission: AD | Admit: 2018-07-02 | Discharge: 2018-07-02 | Disposition: A | Discharge status: Home / Self Care | Attending: Obstetrics and Gynecology | Admitting: Obstetrics and Gynecology

## 2018-07-02 ENCOUNTER — Other Ambulatory Visit: Payer: Self-pay

## 2018-07-02 DIAGNOSIS — Z3A37 37 weeks gestation of pregnancy: Secondary | ICD-10-CM | POA: Insufficient documentation

## 2018-07-02 DIAGNOSIS — Z8759 Personal history of other complications of pregnancy, childbirth and the puerperium: Secondary | ICD-10-CM

## 2018-07-02 DIAGNOSIS — O9989 Other specified diseases and conditions complicating pregnancy, childbirth and the puerperium: Secondary | ICD-10-CM

## 2018-07-02 DIAGNOSIS — O34219 Maternal care for unspecified type scar from previous cesarean delivery: Secondary | ICD-10-CM | POA: Insufficient documentation

## 2018-07-02 DIAGNOSIS — O4290 Premature rupture of membranes, unspecified as to length of time between rupture and onset of labor, unspecified weeks of gestation: Secondary | ICD-10-CM | POA: Insufficient documentation

## 2018-07-02 DIAGNOSIS — O4693 Antepartum hemorrhage, unspecified, third trimester: Secondary | ICD-10-CM | POA: Diagnosis not present

## 2018-07-02 DIAGNOSIS — S334XXA Traumatic rupture of symphysis pubis, initial encounter: Secondary | ICD-10-CM

## 2018-07-02 DIAGNOSIS — Z3689 Encounter for other specified antenatal screening: Secondary | ICD-10-CM | POA: Diagnosis not present

## 2018-07-02 DIAGNOSIS — M549 Dorsalgia, unspecified: Secondary | ICD-10-CM

## 2018-07-02 DIAGNOSIS — Z0371 Encounter for suspected problem with amniotic cavity and membrane ruled out: Secondary | ICD-10-CM

## 2018-07-02 LAB — AMNISURE RUPTURE OF MEMBRANE (ROM) NOT AT ARMC: Amnisure ROM: NEGATIVE

## 2018-07-02 NOTE — MAU Provider Note (Addendum)
Chief Complaint  Patient presents with  . Rupture of Membranes     First Provider Initiated Contact with Patient 07/02/18 2116      S: Gabrielle Roy  is a 28 y.o. y.o. year old G32P1102 female at [redacted]w[redacted]d weeks gestation who presents to MAU reporting leaking of clear-yellow fluid since yesterday. Leaked small amount once yesterday and moderate amount this evening.  Previous C/S x 2. Plans repeat.   Contractions: Denies Vaginal bleeding: Scant bloody show this morning.  Fetal movement: Nml  O:  Patient Vitals for the past 24 hrs:  BP Temp Pulse Resp Height Weight  07/02/18 2104 113/65 98.3 F (36.8 C) 77 18 5\' 1"  (1.549 m) 90.1 kg   General: NAD Heart: Regular rate Lungs: Normal rate and effort Abd: Soft, NT, Gravid, S=D Pelvic: NEFG, Neg pooling, no blood.   Visually closed.   EFM: 125, Moderate variability, 15 x 15 accelerations, no decelerations Toco: UI  Neg Fern  Amnisure ordered.  Care of pt turned over to [place], [place], [place], [place] 07/02/2018 9:43 PM  Labs reviewed:  Results for orders placed or performed during the hospital encounter of 07/02/18 (from the past 24 hour(s))  Amnisure rupture of membrane (rom)not at [facility]     Status: None   Collection Time: 07/02/18 10:11 PM  Result Value Ref Range   Amnisure ROM NEGATIVE    Amnisure negative  NST reactive: FHR  120/ moderate/ +accels/ no decelerations  Discharge home  Follow up as scheduled for prenatal appointment on Monday  Return to MAU as needed   Sharyon Cable, CNM 07/02/18, 11:16 PM

## 2018-07-02 NOTE — MAU Note (Signed)
Leaked some fld yesterday one time and it stopped. This am had bloody show and tonight leaked fld about an hour ago. Clear fld. Some ctxs that are tight but not painful.

## 2018-07-02 NOTE — Progress Notes (Signed)
Preop orders for scheduled rLTCS placed.   Gwenevere Abbot, MD 07/02/18 7:18 PM

## 2018-07-04 ENCOUNTER — Ambulatory Visit (INDEPENDENT_AMBULATORY_CARE_PROVIDER_SITE_OTHER): Admitting: Obstetrics & Gynecology

## 2018-07-04 ENCOUNTER — Encounter: Payer: Self-pay | Admitting: Obstetrics & Gynecology

## 2018-07-04 ENCOUNTER — Other Ambulatory Visit: Payer: Self-pay

## 2018-07-04 VITALS — BP 100/75 | HR 88 | Wt 198.0 lb

## 2018-07-04 DIAGNOSIS — Z3483 Encounter for supervision of other normal pregnancy, third trimester: Secondary | ICD-10-CM

## 2018-07-04 DIAGNOSIS — O34219 Maternal care for unspecified type scar from previous cesarean delivery: Secondary | ICD-10-CM

## 2018-07-04 DIAGNOSIS — O99210 Obesity complicating pregnancy, unspecified trimester: Secondary | ICD-10-CM

## 2018-07-04 DIAGNOSIS — Z9884 Bariatric surgery status: Secondary | ICD-10-CM

## 2018-07-04 DIAGNOSIS — Z3A37 37 weeks gestation of pregnancy: Secondary | ICD-10-CM

## 2018-07-04 NOTE — Progress Notes (Signed)
   TELEHEALTH VIRTUAL OBSTETRICS VISIT ENCOUNTER NOTE  I connected with [name] on 07/04/18 at 11:00 AM EDT by telephone at home and verified that I am speaking with the correct person using two identifiers.   I discussed the limitations, risks, security and privacy concerns of performing an evaluation and management service by telephone and the availability of in person appointments. I also discussed with the patient that there may be a patient responsible charge related to this service. The patient expressed understanding and agreed to proceed.  Subjective:  Gabrielle Roy is a 28 y.o. G3P1102 at [redacted]w[redacted]d being followed for ongoing prenatal care.  She is currently monitored for the following issues for this high-risk pregnancy and has Supervision of normal pregnancy; Anxiety state; Obesity in pregnancy; Depressive disorder, not elsewhere classified; History of gestational hypertension; Gastric bypass status for obesity; Previous cesarean section complicating pregnancy, antepartum condition or complication; Common migraine with intractable migraine; Back pain in pregnancy; Symphysis pubis disruption, initial encounter; Renal stones; Urinary urgency; Threatened premature labor in third trimester; Former smoker; Gastroesophageal reflux disease without esophagitis; Postoperative intestinal malabsorption; S/P laparoscopic sleeve gastrectomy; History of preterm delivery; and Late Entry into BRX opt schedule to limit social distancing due to COVID-19 on their problem list.  Patient reports no complaints. Reports fetal movement. Denies any contractions, bleeding or leaking of fluid.   The following portions of the patient's history were reviewed and updated as appropriate: allergies, current medications, past family history, past medical history, past social history, past surgical history and problem list.   Objective:   General:  Alert, oriented and cooperative.   Mental Status: Normal mood and  affect perceived. Normal judgment and thought content.  Rest of physical exam deferred due to type of encounter  Assessment and Plan:  Pregnancy: G3P1102 at [redacted]w[redacted]d 1. Encounter for supervision of other normal pregnancy in third trimester  2. Obesity in pregnancy  3. Gastric bypass status for obesity   4. Previous cesarean section complicating pregnancy, antepartum condition or complication - RLTCS this Friday (okay to do early per MFM)  Term labor symptoms and general obstetric precautions including but not limited to vaginal bleeding, contractions, leaking of fluid and fetal movement were reviewed in detail with the patient.  I discussed the assessment and treatment plan with the patient. The patient was provided an opportunity to ask questions and all were answered. The patient agreed with the plan and demonstrated an understanding of the instructions. The patient was advised to call back or seek an in-person office evaluation/go to [facility] at [facility] for any urgent or concerning symptoms. Please refer to After Visit Summary for other counseling recommendations.   I provided 10 minutes of non-face-to-face time during this encounter.  No follow-ups on file.  Future Appointments  Date Time Provider Department Center  07/04/2018 11:00 AM Allie Bossier, MD [facility]  07/07/2018 10:00 AM [facility] PAT 1 [facility] None    Allie Bossier, MD Center for [employer], [facility]

## 2018-07-07 ENCOUNTER — Other Ambulatory Visit: Payer: Self-pay | Admitting: *Deleted

## 2018-07-07 ENCOUNTER — Encounter (HOSPITAL_COMMUNITY)
Admission: RE | Admit: 2018-07-07 | Discharge: 2018-07-07 | Disposition: A | Discharge status: Home / Self Care | Source: Ambulatory Visit

## 2018-07-07 HISTORY — DX: Supervision of pregnancy with other poor reproductive or obstetric history, first trimester: O09.291

## 2018-07-07 MED ORDER — PHENAZOPYRIDINE HCL 200 MG PO TABS: 200.0000 mg | ORAL_TABLET | Freq: Three times a day (TID) | ORAL | 0 refills | Status: DC | PRN

## 2018-07-08 ENCOUNTER — Inpatient Hospital Stay (HOSPITAL_COMMUNITY): Admitting: Certified Registered Nurse Anesthetist

## 2018-07-08 ENCOUNTER — Other Ambulatory Visit: Payer: Self-pay

## 2018-07-08 ENCOUNTER — Encounter (HOSPITAL_COMMUNITY): Payer: Self-pay | Admitting: *Deleted

## 2018-07-08 ENCOUNTER — Inpatient Hospital Stay (HOSPITAL_COMMUNITY)
Admission: RE | Admit: 2018-07-08 | Discharge: 2018-07-10 | DRG: 787 | Disposition: A | Discharge status: Home / Self Care | Attending: Obstetrics and Gynecology | Admitting: Obstetrics and Gynecology

## 2018-07-08 ENCOUNTER — Encounter (HOSPITAL_COMMUNITY)
Admission: RE | Disposition: A | Discharge status: Home / Self Care | Payer: Self-pay | Source: Home / Self Care | Attending: Obstetrics and Gynecology

## 2018-07-08 DIAGNOSIS — Z3A38 38 weeks gestation of pregnancy: Secondary | ICD-10-CM

## 2018-07-08 DIAGNOSIS — O99844 Bariatric surgery status complicating childbirth: Secondary | ICD-10-CM | POA: Diagnosis present

## 2018-07-08 DIAGNOSIS — D649 Anemia, unspecified: Secondary | ICD-10-CM | POA: Diagnosis present

## 2018-07-08 DIAGNOSIS — Z87891 Personal history of nicotine dependence: Secondary | ICD-10-CM

## 2018-07-08 DIAGNOSIS — O9902 Anemia complicating childbirth: Secondary | ICD-10-CM | POA: Diagnosis present

## 2018-07-08 DIAGNOSIS — O34211 Maternal care for low transverse scar from previous cesarean delivery: Secondary | ICD-10-CM | POA: Diagnosis present

## 2018-07-08 DIAGNOSIS — Z98891 History of uterine scar from previous surgery: Secondary | ICD-10-CM

## 2018-07-08 DIAGNOSIS — Z8751 Personal history of pre-term labor: Secondary | ICD-10-CM

## 2018-07-08 DIAGNOSIS — F17200 Nicotine dependence, unspecified, uncomplicated: Secondary | ICD-10-CM

## 2018-07-08 DIAGNOSIS — S334XXA Traumatic rupture of symphysis pubis, initial encounter: Secondary | ICD-10-CM | POA: Diagnosis present

## 2018-07-08 DIAGNOSIS — O99019 Anemia complicating pregnancy, unspecified trimester: Secondary | ICD-10-CM | POA: Diagnosis present

## 2018-07-08 LAB — CBC
HCT: 33.5 % — ABNORMAL LOW (ref 36.0–46.0)
HCT: 31.6 % — ABNORMAL LOW (ref 36.0–46.0)
Hemoglobin: 11.3 g/dL — ABNORMAL LOW (ref 12.0–15.0)
Hemoglobin: 11.1 g/dL — ABNORMAL LOW (ref 12.0–15.0)
MCH: 33.3 pg (ref 26.0–34.0)
MCH: 34.3 pg — ABNORMAL HIGH (ref 26.0–34.0)
MCHC: 33.7 g/dL (ref 30.0–36.0)
MCHC: 35.1 g/dL (ref 30.0–36.0)
MCV: 98.8 fL (ref 80.0–100.0)
MCV: 97.5 fL (ref 80.0–100.0)
Platelets: 221 10*3/uL (ref 150–400)
Platelets: 200 10*3/uL (ref 150–400)
RBC: 3.39 MIL/uL — ABNORMAL LOW (ref 3.87–5.11)
RBC: 3.24 MIL/uL — ABNORMAL LOW (ref 3.87–5.11)
RDW: 12.5 % (ref 11.5–15.5)
RDW: 12.3 % (ref 11.5–15.5)
WBC: 7.1 10*3/uL (ref 4.0–10.5)
WBC: 13.6 10*3/uL — ABNORMAL HIGH (ref 4.0–10.5)
nRBC: 0 % (ref 0.0–0.2)
nRBC: 0 % (ref 0.0–0.2)

## 2018-07-08 LAB — RAPID HIV SCREEN (HIV 1/2 AB+AG)
HIV 1/2 Antibodies: NONREACTIVE
HIV-1 P24 Antigen - HIV24: NONREACTIVE

## 2018-07-08 LAB — CREATININE, SERUM
Creatinine, Ser: 0.65 mg/dL (ref 0.44–1.00)
GFR calc Af Amer: >60 mL/min (ref >60)
GFR calc non Af Amer: >60 mL/min (ref >60)

## 2018-07-08 LAB — TYPE AND SCREEN
ABO/RH(D): O POS
Antibody Screen: NEGATIVE

## 2018-07-08 LAB — ABO/RH: ABO/RH(D): O POS

## 2018-07-08 LAB — RPR: RPR Ser Ql: NONREACTIVE

## 2018-07-08 SURGERY — Surgical Case
Anesthesia: Spinal | Site: Abdomen | Wound class: Clean Contaminated

## 2018-07-08 SURGERY — Surgical Case
Anesthesia: *Unknown

## 2018-07-08 MED ORDER — MENTHOL 3 MG MT LOZG: 1.0000 | LOZENGE | OROMUCOSAL | Status: DC | PRN

## 2018-07-08 MED ORDER — ONDANSETRON HCL 4 MG/2ML IJ SOLN
INTRAMUSCULAR | Status: AC
  Filled 2018-07-08: qty 2

## 2018-07-08 MED ORDER — SCOPOLAMINE 1 MG/3DAYS TD PT72
MEDICATED_PATCH | TRANSDERMAL | Status: AC
  Filled 2018-07-08: qty 1

## 2018-07-08 MED ORDER — HYDROMORPHONE HCL 1 MG/ML IJ SOLN: 0.2500 mg | INTRAMUSCULAR | Status: DC | PRN

## 2018-07-08 MED ORDER — PROMETHAZINE HCL 25 MG/ML IJ SOLN: 6.2500 mg | INTRAMUSCULAR | Status: DC | PRN

## 2018-07-08 MED ORDER — SIMETHICONE 80 MG PO CHEW
80.0000 mg | CHEWABLE_TABLET | Freq: Three times a day (TID) | ORAL | Status: DC
  Administered 2018-07-08 – 2018-07-10 (×5): 80 mg via ORAL
  Filled 2018-07-08 (×4): qty 1

## 2018-07-08 MED ORDER — NALOXONE HCL 0.4 MG/ML IJ SOLN: 0.4000 mg | INTRAMUSCULAR | Status: DC | PRN

## 2018-07-08 MED ORDER — IBUPROFEN 600 MG PO TABS
600.0000 mg | ORAL_TABLET | Freq: Four times a day (QID) | ORAL | Status: DC | PRN
  Administered 2018-07-09 – 2018-07-10 (×4): 600 mg via ORAL
  Filled 2018-07-08 (×4): qty 1

## 2018-07-08 MED ORDER — DIPHENHYDRAMINE HCL 50 MG/ML IJ SOLN
INTRAMUSCULAR | Status: DC | PRN
  Administered 2018-07-08: 6.25 mg via INTRAVENOUS

## 2018-07-08 MED ORDER — MORPHINE SULFATE (PF) 0.5 MG/ML IJ SOLN
INTRAMUSCULAR | Status: DC | PRN
  Administered 2018-07-08: 150 ug via INTRATHECAL

## 2018-07-08 MED ORDER — PHENYLEPHRINE HCL-NACL 20-0.9 MG/250ML-% IV SOLN
INTRAVENOUS | Status: DC | PRN
  Administered 2018-07-08: 60 ug/min via INTRAVENOUS

## 2018-07-08 MED ORDER — BUPIVACAINE HCL (PF) 0.5 % IJ SOLN
INTRAMUSCULAR | Status: AC
  Filled 2018-07-08: qty 30

## 2018-07-08 MED ORDER — METOCLOPRAMIDE HCL 5 MG/ML IJ SOLN
INTRAMUSCULAR | Status: AC
  Filled 2018-07-08: qty 2

## 2018-07-08 MED ORDER — ONDANSETRON HCL 4 MG/2ML IJ SOLN
INTRAMUSCULAR | Status: DC | PRN
  Administered 2018-07-08: 4 mg via INTRAVENOUS

## 2018-07-08 MED ORDER — SODIUM CHLORIDE 0.9 % IV SOLN
INTRAVENOUS | Status: DC | PRN
  Administered 2018-07-08: 11:00:00 via INTRAVENOUS

## 2018-07-08 MED ORDER — LACTATED RINGERS IV SOLN
INTRAVENOUS | Status: DC
  Administered 2018-07-08: 20:00:00 via INTRAVENOUS

## 2018-07-08 MED ORDER — BUPIVACAINE HCL (PF) 0.5 % IJ SOLN
INTRAMUSCULAR | Status: DC | PRN
  Administered 2018-07-08: 30 mL

## 2018-07-08 MED ORDER — SOD CITRATE-CITRIC ACID 500-334 MG/5ML PO SOLN
30.0000 mL | ORAL | Status: AC
  Administered 2018-07-08: 30 mL via ORAL

## 2018-07-08 MED ORDER — OXYTOCIN 10 UNIT/ML IJ SOLN
INTRAMUSCULAR | Status: DC | PRN
  Administered 2018-07-08: 40 [IU] via INTRAMUSCULAR

## 2018-07-08 MED ORDER — OXYCODONE HCL 5 MG/5ML PO SOLN: 5.0000 mg | Freq: Once | ORAL | Status: DC | PRN

## 2018-07-08 MED ORDER — KETOROLAC TROMETHAMINE 30 MG/ML IJ SOLN
INTRAMUSCULAR | Status: AC
  Filled 2018-07-08: qty 1

## 2018-07-08 MED ORDER — PHENYLEPHRINE HCL (PRESSORS) 10 MG/ML IV SOLN
INTRAVENOUS | Status: DC | PRN
  Administered 2018-07-08 (×2): 40 ug via INTRAVENOUS
  Administered 2018-07-08: 80 ug via INTRAVENOUS

## 2018-07-08 MED ORDER — ACETAMINOPHEN 500 MG PO TABS
1000.0000 mg | ORAL_TABLET | Freq: Four times a day (QID) | ORAL | Status: DC
  Administered 2018-07-08 – 2018-07-09 (×3): 1000 mg via ORAL
  Filled 2018-07-08 (×4): qty 2

## 2018-07-08 MED ORDER — FENTANYL CITRATE (PF) 100 MCG/2ML IJ SOLN
INTRAMUSCULAR | Status: AC
  Filled 2018-07-08: qty 2

## 2018-07-08 MED ORDER — KETOROLAC TROMETHAMINE 30 MG/ML IJ SOLN
30.0000 mg | Freq: Once | INTRAMUSCULAR | Status: AC | PRN
  Administered 2018-07-08: 30 mg via INTRAVENOUS

## 2018-07-08 MED ORDER — KETOROLAC TROMETHAMINE 30 MG/ML IJ SOLN
30.0000 mg | Freq: Four times a day (QID) | INTRAMUSCULAR | Status: DC
  Administered 2018-07-08 – 2018-07-09 (×2): 30 mg via INTRAVENOUS
  Filled 2018-07-08 (×2): qty 1

## 2018-07-08 MED ORDER — FENTANYL CITRATE (PF) 100 MCG/2ML IJ SOLN
INTRAMUSCULAR | Status: DC | PRN
  Administered 2018-07-08: 15 ug via INTRATHECAL

## 2018-07-08 MED ORDER — DEXAMETHASONE SODIUM PHOSPHATE 4 MG/ML IJ SOLN
INTRAMUSCULAR | Status: AC
  Filled 2018-07-08: qty 7

## 2018-07-08 MED ORDER — ONDANSETRON HCL 4 MG/2ML IJ SOLN: 4.0000 mg | Freq: Three times a day (TID) | INTRAMUSCULAR | Status: DC | PRN

## 2018-07-08 MED ORDER — PROPOFOL 10 MG/ML IV BOLUS
INTRAVENOUS | Status: DC | PRN
  Administered 2018-07-08 (×2): 10 mg via INTRAVENOUS

## 2018-07-08 MED ORDER — DIPHENHYDRAMINE HCL 50 MG/ML IJ SOLN
INTRAMUSCULAR | Status: AC
  Filled 2018-07-08: qty 1

## 2018-07-08 MED ORDER — TETANUS-DIPHTH-ACELL PERTUSSIS 5-2.5-18.5 LF-MCG/0.5 IM SUSP: 0.5000 mL | Freq: Once | INTRAMUSCULAR | Status: DC

## 2018-07-08 MED ORDER — NALBUPHINE HCL 10 MG/ML IJ SOLN: 5.0000 mg | Freq: Once | INTRAMUSCULAR | Status: DC | PRN

## 2018-07-08 MED ORDER — PRENATAL MULTIVITAMIN CH
1.0000 | ORAL_TABLET | Freq: Every day | ORAL | Status: DC
  Administered 2018-07-09 – 2018-07-10 (×2): 1 via ORAL
  Filled 2018-07-08 (×2): qty 1

## 2018-07-08 MED ORDER — ENOXAPARIN SODIUM 40 MG/0.4ML ~~LOC~~ SOLN
40.0000 mg | SUBCUTANEOUS | Status: DC
  Administered 2018-07-09: 40 mg via SUBCUTANEOUS
  Filled 2018-07-08: qty 0.4

## 2018-07-08 MED ORDER — PHENYLEPHRINE HCL-NACL 20-0.9 MG/250ML-% IV SOLN
INTRAVENOUS | Status: AC
  Filled 2018-07-08: qty 250

## 2018-07-08 MED ORDER — SIMETHICONE 80 MG PO CHEW
80.0000 mg | CHEWABLE_TABLET | ORAL | Status: DC | PRN
  Administered 2018-07-09: 80 mg via ORAL
  Filled 2018-07-08: qty 1

## 2018-07-08 MED ORDER — MEPERIDINE HCL 25 MG/ML IJ SOLN: 6.2500 mg | INTRAMUSCULAR | Status: DC | PRN

## 2018-07-08 MED ORDER — SENNOSIDES-DOCUSATE SODIUM 8.6-50 MG PO TABS
2.0000 | ORAL_TABLET | ORAL | Status: DC
  Administered 2018-07-08 – 2018-07-10 (×2): 2 via ORAL
  Filled 2018-07-08 (×2): qty 2

## 2018-07-08 MED ORDER — SCOPOLAMINE 1 MG/3DAYS TD PT72
1.0000 | MEDICATED_PATCH | Freq: Once | TRANSDERMAL | Status: DC
  Administered 2018-07-08: 1.5 mg via TRANSDERMAL

## 2018-07-08 MED ORDER — CEFAZOLIN SODIUM-DEXTROSE 2-4 GM/100ML-% IV SOLN
INTRAVENOUS | Status: AC
  Filled 2018-07-08: qty 100

## 2018-07-08 MED ORDER — CEFAZOLIN SODIUM-DEXTROSE 2-4 GM/100ML-% IV SOLN
2.0000 g | INTRAVENOUS | Status: AC
  Administered 2018-07-08: 2 g via INTRAVENOUS

## 2018-07-08 MED ORDER — ZOLPIDEM TARTRATE 5 MG PO TABS: 5.0000 mg | ORAL_TABLET | Freq: Every evening | ORAL | Status: DC | PRN

## 2018-07-08 MED ORDER — STERILE WATER FOR IRRIGATION IR SOLN
Status: DC | PRN
  Administered 2018-07-08: 1

## 2018-07-08 MED ORDER — NALBUPHINE HCL 10 MG/ML IJ SOLN: 5.0000 mg | INTRAMUSCULAR | Status: DC | PRN

## 2018-07-08 MED ORDER — METOCLOPRAMIDE HCL 5 MG/ML IJ SOLN
INTRAMUSCULAR | Status: DC | PRN
  Administered 2018-07-08: 10 mg via INTRAVENOUS

## 2018-07-08 MED ORDER — COCONUT OIL OIL
1.0000 "application " | TOPICAL_OIL | Status: DC | PRN
  Administered 2018-07-10: 1 via TOPICAL

## 2018-07-08 MED ORDER — OXYCODONE HCL 5 MG PO TABS: 5.0000 mg | ORAL_TABLET | Freq: Once | ORAL | Status: DC | PRN

## 2018-07-08 MED ORDER — DIPHENHYDRAMINE HCL 25 MG PO CAPS
25.0000 mg | ORAL_CAPSULE | Freq: Four times a day (QID) | ORAL | Status: DC | PRN
  Administered 2018-07-08: 25 mg via ORAL

## 2018-07-08 MED ORDER — SIMETHICONE 80 MG PO CHEW
80.0000 mg | CHEWABLE_TABLET | ORAL | Status: DC
  Administered 2018-07-10: 80 mg via ORAL
  Filled 2018-07-08 (×2): qty 1

## 2018-07-08 MED ORDER — OXYTOCIN 40 UNITS IN NORMAL SALINE INFUSION - SIMPLE MED: 2.5000 [IU]/h | INTRAVENOUS | Status: DC

## 2018-07-08 MED ORDER — DEXAMETHASONE SODIUM PHOSPHATE 4 MG/ML IJ SOLN
INTRAMUSCULAR | Status: DC | PRN
  Administered 2018-07-08: 8 mg via INTRAVENOUS

## 2018-07-08 MED ORDER — SOD CITRATE-CITRIC ACID 500-334 MG/5ML PO SOLN
ORAL | Status: AC
  Filled 2018-07-08: qty 15

## 2018-07-08 MED ORDER — OXYTOCIN 40 UNITS IN NORMAL SALINE INFUSION - SIMPLE MED
INTRAVENOUS | Status: AC
  Filled 2018-07-08: qty 1000

## 2018-07-08 MED ORDER — BUPIVACAINE IN DEXTROSE 0.75-8.25 % IT SOLN
INTRATHECAL | Status: DC | PRN
  Administered 2018-07-08: 1.6 mL via INTRATHECAL

## 2018-07-08 MED ORDER — WITCH HAZEL-GLYCERIN EX PADS: 1.0000 "application " | MEDICATED_PAD | CUTANEOUS | Status: DC | PRN

## 2018-07-08 MED ORDER — DIPHENHYDRAMINE HCL 50 MG/ML IJ SOLN: 12.5000 mg | INTRAMUSCULAR | Status: DC | PRN

## 2018-07-08 MED ORDER — NALOXONE HCL 4 MG/10ML IJ SOLN
1.0000 ug/kg/h | INTRAVENOUS | Status: DC | PRN
  Filled 2018-07-08: qty 5

## 2018-07-08 MED ORDER — SODIUM CHLORIDE 0.9 % IR SOLN
Status: DC | PRN
  Administered 2018-07-08: 1

## 2018-07-08 MED ORDER — SODIUM CHLORIDE 0.9% FLUSH: 3.0000 mL | INTRAVENOUS | Status: DC | PRN

## 2018-07-08 MED ORDER — DIBUCAINE (PERIANAL) 1 % EX OINT: 1.0000 "application " | TOPICAL_OINTMENT | CUTANEOUS | Status: DC | PRN

## 2018-07-08 MED ORDER — MORPHINE SULFATE (PF) 0.5 MG/ML IJ SOLN
INTRAMUSCULAR | Status: AC
  Filled 2018-07-08: qty 10

## 2018-07-08 MED ORDER — LACTATED RINGERS IV SOLN
INTRAVENOUS | Status: DC
  Administered 2018-07-08 (×2): via INTRAVENOUS

## 2018-07-08 MED ORDER — OXYCODONE-ACETAMINOPHEN 5-325 MG PO TABS
1.0000 | ORAL_TABLET | ORAL | Status: DC | PRN
  Administered 2018-07-09: 2 via ORAL
  Administered 2018-07-09: 1 via ORAL
  Administered 2018-07-09 – 2018-07-10 (×4): 2 via ORAL
  Filled 2018-07-08: qty 2
  Filled 2018-07-08: qty 1
  Filled 2018-07-08: qty 2
  Filled 2018-07-08: qty 1
  Filled 2018-07-08 (×2): qty 2
  Filled 2018-07-08: qty 1

## 2018-07-08 MED ORDER — DIPHENHYDRAMINE HCL 25 MG PO CAPS
25.0000 mg | ORAL_CAPSULE | ORAL | Status: DC | PRN
  Administered 2018-07-08: 25 mg via ORAL
  Filled 2018-07-08 (×2): qty 1

## 2018-07-08 MED ORDER — PHENYLEPHRINE 40 MCG/ML (10ML) SYRINGE FOR IV PUSH (FOR BLOOD PRESSURE SUPPORT)
PREFILLED_SYRINGE | INTRAVENOUS | Status: AC
  Filled 2018-07-08: qty 10

## 2018-07-08 SURGICAL SUPPLY — 34 items
BENZOIN TINCTURE PRP APPL 2/3 (GAUZE/BANDAGES/DRESSINGS) ×2 IMPLANT
CANISTER SUCT 3000ML PPV (MISCELLANEOUS) ×2 IMPLANT
CHLORAPREP W/TINT 26ML (MISCELLANEOUS) ×2 IMPLANT
CLOSURE STERI STRIP 1/2 X4 (GAUZE/BANDAGES/DRESSINGS) ×2 IMPLANT
DRSG OPSITE POSTOP 4X10 (GAUZE/BANDAGES/DRESSINGS) ×2 IMPLANT
ELECT REM PT RETURN 9FT ADLT (ELECTROSURGICAL) ×2
ELECTRODE REM PT RTRN 9FT ADLT (ELECTROSURGICAL) ×1 IMPLANT
EXTRACTOR VACUUM KIWI (MISCELLANEOUS) ×2 IMPLANT
GLOVE BIOGEL PI IND STRL 7.0 (GLOVE) ×2 IMPLANT
GLOVE BIOGEL PI IND STRL 7.5 (GLOVE) ×1 IMPLANT
GLOVE BIOGEL PI INDICATOR 7.0 (GLOVE) ×2
GLOVE BIOGEL PI INDICATOR 7.5 (GLOVE) ×1
GLOVE SKINSENSE NS SZ7.0 (GLOVE) ×1
GLOVE SKINSENSE STRL SZ7.0 (GLOVE) ×1 IMPLANT
GOWN STRL REUS W/ TWL LRG LVL3 (GOWN DISPOSABLE) ×2 IMPLANT
GOWN STRL REUS W/ TWL XL LVL3 (GOWN DISPOSABLE) ×1 IMPLANT
GOWN STRL REUS W/TWL LRG LVL3 (GOWN DISPOSABLE) ×2
GOWN STRL REUS W/TWL XL LVL3 (GOWN DISPOSABLE) ×1
NS IRRIG 1000ML POUR BTL (IV SOLUTION) ×2 IMPLANT
PACK C SECTION WH (CUSTOM PROCEDURE TRAY) ×2 IMPLANT
PAD ABD 7.5X8 STRL (GAUZE/BANDAGES/DRESSINGS) ×2 IMPLANT
PAD OB MATERNITY 4.3X12.25 (PERSONAL CARE ITEMS) ×2 IMPLANT
PAD PREP 24X48 CUFFED NSTRL (MISCELLANEOUS) ×2 IMPLANT
PENCIL SMOKE EVAC W/HOLSTER (ELECTROSURGICAL) ×2 IMPLANT
STRIP CLOSURE SKIN 1/2X4 (GAUZE/BANDAGES/DRESSINGS) ×2 IMPLANT
SUT MNCRL 0 VIOLET CTX 36 (SUTURE) ×2 IMPLANT
SUT MON AB 4-0 PS1 27 (SUTURE) ×2 IMPLANT
SUT MONOCRYL 0 CTX 36 (SUTURE) ×2
SUT PLAIN 2 0 XLH (SUTURE) ×2 IMPLANT
SUT VIC AB 0 CT1 36 (SUTURE) ×4 IMPLANT
SUT VIC AB 3-0 CT1 27 (SUTURE) ×3
SUT VIC AB 3-0 CT1 TAPERPNT 27 (SUTURE) ×3 IMPLANT
TOWEL OR 17X24 6PK STRL BLUE (TOWEL DISPOSABLE) ×4 IMPLANT
WATER STERILE IRR 1000ML POUR (IV SOLUTION) ×2 IMPLANT

## 2018-07-08 NOTE — Discharge Summary (Signed)
Postpartum Discharge Summary     Patient Name: Gabrielle Roy DOB: 01-Jun-1990 MRN: [id number]  Date of admission: 07/08/2018 Delivering Provider: Allie Bossier   Date of discharge: 07/10/2018  Admitting diagnosis: RCS Intractable Pain Intrauterine pregnancy: [redacted]w[redacted]d     Secondary diagnosis:  Active Problems:   Symphysis pubis disruption, initial encounter   Former smoker   History of preterm delivery   History of cesarean section   Anemia in pregnancy      Discharge diagnosis: Term Pregnancy Delivered and Anemia                                                                                                Post partum procedures:None  Augmentation: NA  Complications: None  Hospital course:  Scheduled C/S   28 y.o. yo G3P2103 at [redacted]w[redacted]d was admitted to the hospital 07/08/2018 for scheduled cesarean section with the following indication:Prior Uterine Surgery.  Membrane Rupture Time/Date: 10:27 AM ,07/08/2018   Patient delivered a Viable infant.07/08/2018  Details of operation can be found in separate operative note.  Pateint had an uncomplicated postpartum course.  She is ambulating, tolerating a regular diet, passing flatus, and urinating well. Patient is discharged home in stable condition on  07/10/18         Magnesium Sulfate recieved: No BMZ received: No  Physical exam  Vitals:   07/09/18 0935 07/09/18 1444 07/09/18 1952 07/10/18 0600  BP: (!) 103/55 (!) 123/54 120/63 106/60  Pulse: 62 62 (!) 57 (!) 54  Resp: 20 17 18 18   Temp: 98.7 F (37.1 C) 98 F (36.7 C) 97.9 F (36.6 C) 98.5 F (36.9 C)  TempSrc:  Oral Oral Oral  SpO2: 100% 100%    Weight:      Height:       General: alert, cooperative and no distress Lochia: appropriate Uterine Fundus: firm Incision: Healing well with no significant drainage, No significant erythema, Dressing is clean, dry, and intact DVT Evaluation: No evidence of DVT seen on physical exam. Labs: Lab Results  Component Value Date   WBC  11.7 (H) 07/09/2018   HGB 9.1 (L) 07/09/2018   HCT 26.3 (L) 07/09/2018   MCV 99.6 07/09/2018   PLT 173 07/09/2018   CMP Latest Ref Rng & Units 07/08/2018  Glucose 65 - 99 mg/dL -  BUN 7 - 25 mg/dL -  Creatinine 2.24 - 1.14 mg/dL 6.43  Sodium 142 - 767 mmol/L -  Potassium 3.5 - 5.3 mmol/L -  Chloride 98 - 110 mmol/L -  CO2 20 - 32 mmol/L -  Calcium 8.6 - 10.2 mg/dL -  Total Protein 6.1 - 8.1 g/dL -  Total Bilirubin 0.2 - 1.2 mg/dL -  Alkaline Phos 38 - 011 U/L -  AST 10 - 30 U/L -  ALT 6 - 29 U/L -    Discharge instruction: per After Visit Summary and "Baby and Me Booklet".  After visit meds:  Allergies as of 07/10/2018      Reactions   Alcohol Hives   Allergic to alcohol containing products including cough medicines that contain alcohol  Flagyl [metronidazole] Other (See Comments)   Facial flushing   Hydrocodone-acetaminophen Nausea And Vomiting      Medication List    TAKE these medications   cephALEXin 500 MG capsule Commonly known as:  KEFLEX Take 500 mg by mouth daily.   ferrous sulfate 325 (65 FE) MG EC tablet Take 1 tablet (325 mg total) by mouth 2 (two) times daily.   oxyCODONE-acetaminophen 5-325 MG tablet Commonly known as:  PERCOCET/ROXICET Take 1-2 tablets by mouth every 4 (four) hours as needed for moderate pain.   phenazopyridine 200 MG tablet Commonly known as:  PYRIDIUM Take 1 tablet (200 mg total) by mouth 3 (three) times daily as needed for pain.   PRENATAL VITAMIN PO Take 1 tablet by mouth daily.   senna-docusate 8.6-50 MG tablet Commonly known as:  Senokot-S Take 2 tablets by mouth daily. Start taking on:  Jul 11, 2018   Specimen Catheter Female Misc 1 Device by Does not apply route 4 (four) times daily. Pt is to use catheter QID for self cath   tamsulosin 0.4 MG Caps capsule Commonly known as:  FLOMAX Take 1 capsule (0.4 mg total) by mouth daily.   traMADol 50 MG tablet Commonly known as:  ULTRAM Take 50 mg by mouth 2 (two) times  daily as needed for severe pain.       Diet: routine diet  Activity: Advance as tolerated. Pelvic rest for 6 weeks.   Outpatient follow up:6 weeks Follow up Appt: Future Appointments  Date Time Provider Department Center  08/12/2018 10:00 AM Donette LarryBhambri, Melanie, CNM [facility]   Follow up Visit: Follow-up Information    Center [facility] [place]. Schedule an appointment as soon as possible for a visit in 2 week(s).   Specialty:  Obstetrics and Gynecology Why:  incision check  Contact information: [address], [address] [place] [id number] [phone]         Please schedule this patient for Postpartum visit in: 6 weeks with the following provider: Any provider For C/S patients schedule nurse incision check in weeks 2 weeks: yes High risk pregnancy complicated by: hx of gastric bypass, ureteral stent, preterm labor, hx of preterm delivery Delivery mode:  CS Anticipated Birth Control:  other/unsure PP Procedures needed: Incision check  Schedule Integrated BH visit: no   Newborn Data: Live born female  Birth Weight:   APGAR: 8, 8  Newborn Delivery   Birth date/time:  07/08/2018 10:28:00 Delivery type:  C-Section, Low Transverse Trial of labor:  No C-section categorization:  Repeat     Baby Feeding: Breast Disposition:home with mother   07/10/2018 De Hollingsheadatherine L Gara Kincade, DO

## 2018-07-08 NOTE — H&P (Signed)
Obstetric Preoperative History and Physical  Gabrielle Roy is a 28 y.o. married G3P1102 (2 and 5 yo kids) with IUP at [redacted]w[redacted]d presenting for scheduled cesarean section.  Reports good fetal movement, no bleeding, no contractions, no leaking of fluid.  No acute preoperative concerns.    Cesarean Section Indication: repeat and inctractable urological pain  Prenatal Course Source of Care: [facility]  Pregnancy complications or risks: Patient Active Problem List   Diagnosis Date Noted  . History of cesarean section 07/08/2018  . Late Entry into BRX opt schedule to limit social distancing due to COVID-19 05/30/2018  . History of preterm delivery 05/25/2018  . Threatened premature labor in third trimester 05/04/2018  . Renal stones 04/25/2018  . Urinary urgency 04/25/2018  . Symphysis pubis disruption, initial encounter 03/30/2018  . Back pain in pregnancy 03/28/2018  . Common migraine with intractable migraine 03/10/2018  . History of gestational hypertension 12/29/2017  . 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 Previous cesarean section complicating pregnancy, antepartum condition or complication 12/29/2017  . Postoperative intestinal malabsorption 06/07/2017  . 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 Former smoker 05/06/2017  . Obesity in pregnancy 11/06/2015  . Supervision of normal pregnancy 10/04/2015  . Anxiety state 09/29/2013  . Depressive disorder, not elsewhere classified 09/29/2013  . Gastroesophageal reflux disease without esophagitis 08/16/2013   She plans to breastfeed She desires considering POPs, Nexplanon for postpartum contraception.   Prenatal labs and studies: ABO, Rh: --/--/O POS (05/01 0750) Antibody: NEG (05/01 0750) Rubella: 1.08 (10/23 1416) RPR: NON-REACTIVE (10/23 1416)  HBsAg: NON-REACTIVE (10/23 1416)  HIV: NON REACTIVE (05/01 0644)  GBS:  1 hr Glucola  normal Genetic screening normal Anatomy US normal  Prenatal Transfer Tool   Maternal Diabetes: No Genetic Screening: Normal Maternal Ultrasounds/Referrals: Normal Fetal Ultrasounds or other Referrals:  None Maternal Substance Abuse:  No Significant Maternal Medications:  None Significant Maternal Lab Results: None  Past Medical History:  Diagnosis Date  . Common migraine with intractable migraine 03/10/2018  . Hx of pre-eclampsia in prior pregnancy, currently pregnant, first trimester   . Kidney stone    Stent placed in Feb 2017  . PONV (postoperative nausea and vomiting)    nausea during last c section  . Supervision of normal pregnancy, antepartum 10/04/2015    Clinic  [facility] - transfer from Dr. Shawnie Pons Prenatal Labs Dating  Blood type:    Genetic Screen 1 Screen:    AFP:     Quad:     NIPS: Antibody:  Anatomic [place]  Nml fetal anatomy @ 18 wks; low lying placenta > resolved @23  wks Rubella:   GTT Early:               Third trimester:  RPR:    Flu vaccine  Declined HBsAg:    TDaP vaccine                                               Rhogam: HIV:    Baby Food  Breastfeed                                            GBS: (For PCN allergy, check sensitivities) Contraception  Pap: Circumcision  n/a female ([facility] ultrasound)  Pediatrician   Support Person  [name] (husband)      Past Surgical History:  Procedure Laterality Date  . CESAREAN SECTION    . CESAREAN SECTION N/A 02/20/2016   Procedure: REPEAT CESAREAN SECTION;  Surgeon: Hermina StaggersMichael L Ervin, MD;  [facility];  Service: Obstetrics;  Laterality: N/A;  . CYSTOSCOPY W/ URETERAL STENT PLACEMENT Left 04/26/2018   Procedure: CYSTOSCOPY WITH RETROGRADE PYELOGRAM/URETERAL STENT PLACEMENT;  Surgeon: Crist FatHerrick, Benjamin W, MD;  Location: [facility] ORS;  Service: Urology;  Laterality: Left;  . FINGER SURGERY    . GASTRIC BYPASS      OB History  Gravida Para Term Preterm AB Living  3 2 1 1   2   SAB TAB Ectopic Multiple Live Births        0 2    # Outcome Date GA Lbr Len/2nd Weight Sex Delivery Anes PTL Lv   3 Current           2 Term 02/20/16 4919w2d  4040 g F CS-LTranv Spinal  LIV  1 Preterm 07/20/12 1765w0d  2268 g F CS-LTranv  N LIV     Complications: Fetal Intolerance, Oligohydramnios antepartum    Social History   Socioeconomic History  . Marital status: Married    Spouse name: Not on file  . Number of children: Not on file  . Years of education: Not on file  . Highest education level: Not on file  Occupational History  . Occupation: Not working   Social Needs  . Financial resource strain: Not hard at all  . Food insecurity:    Worry: Never true    Inability: Never true  . Transportation needs:    Medical: No    Non-medical: Not on file  Tobacco Use  . Smoking status: Former Games developermoker  . Smokeless tobacco: Never Used  Substance and Sexual Activity  . Alcohol use: No  . Drug use: No  . Sexual activity: Not Currently    Birth control/protection: None  Lifestyle  . Physical activity:    Days per week: Not on file    Minutes per session: Not on file  . Stress: Only a little  Relationships  . Social connections:    Talks on phone: Not on file    Gets together: Not on file    Attends religious service: Not on file    Active member of club or organization: Not on file    Attends meetings of clubs or organizations: Not on file    Relationship status: Not on file  Other Topics Concern  . Not on file  Social History Narrative   Right handed    Lives at home with children and spouse   Caffeine 2 cups per day         Family History  Problem Relation Age of Onset  . Hypertension Father   . Heart disease Father   . Hearing loss Father     Medications Prior to Admission  Medication Sig Dispense Refill Last Dose  . acetaminophen (TYLENOL) 500 MG tablet Take 1,000 mg by mouth daily as needed for moderate pain.   Taking  . Catheters (SPECIMEN CATHETER FEMALE) MISC 1 Device by Does not apply route 4 (four) times daily. Pt is to use catheter QID for self cath 120 each 6  Taking  . phenazopyridine (PYRIDIUM) 200 MG tablet Take 1 tablet (200 mg total) by mouth 3 (three) times daily as needed for pain. 12 tablet 0   . Prenatal Vit-Fe  Fumarate-FA (PRENATAL VITAMIN PO) Take by mouth.   Taking  . tamsulosin (FLOMAX) 0.4 MG CAPS capsule Take 1 capsule (0.4 mg total) by mouth daily. 30 capsule 1 Taking    Allergies  Allergen Reactions  . Alcohol Hives    Allergic to alcohol containing products including cough medicines that contain alcohol  . Flagyl [Metronidazole] Other (See Comments)    Facial flushing  . Hydrocodone-Acetaminophen Nausea And Vomiting    Review of Systems: Pertinent items noted in HPI and remainder of comprehensive ROS otherwise negative.  Physical Exam: BP 111/65   Pulse 82   Temp 98.4 F (36.9 C) (Oral)   Resp 16   Ht  (1.549 m)   Wt 89.8 kg   LMP 10/21/2017   SpO2 (!) 76%   BMI 37.41 kg/m   CONSTITUTIONAL: Well-developed, well-nourished female in no acute distress.  HENT:  Normocephalic, atraumatic, External right and left ear normal. Oropharynx is clear and moist EYES: Conjunctivae and EOM are normal. Pupils are equal, round, and reactive to light. No scleral icterus.  NECK: Normal range of motion, supple, no masses SKIN: Skin is warm and dry. No rash noted. Not diaphoretic. No erythema. No pallor. NEUROLGIC: Alert and oriented to person, place, and time. Normal reflexes, muscle tone coordination. No cranial nerve deficit noted. PSYCHIATRIC: Normal mood and affect. Normal behavior. Normal judgment and thought content. CARDIOVASCULAR: Normal heart rate noted, regular rhythm RESPIRATORY: Effort and breath sounds normal, no problems with respiration noted ABDOMEN: Soft, nontender, nondistended, gravid. Well-healed Pfannenstiel incision. PELVIC: Deferred MUSCULOSKELETAL: Normal range of motion. No edema and no tenderness. 2+ distal pulses.   Pertinent Labs/Studies:   Results for orders placed or performed during the  hospital encounter of 07/08/18 (from the past 72 hour(s))  CBC     Status: Abnormal   Collection Time: 07/08/18  6:44 AM  Result Value Ref Range   WBC 7.1 4.0 - 10.5 K/uL   RBC 3.39 (L) 3.87 - 5.11 MIL/uL   Hemoglobin 11.3 (L) 12.0 - 15.0 g/dL   HCT 16.1 (L) 09.6 - 04.5 %   MCV 98.8 80.0 - 100.0 fL   MCH 33.3 26.0 - 34.0 pg   MCHC 33.7 30.0 - 36.0 g/dL   RDW 40.9 81.1 - 91.4 %   Platelets 221 150 - 400 K/uL   nRBC 0.0 0.0 - 0.2 %    Comment: Performed at [facility], [address] [place], [place] [address]  Rapid HIV screen (HIV 1/2 Ab+Ag)     Status: None   Collection Time: 07/08/18  6:44 AM  Result Value Ref Range   HIV-1 P24 Antigen - HIV24 NON REACTIVE NON REACTIVE   HIV 1/2 Antibodies NON REACTIVE NON REACTIVE   Interpretation (HIV Ag Ab)      A non reactive test result means that HIV 1 or HIV 2 antibodies and HIV 1 p24 antigen were not detected in the specimen.    Comment: Performed at [facility], [address] [place], [place] [address]  Type and screen     Status: None   Collection Time: 07/08/18  7:50 AM  Result Value Ref Range   ABO/RH(D) O POS    Antibody Screen NEG    Sample Expiration      07/11/2018 Performed at [facility], [address] [place], [place] [address]     Assessment and Plan :GIBSON TELLERIA is a 28 y.o. G3P1102 at [redacted]w[redacted]d being admitted for scheduled  cesarean section. The risks of cesarean section discussed with the patient included but were not limited to: bleeding which may require transfusion or reoperation; infection which may require antibiotics; injury to bowel, bladder, ureters or other surrounding organs; injury to the fetus; need for additional procedures including hysterectomy in the event of a life-threatening hemorrhage; placental abnormalities wth subsequent pregnancies, incisional problems, thromboembolic phenomenon and other postoperative/anesthesia complications. The patient concurred with the proposed plan,  giving informed written consent for the procedure. Patient has been NPO since last night she will remain NPO for procedure. Anesthesia and OR aware. Preoperative prophylactic antibiotics and SCDs ordered on call to the OR. To OR when ready.    Nicholaus Bloom, MD Obstetrician & Gynecologist, [facility] for [employer], [facility]

## 2018-07-08 NOTE — Anesthesia Preprocedure Evaluation (Signed)
Anesthesia Evaluation  Patient identified by MRN, date of birth, ID band Patient awake    Reviewed: Allergy & Precautions, H&P , NPO status , Patient's Chart, lab work & pertinent test results, Unable to perform ROS - Chart review only  History of Anesthesia Complications (+) PONV  Airway Mallampati: III  TM Distance: >3 FB Neck ROM: full    Dental no notable dental hx.    Pulmonary former smoker,    Pulmonary exam normal breath sounds clear to auscultation       Cardiovascular negative cardio ROS Normal cardiovascular exam Rhythm:Regular Rate:Normal     Neuro/Psych  Headaches, Anxiety Depression    GI/Hepatic Neg liver ROS, GERD  ,  Endo/Other    Renal/GU negative Renal ROS     Musculoskeletal   Abdominal (+) + obese,   Peds  Hematology negative hematology ROS (+)   Anesthesia Other Findings   Reproductive/Obstetrics (+) Pregnancy                             Anesthesia Physical  Anesthesia Plan  ASA: III  Anesthesia Plan: Spinal   Post-op Pain Management:    Induction:   PONV Risk Score and Plan:   Airway Management Planned:   Additional Equipment:   Intra-op Plan:   Post-operative Plan:   Informed Consent: I have reviewed the patients History and Physical, chart, labs and discussed the procedure including the risks, benefits and alternatives for the proposed anesthesia with the patient or authorized representative who has indicated his/her understanding and acceptance.       Plan Discussed with: CRNA and Surgeon  Anesthesia Plan Comments:         Anesthesia Quick Evaluation

## 2018-07-08 NOTE — Anesthesia Procedure Notes (Signed)
Spinal  Patient location during procedure: OB Start time: 07/08/2018 10:01 AM End time: 07/08/2018 10:06 AM Staffing Anesthesiologist: Lowella Curb, MD Performed: anesthesiologist  Preanesthetic Checklist Completed: patient identified, surgical consent, pre-op evaluation, timeout performed, IV checked, risks and benefits discussed and monitors and equipment checked Spinal Block Patient position: sitting Prep: site prepped and draped and DuraPrep Patient monitoring: heart rate, cardiac monitor, continuous pulse ox and blood pressure Approach: midline Location: L3-4 Injection technique: single-shot Needle Needle type: Pencan  Needle gauge: 24 G Needle length: 10 cm Assessment Sensory level: T4

## 2018-07-08 NOTE — Transfer of Care (Signed)
Immediate Anesthesia Transfer of Care Note  Patient: Gabrielle Roy  Procedure(s) Performed: CESAREAN SECTION (N/A Abdomen)  Patient Location: PACU  Anesthesia Type:Spinal  Level of Consciousness: awake, alert  and oriented  Airway & Oxygen Therapy: Patient Spontanous Breathing  Post-op Assessment: Report given to RN and Post -op Vital signs reviewed and stable  Post vital signs: Reviewed and stable  Last Vitals:  Vitals Value Taken Time  BP 97/52 07/08/2018 11:11 AM  Temp    Pulse 53 07/08/2018 11:13 AM  Resp 15 07/08/2018 11:13 AM  SpO2 100 % 07/08/2018 11:13 AM  Vitals shown include unvalidated device data.  Last Pain:  Vitals:   07/08/18 0742  TempSrc: Oral         Complications: No apparent anesthesia complications

## 2018-07-08 NOTE — Anesthesia Postprocedure Evaluation (Signed)
Anesthesia Post Note  Patient: Gabrielle Roy  Procedure(s) Performed: CESAREAN SECTION (N/A Abdomen)     Patient location during evaluation: PACU Anesthesia Type: Spinal Level of consciousness: oriented and awake and alert Pain management: pain level controlled Vital Signs Assessment: post-procedure vital signs reviewed and stable Respiratory status: spontaneous breathing and respiratory function stable Cardiovascular status: blood pressure returned to baseline and stable Postop Assessment: no headache, no backache and no apparent nausea or vomiting Anesthetic complications: no    Last Vitals:  Vitals:   07/08/18 1215 07/08/18 1224  BP: 113/65 108/62  Pulse: (!) 52 (!) 49  Resp: 10 18  Temp:  36.7 C  SpO2: 100% 100%    Last Pain:  Vitals:   07/08/18 1225  TempSrc:   PainSc: 0-No pain   Pain Goal:                   Lowella Curb

## 2018-07-08 NOTE — Op Note (Signed)
Gabrielle Roy PROCEDURE DATE: 07/08/2018  PREOPERATIVE DIAGNOSES: Intrauterine pregnancy at [redacted]w[redacted]d weeks gestation; previous uterine incision kerr x2  POSTOPERATIVE DIAGNOSES: The same  PROCEDURE: Repeat Low Transverse Cesarean Section  SURGEON:  Dr. Nicholaus Bloom  ASSISTANT:  Gwenevere Abbot, MD   ANESTHESIOLOGY TEAM: Anesthesiologist: Lowella Curb, MD CRNA: Graciela Husbands, CRNA  INDICATIONS: Gabrielle Roy is a 28 y.o. [phone] at [redacted]w[redacted]d here for cesarean section secondary to the indications listed under preoperative diagnoses; please see preoperative note for further details.  The risks of cesarean section were discussed with the patient including but were not limited to: bleeding which may require transfusion or reoperation; infection which may require antibiotics; injury to bowel, bladder, ureters or other surrounding organs; injury to the fetus; need for additional procedures including hysterectomy in the event of a life-threatening hemorrhage; placental abnormalities wth subsequent pregnancies, incisional problems, thromboembolic phenomenon and other postoperative/anesthesia complications.   The patient concurred with the proposed plan, giving informed written consent for the procedure.    FINDINGS:  Viable female infant in cephalic presentation.  Apgars 8 and 8.  Clear amniotic fluid.  Intact placenta, three vessel cord.  Normal uterus, fallopian tubes and ovaries bilaterally.  ANESTHESIA: Spinal INTRAVENOUS FLUIDS: 2362 ml   ESTIMATED BLOOD LOSS: 172 ml URINE OUTPUT:  375 ml SPECIMENS: Placenta sent to L&D COMPLICATIONS: None immediate  PROCEDURE IN DETAIL:  In the operating room spinal anesthesia was applied without complication. Her abdomen and vagina were prepped and draped in the usual sterile fashion. A Foley catheter was placed, draining clear urine throughout case. Timeout procedure was done. After adequate anesthesia was assured 30 mL for 0.5% Marcaine was injected into  the subcutaneous tissue at the site of her new incision. A pfannenstiel incision was made. The incision was carried down through the subcutaneous tissue to the fascia. The fascia was scored the midline and extended bilaterally. The middle 50% of the rectus muscles were separated in a transverse fashion using electrosurgical technique. Excellent hemostasis was maintained. The peritoneum was entered with bluntly and extended. The bladder blade was placed. A transverse incision was made on the well-developed lower uterine segment. The uterine incision was extended with traction on each side. Amniotomy was performed with an alice. Clear fluid was noted. The baby was delivered from a vertex presentation. The mouth and nostrils were suctioned prior to delivery of the shoulders. A single nuchal cord was reduced prior to delivery of the shoulders. The baby's cord was clamped and cut after 1 minute of delayed cord clamping and baby was transferred to the NICU personnel for routine care. The placenta was delivered intact with traction. The uterus was left in situ and the interior was cleaned with a dry lap sponge. The uterine incision was closed with 2-0 Vicryl running locking suture. Excellent hemostasis was noted. By tilting the uterus each side was able to visualize the adnexa, and they were normal. The rectus fascia rectus muscles were noted be hemostatic as well. The fascia was closed with a #1 PDS loop in a running nonlocking fashion. No defects were palpable. The subcutaneous tissue was irrigated, clean, and dried. A subcuticular closure was done with a 3-0 Vicryl suture. Steri-Strips are placed. Excellent cosmetic results were obtained. She was taken to the recovery room in stable condition. She tolerated the procedure well.    Gwenevere Abbot, MD Ob fellow, [facility] for [employer], [facility]

## 2018-07-08 NOTE — Lactation Note (Signed)
This note was copied from a baby's chart. Lactation Consultation Note  Patient Name: Gabrielle Roy OFVWA'Q Date: 07/08/2018 Reason for consult: Initial assessment;Early term 37-38.6wks;Other (Comment)(Hx of gastric bypass)  9 hours old early female who is being exclusively BF by her mother, she's a P3 and experienced BF. She was able to BF her first child for 2 months and her second one for 13 months. Mom is currently on contact precautions. She is familiar with hand expression and already able to get some drops of colostrum.   She has a Hx of gastric bypass, she had it done after her second child was born, so this is her first baby born post-surgery. Stressed the importance of taking all her regular vitamins and supplements (such as calcium, vit D and B complex) in addition to her regular Prenatal vitamins to protect the quality of her milk supply; mom voiced understanding. She has a DEBP at home.  Offered assistance with latch but mom politely declined stating baby already fed, she was asleep in her bassinet. Asked mom to call for assistance when needed. Per mom BF is going well and baby has been feeding consistently since birth. Reviewed normal newborn behavior, cluster feeding and feeding cues.   Feeding plan:  1. Encouraged mom to feed baby STS 8-12 times/24 hours or sooner if feeding cues are present 2. Hand expression and spoon feeding were also encouraged 3. Mom will resume taking her vitamins and supplements (in addition to her prenatal vitamins) as she did right after gastric by-pass surgery for as long as she's BF.  BF brochure, BF resources and feeding diary were reviewed. Parents reported all questions and concerns were answered, they're both aware of LC services and will call PRN.  Maternal Data Formula Feeding for Exclusion: No Has patient been taught Hand Expression?: Yes Does the patient have breastfeeding experience prior to this delivery?: Yes  Feeding Feeding Type:  Breast Fed   Interventions Interventions: Breast feeding basics reviewed  Lactation Tools Discussed/Used WIC Program: No   Consult Status Consult Status: PRN Follow-up type: In-patient    Annella Prowell Venetia Constable 07/08/2018, 7:34 PM

## 2018-07-08 NOTE — Lactation Note (Addendum)
This note was copied from a baby's chart. Lactation Consultation Note  Patient Name: Gabrielle Roy [id number] Date: 07/08/2018 Reason for consult: Initial assessment;Early term 37-38.6wks;Other (Comment)(Hx of gastric bypass)  Secretary Devita made a mistake and gave LC the wrong update about this patient. Mom is still BF, she'll call for assistance when needed.  Maternal Data Formula Feeding for Exclusion: No Has patient been taught Hand Expression?: Yes Does the patient have breastfeeding experience prior to this delivery?: Yes  Feeding Feeding Type: Breast Fed    Interventions Interventions: Breast feeding basics reviewed  Lactation Tools Discussed/Used WIC Program: No   Consult Status Consult Status: PRN Follow-up type: In-patient    Danilo Cappiello Venetia Constable 07/08/2018, 9:54 PM

## 2018-07-08 NOTE — Plan of Care (Signed)
Patient has ambulated to the chair and is currently sitting up.  She has also eaten without experiencing nausea and pain is under control.  Will continue to monitor.

## 2018-07-09 ENCOUNTER — Encounter (HOSPITAL_COMMUNITY): Payer: Self-pay | Admitting: Obstetrics & Gynecology

## 2018-07-09 LAB — CBC
HCT: 26.3 % — ABNORMAL LOW (ref 36.0–46.0)
Hemoglobin: 9.1 g/dL — ABNORMAL LOW (ref 12.0–15.0)
MCH: 34.5 pg — ABNORMAL HIGH (ref 26.0–34.0)
MCHC: 34.6 g/dL (ref 30.0–36.0)
MCV: 99.6 fL (ref 80.0–100.0)
Platelets: 173 10*3/uL (ref 150–400)
RBC: 2.64 MIL/uL — ABNORMAL LOW (ref 3.87–5.11)
RDW: 12.6 % (ref 11.5–15.5)
WBC: 11.7 10*3/uL — ABNORMAL HIGH (ref 4.0–10.5)
nRBC: 0 % (ref 0.0–0.2)

## 2018-07-09 MED ORDER — PROPOFOL 10 MG/ML IV BOLUS
INTRAVENOUS | Status: AC
  Filled 2018-07-09: qty 20

## 2018-07-09 MED ORDER — PHENAZOPYRIDINE HCL 200 MG PO TABS
200.0000 mg | ORAL_TABLET | Freq: Three times a day (TID) | ORAL | Status: DC | PRN
  Administered 2018-07-09 – 2018-07-10 (×2): 200 mg via ORAL
  Filled 2018-07-09 (×3): qty 1

## 2018-07-09 NOTE — Progress Notes (Signed)
Subjective: Postpartum Day #1: Cesarean Delivery Patient reports tolerating PO and no problems voiding. Denies dizziness with ambulation; breastfeeding going slowly; didn't discuss contraception. Would like to go home tomorrow if possible.  Objective: Vital signs in last 24 hours: Temp:  [97.5 F (36.4 C)-98.4 F (36.9 C)] 98.2 F (36.8 C) (05/02 0415) Pulse Rate:  [48-105] 48 (05/02 0415) Resp:  [10-18] 18 (05/02 0415) BP: (97-124)/(47-79) 112/60 (05/02 0415) SpO2:  [98 %-100 %] 98 % (05/02 0415)  Physical Exam:  General: alert and cooperative Lochia: appropriate Uterine Fundus: firm Incision: honeycomb intact, dry DVT Evaluation: No evidence of DVT seen on physical exam.  Recent Labs    07/08/18 1511 07/09/18 0457  HGB 11.1* 9.1*  HCT 31.6* 26.3*    Assessment/Plan: Status post Cesarean section. Doing well postoperatively.  Continue current care. Anticipate d/c on 07/10/18.  Arabella Merles CNM 07/09/2018, 9:12 AM

## 2018-07-09 NOTE — Progress Notes (Signed)
Pt complaining of pains with urinating/states she usually takes pyridium daily  Dr Earlene Plater notified  Order received.   States she is not sure she is voiding enough   Scanned bladder and got 204 cc   Told mom she needs to drink more and gave her some cranberry juice    Instructed pt her bladder is not full enough to cath'ed so if she has any more problems to let staff know

## 2018-07-10 MED ORDER — FERROUS SULFATE 325 (65 FE) MG PO TBEC: 325.0000 mg | DELAYED_RELEASE_TABLET | Freq: Two times a day (BID) | ORAL | 3 refills | Status: DC

## 2018-07-10 MED ORDER — SENNOSIDES-DOCUSATE SODIUM 8.6-50 MG PO TABS: 2.0000 | ORAL_TABLET | ORAL | 0 refills | Status: DC

## 2018-07-10 MED ORDER — PHENAZOPYRIDINE HCL 200 MG PO TABS
200.0000 mg | ORAL_TABLET | Freq: Three times a day (TID) | ORAL | Status: DC | PRN
  Filled 2018-07-10: qty 1

## 2018-07-10 MED ORDER — PHENAZOPYRIDINE HCL 200 MG PO TABS
200.0000 mg | ORAL_TABLET | Freq: Three times a day (TID) | ORAL | Status: DC
  Filled 2018-07-10: qty 1

## 2018-07-10 MED ORDER — OXYCODONE-ACETAMINOPHEN 5-325 MG PO TABS: 1.0000 | ORAL_TABLET | ORAL | 0 refills | Status: DC | PRN

## 2018-07-10 NOTE — Lactation Note (Signed)
This note was copied from a baby's chart. Lactation Consultation Note  Patient Name: Gabrielle Roy [id number] Date: 07/10/2018 Reason for consult: Follow-up assessment  Parents were made aware of need to pump & dump for 30 hrs after the last dose of pyridium (which will be until 0600 tomorrow morning).   Parents were provided with 9 bottles of Enfamil formula with slow-flow nipples. Parents were given volume parameters based on day of life, but encouraged to start with 15 mL and then increase as infant desires. I encouraged Mom to view the [employer]'s video on paced bottle feeding.    [name] [facility] 07/10/2018, 10:53 AM

## 2018-07-10 NOTE — Lactation Note (Signed)
This note was copied from a baby's chart. Lactation Consultation Note  Patient Name: Gabrielle Roy GOTLX'B Date: 07/10/2018 Reason for consult: Follow-up assessment  Mom says that "breastfeeding has been going pretty well." Mom thinks it may haven taken 6 days for her milk to come to volume with her last child, but at that point, she was also 125 lbs heavier (which can delay lactogenesis II).   Mom has been taking pyridium for a ureteral stent. Her last dose was soon after midnight this morning. Pyridium is contraindicated in lactation for infants less than 1 month of age. I had a lengthy conversation with Raynelle Fanning, [place]. Out of caution, Mom will pump and dump for 30 hrs (5 times the half-life). Although not likely, the RPh says that if Mom is still seeing reddish-tinged urine at the 30 hr mark, she can pump and dump until maternal urine resumes normal color.   [name] [facility] 07/10/2018, 9:17 AM

## 2018-07-10 NOTE — Discharge Instructions (Signed)
Home Care Instructions for Mom  Activity  · Gradually return to your regular activities.  · Let yourself rest. Nap while your baby sleeps.  · Avoid lifting anything that is heavier than 10 lb (4.5 kg) until your health care provider says it is okay.  · Avoid activities that take a lot of effort and energy (are strenuous) until approved by your health care provider. Walking at a slow-to-moderate pace is usually safe.  · If you had a cesarean delivery:  ? Do not vacuum, climb stairs, or drive a car for 4-6 weeks.  ? Have someone help you at home until you feel like you can do your usual activities yourself.  ? Do exercises as told by your health care provider, if this applies.  Vaginal bleeding  You may continue to bleed for 4-6 weeks after delivery. Over time, the amount of blood usually decreases and the color of the blood usually gets lighter. However, the flow of bright red blood may increase if you have been too active. If you need to use more than one pad in an hour because your pad gets soaked, or if you pass a large clot:  · Lie down.  · Raise your feet.  · Place a cold compress on your lower abdomen.  · Rest.  · Call your health care provider.  If you are breastfeeding, your period should return anytime between 8 weeks after delivery and the time that you stop breastfeeding. If you are not breastfeeding, your period should return 6-8 weeks after delivery.  Perineal care  The perineal area, or perineum, is the part of your body between your thighs. After delivery, this area needs special care. Follow these instructions as told by your health care provider.  · Take warm tub baths for 15-20 minutes.  · Use medicated pads and pain-relieving sprays and creams as told.  · Do not use tampons or douches until vaginal bleeding has stopped.  · Each time you go to the bathroom:  ? Use a peri bottle.  ? Change your pad.  ? Use towelettes in place of toilet paper until your stitches have healed.  · Do Kegel exercises  every day. Kegel exercises help to maintain the muscles that support the vagina, bladder, and bowels. You can do these exercises while you are standing, sitting, or lying down. To do Kegel exercises:  ? Tighten the muscles of your abdomen and the muscles that surround your birth canal.  ? Hold for a few seconds.  ? Relax.  ? Repeat until you have done this 5 times in a row.  · To prevent hemorrhoids from developing or getting worse:  ? Drink enough fluid to keep your urine clear or pale yellow.  ? Avoid straining when having a bowel movement.  ? Take over-the-counter medicines and stool softeners as told by your health care provider.  Breast care  · Wear a tight-fitting bra.  · Avoid taking over-the-counter pain medicine for breast discomfort.  · Apply ice to the breasts to help with discomfort as needed:  ? Put ice in a plastic bag.  ? Place a towel between your skin and the bag.  ? Leave the ice on for 20 minutes or as told by your health care provider.  Nutrition  · Eat a well-balanced diet.  · Do not try to lose weight quickly by cutting back on calories.  · Take your prenatal vitamins until your postpartum checkup or until your health care provider tells   you to stop.  Postpartum depression  You may find yourself crying for no apparent reason and unable to cope with all of the changes that come with having a newborn. This mood is called postpartum depression. Postpartum depression happens because your hormone levels change after delivery. If you have postpartum depression, get support from your partner, friends, and family. If the depression does not go away on its own after several weeks, contact your health care provider.  Breast self-exam    Do a breast self-exam each month, at the same time of the month. If you are breastfeeding, check your breasts just after a feeding, when your breasts are less full. If you are breastfeeding and your period has started, check your breasts on day 5, 6, or 7 of your  period.  Report any lumps, bumps, or discharge to your health care provider. Know that breasts are normally lumpy if you are breastfeeding. This is temporary, and it is not a health risk.  Intimacy and sexuality  Avoid sexual activity for at least 3-4 weeks after delivery or until the brownish-red vaginal flow is completely gone. If you want to avoid pregnancy, use some form of birth control. You can get pregnant after delivery, even if you have not had your period.  Contact a health care provider if:  · You feel unable to cope with the changes that a child brings to your life, and these feelings do not go away after several weeks.  · You notice a lump, a bump, or discharge on your breast.  Get help right away if:  · Blood soaks your pad in 1 hour or less.  · You have:  ? Severe pain or cramping in your lower abdomen.  ? A bad-smelling vaginal discharge.  ? A fever that is not controlled by medicine.  ? A fever, and an area of your breast is red and sore.  ? Pain or redness in your calf.  ? Sudden, severe chest pain.  ? Shortness of breath.  ? Painful or bloody urination.  ? Problems with your vision.  ? You vomit for 12 hours or longer.  ? You develop a severe headache.  ? You have serious thoughts about hurting yourself, your child, or anyone else.  This information is not intended to replace advice given to you by your health care provider. Make sure you discuss any questions you have with your health care provider.  Document Released: 02/21/2000 Document Revised: 04/21/2017 Document Reviewed: 08/27/2014  Elsevier Interactive Patient Education © 2019 [employer]

## 2018-07-10 NOTE — Lactation Note (Signed)
This note was copied from a baby's chart. Lactation Consultation Note  Patient Name: Gabrielle Roy [id number] Date: 07/10/2018   0755-Dr. Earlene Plater (OB/GYN) made aware that pyridium is contraindicated with breastfeeding. 0803-DrEarlene Plater communicated with me that she informed Mom of the above and that Mom is willing to stop taking it.   [name] [facility] 07/10/2018, 8:02 AM

## 2018-07-12 NOTE — Telephone Encounter (Signed)
-----   Message from Gwenevere Abbot, MD sent at 07/08/2018 12:54 PM EDT ----- Regarding: PP f/u Please schedule this patient for Postpartum visit in: 6 weeks with the following provider: Any provider For C/S patients schedule nurse incision check in weeks 2 weeks: yes High risk pregnancy complicated by: hx of gastric bypass, ureteral stent, preterm labor, hx of preterm delivery Delivery mode:  CS Anticipated Birth Control:  other/unsure PP Procedures needed: Incision check  Schedule Integrated BH visit: no

## 2018-07-12 NOTE — Telephone Encounter (Signed)
Left patient a message to call and schedule 2 week incision check per Dr. Aneta Mins. MyChart message sent as well.

## 2018-07-18 ENCOUNTER — Other Ambulatory Visit: Payer: Self-pay | Admitting: Student

## 2018-07-18 ENCOUNTER — Other Ambulatory Visit: Payer: Self-pay

## 2018-07-18 ENCOUNTER — Telehealth: Payer: Self-pay

## 2018-07-18 DIAGNOSIS — N200 Calculus of kidney: Secondary | ICD-10-CM

## 2018-07-18 DIAGNOSIS — Z98891 History of uterine scar from previous surgery: Secondary | ICD-10-CM

## 2018-07-18 MED ORDER — OXYCODONE HCL 5 MG PO TABS: 5.0000 mg | ORAL_TABLET | Freq: Four times a day (QID) | ORAL | 0 refills | Status: AC | PRN

## 2018-07-18 MED ORDER — IBUPROFEN 600 MG PO TABS: 600.0000 mg | ORAL_TABLET | Freq: Four times a day (QID) | ORAL | 0 refills | Status: AC | PRN

## 2018-07-18 NOTE — Telephone Encounter (Signed)
Pt called wanting refill of Percocet. We spoke to Judeth Horn, NP who is sending in a Roxicodone for her and also Rx for Ibuprofen. Pt is aware to take Ibuprofen regularly and only supplement with Roxicodone. Pt is aware to follow up with urology. Pt states she has follow up appt with Urology scheduled next week.

## 2018-07-18 NOTE — Progress Notes (Signed)
Pt called office for refill of percocet. Patient is 1 week s/p cesarean. Also has ureteral stent in place d/t kidney stone. Requesting pain meds d/t incisional pain & stone pain.  C/w Dr. Earlene Plater, will send in #30 of Roxicodone. Also sent 600 mg ibuprofen. Pt instructed to f/u with her urologist.

## 2018-07-20 ENCOUNTER — Telehealth: Payer: Self-pay

## 2018-07-20 NOTE — Telephone Encounter (Signed)
I called pt, schedule her for her 6 mo follow up with Maralyn Sago, NP per recommendations from 06/08/18 visit. Pt verbalized understanding of new appt date and time.

## 2018-07-22 ENCOUNTER — Other Ambulatory Visit: Payer: Self-pay

## 2018-07-22 ENCOUNTER — Other Ambulatory Visit

## 2018-07-22 NOTE — Progress Notes (Signed)
Pt here for incision check after C-Section on 07/08/2018. Pt denies any redness, swelling, discharge or pain from incision site. Incision site looks clean and dry with no redness or swelling. PT is aware to call office with any issues.

## 2018-07-26 ENCOUNTER — Encounter: Payer: Self-pay | Admitting: Obstetrics & Gynecology

## 2018-08-02 ENCOUNTER — Other Ambulatory Visit: Payer: Self-pay | Admitting: Urology

## 2018-08-05 ENCOUNTER — Other Ambulatory Visit: Payer: Self-pay

## 2018-08-05 ENCOUNTER — Encounter (HOSPITAL_BASED_OUTPATIENT_CLINIC_OR_DEPARTMENT_OTHER): Payer: Self-pay

## 2018-08-05 NOTE — Progress Notes (Signed)
SPOKE W/  Lulla     SCREENING SYMPTOMS OF COVID 19:   COUGH--NO  RUNNY NOSE--- NO  SORE THROAT---NO  NASAL CONGESTION----NO  SNEEZING----NO  SHORTNESS OF BREATH---NO  DIFFICULTY BREATHING---NO  TEMP >100.0 -----NO  UNEXPLAINED BODY ACHES------NO  CHILLS -------- NO  HEADACHES ---------NO  LOSS OF SMELL/ TASTE --------NO    HAVE YOU OR ANY FAMILY MEMBER TRAVELLED PAST 14 DAYS OUT OF THE   COUNTY---LIVES IN [place]----NO COUNTRY----NO  HAVE YOU OR ANY FAMILY MEMBER BEEN EXPOSED TO ANYONE WITH COVID 19? NO

## 2018-08-05 NOTE — Progress Notes (Addendum)
Spoke with:  Loy NPO:  After Midnight, no gum, candy, or mints   Arrival time:  [id number] Labs: (COVID 08/08/2018, EKG 01/29/2018 in epic) AM medications: None Pre op orders: Yes Ride home:  Kathlene November (dad) [phone]

## 2018-08-08 ENCOUNTER — Other Ambulatory Visit (HOSPITAL_COMMUNITY)
Admission: RE | Admit: 2018-08-08 | Discharge: 2018-08-08 | Disposition: A | Discharge status: Home / Self Care | Source: Ambulatory Visit | Attending: Urology | Admitting: Urology

## 2018-08-08 DIAGNOSIS — Z1159 Encounter for screening for other viral diseases: Secondary | ICD-10-CM | POA: Diagnosis present

## 2018-08-09 ENCOUNTER — Other Ambulatory Visit: Payer: Self-pay

## 2018-08-09 ENCOUNTER — Emergency Department (HOSPITAL_COMMUNITY)

## 2018-08-09 ENCOUNTER — Inpatient Hospital Stay (HOSPITAL_COMMUNITY)
Admission: EM | Admit: 2018-08-09 | Discharge: 2018-08-10 | DRG: 776 | Disposition: A | Discharge status: Home / Self Care | Attending: Internal Medicine | Admitting: Internal Medicine

## 2018-08-09 ENCOUNTER — Encounter (HOSPITAL_COMMUNITY): Payer: Self-pay | Admitting: Emergency Medicine

## 2018-08-09 DIAGNOSIS — Z885 Allergy status to narcotic agent status: Secondary | ICD-10-CM | POA: Diagnosis not present

## 2018-08-09 DIAGNOSIS — O9989 Other specified diseases and conditions complicating pregnancy, childbirth and the puerperium: Secondary | ICD-10-CM | POA: Diagnosis present

## 2018-08-09 DIAGNOSIS — Z888 Allergy status to other drugs, medicaments and biological substances status: Secondary | ICD-10-CM | POA: Diagnosis not present

## 2018-08-09 DIAGNOSIS — O99845 Bariatric surgery status complicating the puerperium: Secondary | ICD-10-CM | POA: Diagnosis present

## 2018-08-09 DIAGNOSIS — Z87442 Personal history of urinary calculi: Secondary | ICD-10-CM

## 2018-08-09 DIAGNOSIS — N136 Pyonephrosis: Secondary | ICD-10-CM | POA: Diagnosis present

## 2018-08-09 DIAGNOSIS — Z91048 Other nonmedicinal substance allergy status: Secondary | ICD-10-CM

## 2018-08-09 DIAGNOSIS — Z79899 Other long term (current) drug therapy: Secondary | ICD-10-CM | POA: Diagnosis not present

## 2018-08-09 DIAGNOSIS — G43019 Migraine without aura, intractable, without status migrainosus: Secondary | ICD-10-CM | POA: Diagnosis present

## 2018-08-09 DIAGNOSIS — Z960 Presence of urogenital implants: Secondary | ICD-10-CM | POA: Diagnosis present

## 2018-08-09 DIAGNOSIS — R109 Unspecified abdominal pain: Secondary | ICD-10-CM | POA: Diagnosis present

## 2018-08-09 DIAGNOSIS — N1330 Unspecified hydronephrosis: Secondary | ICD-10-CM

## 2018-08-09 DIAGNOSIS — Z1159 Encounter for screening for other viral diseases: Secondary | ICD-10-CM | POA: Diagnosis not present

## 2018-08-09 DIAGNOSIS — A499 Bacterial infection, unspecified: Secondary | ICD-10-CM

## 2018-08-09 DIAGNOSIS — N10 Acute tubulo-interstitial nephritis: Secondary | ICD-10-CM | POA: Diagnosis not present

## 2018-08-09 DIAGNOSIS — N390 Urinary tract infection, site not specified: Secondary | ICD-10-CM

## 2018-08-09 DIAGNOSIS — O8621 Infection of kidney following delivery: Secondary | ICD-10-CM | POA: Diagnosis present

## 2018-08-09 DIAGNOSIS — R509 Fever, unspecified: Secondary | ICD-10-CM

## 2018-08-09 DIAGNOSIS — Z87891 Personal history of nicotine dependence: Secondary | ICD-10-CM

## 2018-08-09 LAB — BASIC METABOLIC PANEL
Anion gap: 8 (ref 5–15)
BUN: 11 mg/dL (ref 6–20)
CO2: 24 mmol/L (ref 22–32)
Calcium: 8.6 mg/dL — ABNORMAL LOW (ref 8.9–10.3)
Chloride: 106 mmol/L (ref 98–111)
Creatinine, Ser: 0.63 mg/dL (ref 0.44–1.00)
GFR calc Af Amer: >60 mL/min (ref >60)
GFR calc non Af Amer: >60 mL/min (ref >60)
Glucose, Bld: 99 mg/dL (ref 70–99)
Potassium: 3.5 mmol/L (ref 3.5–5.1)
Sodium: 138 mmol/L (ref 135–145)

## 2018-08-09 LAB — CBC WITH DIFFERENTIAL/PLATELET
Abs Immature Granulocytes: 0.02 10*3/uL (ref 0.00–0.07)
Basophils Absolute: 0 10*3/uL (ref 0.0–0.1)
Basophils Relative: 1 %
Eosinophils Absolute: 0.1 10*3/uL (ref 0.0–0.5)
Eosinophils Relative: 1 %
HCT: 36.7 % (ref 36.0–46.0)
Hemoglobin: 11.7 g/dL — ABNORMAL LOW (ref 12.0–15.0)
Immature Granulocytes: 0 %
Lymphocytes Relative: 21 %
Lymphs Abs: 1.7 10*3/uL (ref 0.7–4.0)
MCH: 32.3 pg (ref 26.0–34.0)
MCHC: 31.9 g/dL (ref 30.0–36.0)
MCV: 101.4 fL — ABNORMAL HIGH (ref 80.0–100.0)
Monocytes Absolute: 0.7 10*3/uL (ref 0.1–1.0)
Monocytes Relative: 8 %
Neutro Abs: 5.3 10*3/uL (ref 1.7–7.7)
Neutrophils Relative %: 69 %
Platelets: 193 10*3/uL (ref 150–400)
RBC: 3.62 MIL/uL — ABNORMAL LOW (ref 3.87–5.11)
RDW: 11.2 % — ABNORMAL LOW (ref 11.5–15.5)
WBC: 7.8 10*3/uL (ref 4.0–10.5)
nRBC: 0 % (ref 0.0–0.2)

## 2018-08-09 LAB — URINALYSIS, ROUTINE W REFLEX MICROSCOPIC
Bilirubin Urine: NEGATIVE
Glucose, UA: NEGATIVE mg/dL
Ketones, ur: NEGATIVE mg/dL
Nitrite: POSITIVE — AB
Protein, ur: 100 mg/dL — AB
Specific Gravity, Urine: 1.008 (ref 1.005–1.030)
WBC, UA: >50 WBC/hpf — ABNORMAL HIGH (ref 0–5)
pH: 6 (ref 5.0–8.0)

## 2018-08-09 LAB — LACTIC ACID, PLASMA: Lactic Acid, Venous: 0.8 mmol/L (ref 0.5–1.9)

## 2018-08-09 LAB — SARS CORONAVIRUS 2 BY RT PCR (HOSPITAL ORDER, PERFORMED IN ~~LOC~~ HOSPITAL LAB): SARS Coronavirus 2: NEGATIVE

## 2018-08-09 LAB — I-STAT BETA HCG BLOOD, ED (MC, WL, AP ONLY): I-stat hCG, quantitative: <5 m[IU]/mL (ref <5)

## 2018-08-09 MED ORDER — SODIUM CHLORIDE 0.9 % IV SOLN
INTRAVENOUS | Status: DC
  Administered 2018-08-09 – 2018-08-10 (×3): via INTRAVENOUS

## 2018-08-09 MED ORDER — ENOXAPARIN SODIUM 40 MG/0.4ML ~~LOC~~ SOLN
40.0000 mg | SUBCUTANEOUS | Status: DC
  Administered 2018-08-09: 40 mg via SUBCUTANEOUS
  Filled 2018-08-09: qty 0.4

## 2018-08-09 MED ORDER — ONDANSETRON HCL 4 MG/2ML IJ SOLN
4.0000 mg | Freq: Once | INTRAMUSCULAR | Status: AC
  Administered 2018-08-09: 4 mg via INTRAVENOUS
  Filled 2018-08-09: qty 2

## 2018-08-09 MED ORDER — HYDROMORPHONE HCL 1 MG/ML IJ SOLN
1.0000 mg | Freq: Once | INTRAMUSCULAR | Status: AC
  Administered 2018-08-09: 1 mg via INTRAVENOUS
  Filled 2018-08-09: qty 1

## 2018-08-09 MED ORDER — SODIUM CHLORIDE 0.9 % IV SOLN
1.0000 g | INTRAVENOUS | Status: DC
  Administered 2018-08-10: 1 g via INTRAVENOUS
  Filled 2018-08-09: qty 1

## 2018-08-09 MED ORDER — ONDANSETRON HCL 4 MG PO TABS: 4.0000 mg | ORAL_TABLET | Freq: Four times a day (QID) | ORAL | Status: DC | PRN

## 2018-08-09 MED ORDER — SODIUM CHLORIDE 0.9 % IV BOLUS
1000.0000 mL | Freq: Once | INTRAVENOUS | Status: AC
  Administered 2018-08-09: 1000 mL via INTRAVENOUS

## 2018-08-09 MED ORDER — SODIUM CHLORIDE 0.9 % IV SOLN
1.0000 g | Freq: Once | INTRAVENOUS | Status: AC
  Administered 2018-08-09: 1 g via INTRAVENOUS
  Filled 2018-08-09: qty 10

## 2018-08-09 MED ORDER — ACETAMINOPHEN 650 MG RE SUPP: 650.0000 mg | Freq: Four times a day (QID) | RECTAL | Status: DC | PRN

## 2018-08-09 MED ORDER — METOCLOPRAMIDE HCL 5 MG/ML IJ SOLN
10.0000 mg | Freq: Once | INTRAMUSCULAR | Status: AC
  Administered 2018-08-09: 08:00:00 10 mg via INTRAVENOUS
  Filled 2018-08-09: qty 2

## 2018-08-09 MED ORDER — HYDROMORPHONE HCL 1 MG/ML IJ SOLN
0.5000 mg | INTRAMUSCULAR | Status: DC | PRN
  Administered 2018-08-09 (×3): 0.5 mg via INTRAVENOUS
  Filled 2018-08-09 (×3): qty 0.5

## 2018-08-09 MED ORDER — KETOROLAC TROMETHAMINE 30 MG/ML IJ SOLN
15.0000 mg | Freq: Once | INTRAMUSCULAR | Status: AC
  Administered 2018-08-09: 15 mg via INTRAVENOUS
  Filled 2018-08-09: qty 1

## 2018-08-09 MED ORDER — ACETAMINOPHEN 325 MG PO TABS: 650.0000 mg | ORAL_TABLET | Freq: Once | ORAL | Status: DC

## 2018-08-09 MED ORDER — TAMSULOSIN HCL 0.4 MG PO CAPS
0.4000 mg | ORAL_CAPSULE | Freq: Every day | ORAL | Status: DC
  Administered 2018-08-09 – 2018-08-10 (×2): 0.4 mg via ORAL
  Filled 2018-08-09 (×2): qty 1

## 2018-08-09 MED ORDER — POLYETHYLENE GLYCOL 3350 17 G PO PACK
17.0000 g | PACK | Freq: Every day | ORAL | Status: DC
  Administered 2018-08-09 – 2018-08-10 (×2): 17 g via ORAL
  Filled 2018-08-09: qty 1

## 2018-08-09 MED ORDER — ACETAMINOPHEN 325 MG PO TABS
650.0000 mg | ORAL_TABLET | Freq: Four times a day (QID) | ORAL | Status: DC | PRN
  Administered 2018-08-09: 650 mg via ORAL
  Filled 2018-08-09: qty 2

## 2018-08-09 MED ORDER — ONDANSETRON HCL 4 MG/2ML IJ SOLN: 4.0000 mg | Freq: Four times a day (QID) | INTRAMUSCULAR | Status: DC | PRN

## 2018-08-09 MED ORDER — IBUPROFEN 200 MG PO TABS
600.0000 mg | ORAL_TABLET | Freq: Three times a day (TID) | ORAL | Status: DC | PRN
  Administered 2018-08-09 – 2018-08-10 (×2): 800 mg via ORAL
  Filled 2018-08-09 (×2): qty 4

## 2018-08-09 MED ORDER — KETOROLAC TROMETHAMINE 10 MG PO TABS
10.0000 mg | ORAL_TABLET | Freq: Four times a day (QID) | ORAL | Status: DC | PRN
  Filled 2018-08-09: qty 1

## 2018-08-09 NOTE — ED Provider Notes (Signed)
[facility]-EMERGENCY DEPT Provider Note   CSN: [id number] Arrival date & time: 08/09/18  0120    History   Chief Complaint Chief Complaint  Patient presents with   Flank Pain    left    HPI Gabrielle Roy is a 28 y.o. female.     The history is provided by the patient and medical records.  Flank Pain     28 y.o. F with hx of anxiety, depression, migraines, kidney stones with left ureteral stent in place since February 2020, presenting to the ED for left flank pain and fever.  Patient reports stent has been in place for several months due to pregnancy.  She had c-section on 07/08/18, this went well without noted complications.  Patient states she is actually scheduled for surgery tomorrow with Dr. Marlou PorchHerrick to have stent removed and possible laser lithotripsy and new stent placement.  States over the past 24 hours she has started developing fever, chills, and poor appetite.  T-max at home 102F, she did take 500mg  Tylenol prior to arrival.  States she was previously taking oxycodone for pain, but was switched to tramadol as she is currently breast-feeding.  States she has not had a menstrual cycle since her C-section.  She did her prescreening yesterday for surgery which included a COVID test.  She denies any known sick exposures, her family has been well.  Past Medical History:  Diagnosis Date   Anxiety    Common migraine with intractable migraine 03/10/2018   Depression    Epigastric hernia    History of kidney stones    History of morbid obesity    Hx of pre-eclampsia in prior pregnancy, currently pregnant, first trimester    Hydronephrosis, left    Bilateral   Kidney stone    Stent placed in Feb 2017   PONV (postoperative nausea and vomiting)    nausea during last c section   Supervision of normal pregnancy, antepartum 10/04/2015    [facility]  [facility] - transfer from Dr. Shawnie Ponsorn Prenatal Labs Dating  Blood type:    Genetic Screen 1 Screen:     AFP:     Quad:     NIPS: Antibody:  Anatomic US  Nml fetal anatomy @ 18 wks; low lying placenta > resolved @23  wks Rubella:   GTT Early:               Third trimester:  RPR:    Flu vaccine  Declined HBsAg:    TDaP vaccine                                               Rhogam: HIV:    Baby Food  Breastfeed                                            GBS: (For PCN allergy, check sensitivities) Contraception  Pap: Circumcision  n/a female ([facility] ultrasound)  Pediatrician   Support Person  [name] (husband)      Patient Active Problem List   Diagnosis Date Noted   History of cesarean section 07/08/2018   Anemia in pregnancy 07/08/2018   Late Entry into [facility] opt schedule to limit social distancing due to COVID-19 05/30/2018   History of  preterm delivery 05/25/2018   Threatened premature labor in third trimester 05/04/2018   Renal stones 04/25/2018   Urinary urgency 04/25/2018   Symphysis pubis disruption, initial encounter 03/30/2018   Back pain in pregnancy 03/28/2018   Common migraine with intractable migraine 03/10/2018   History of gestational hypertension 12/29/2017   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   Previous cesarean section complicating pregnancy, antepartum condition or complication 12/29/2017   Postoperative intestinal malabsorption 06/07/2017   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   Former smoker 05/06/2017   Obesity in pregnancy 11/06/2015   Supervision of normal pregnancy 10/04/2015   Anxiety state 09/29/2013   Depressive disorder, not elsewhere classified 09/29/2013   Gastroesophageal reflux disease without esophagitis 08/16/2013    Past Surgical History:  Procedure Laterality Date   CESAREAN SECTION     CESAREAN SECTION N/A 02/20/2016   Procedure: REPEAT CESAREAN SECTION;  Surgeon: Hermina Staggers, MD;  Location: [facility];  Service: Obstetrics;  Laterality: N/A;   CESAREAN SECTION N/A 07/08/2018   Procedure: CESAREAN  SECTION;  Surgeon: Allie Bossier, MD;  Location: [facility] LD ORS;  Service: Obstetrics;  Laterality: N/A;   CYSTOSCOPY W/ URETERAL STENT PLACEMENT Left 04/26/2018   Procedure: CYSTOSCOPY WITH RETROGRADE PYELOGRAM/URETERAL STENT PLACEMENT;  Surgeon: Crist Fat, MD;  Location: [facility];  Service: Urology;  Laterality: Left;   CYSTOSCOPY WITH STENT PLACEMENT     FINGER SURGERY     GASTRIC BYPASS       OB History    Gravida  3   Para  3   Term  2   Preterm  1   AB      Living  3     SAB      TAB      Ectopic      Multiple  0   Live Births  3            Home Medications    Prior to Admission medications   Medication Sig Start Date End Date Taking? Authorizing Provider  Catheters (SPECIMEN CATHETER FEMALE) MISC 1 Device by Does not apply route 4 (four) times daily. Pt is to use catheter QID for self cath 06/13/18   Lesly Dukes, MD  ibuprofen (ADVIL) 800 MG tablet Take 800 mg by mouth every 8 (eight) hours as needed.    [provider]  Prenatal Vit-Fe Fumarate-FA (PRENATAL VITAMIN PO) Take 1 tablet by mouth daily.     [provider]  traMADol (ULTRAM) 50 MG tablet Take 50 mg by mouth 2 (two) times daily as needed for severe pain.    [provider]    Family History Family History  Problem Relation Age of Onset   Hypertension Father    Heart disease Father    Hearing loss Father     Social History Social History   Tobacco Use   Smoking status: Former Smoker   Smokeless tobacco: Never Used  Substance Use Topics   Alcohol use: No   Drug use: No     Allergies   Alcohol; Flagyl [metronidazole]; and Hydrocodone-acetaminophen   Review of Systems Review of Systems  Genitourinary: Positive for flank pain.  All other systems reviewed and are negative.    Physical Exam Updated Vital Signs BP 121/87 (BP Location: Left Arm)    Pulse 97    Temp 99.6 F (37.6 C) (Oral)    Resp 16    Ht 5\' 1"  (1.549 m)    Wt  83 kg     SpO2 99%    BMI 34.58 kg/m   Physical Exam Vitals signs and nursing note reviewed.  Constitutional:      Appearance: She is well-developed.     Comments: Warm to touch, clammy  HENT:     Head: Normocephalic and atraumatic.  Eyes:     Conjunctiva/sclera: Conjunctivae normal.     Pupils: Pupils are equal, round, and reactive to light.  Neck:     Musculoskeletal: Normal range of motion.  Cardiovascular:     Rate and Rhythm: Normal rate and regular rhythm.     Heart sounds: Normal heart sounds.  Pulmonary:     Effort: Pulmonary effort is normal.     Breath sounds: Normal breath sounds.  Abdominal:     General: Bowel sounds are normal.     Palpations: Abdomen is soft.     Tenderness: There is left CVA tenderness.  Musculoskeletal: Normal range of motion.  Skin:    General: Skin is warm and dry.  Neurological:     Mental Status: She is alert and oriented to person, place, and time.      ED Treatments / Results  Labs (all labs ordered are listed, but only abnormal results are displayed) Labs Reviewed  CBC WITH DIFFERENTIAL/PLATELET - Abnormal; Notable for the following components:      Result Value   RBC 3.62 (*)    Hemoglobin 11.7 (*)    MCV 101.4 (*)    RDW 11.2 (*)    All other components within normal limits  BASIC METABOLIC PANEL - Abnormal; Notable for the following components:   Calcium 8.6 (*)    All other components within normal limits  URINALYSIS, ROUTINE W REFLEX MICROSCOPIC - Abnormal; Notable for the following components:   APPearance TURBID (*)    Hgb urine dipstick LARGE (*)    Protein, ur 100 (*)    Nitrite POSITIVE (*)    Leukocytes,Ua LARGE (*)    WBC, UA >50 (*)    Bacteria, UA MANY (*)    Non Squamous Epithelial 0-5 (*)    All other components within normal limits  CULTURE, BLOOD (ROUTINE X 2)  CULTURE, BLOOD (ROUTINE X 2)  URINE CULTURE  LACTIC ACID, PLASMA  I-STAT BETA HCG BLOOD, ED ([facility], [facility], [facility] ONLY)    EKG None  Radiology Dg Abd  1 View  Result Date: 08/09/2018 CLINICAL DATA:  Ureteral stent EXAM: ABDOMEN - 1 VIEW COMPARISON:  None. FINDINGS: There is a left-sided double-J ureteral stent in place. The alignment appears grossly appropriate. Exact positioning cannot be determined on this exam. There is a moderate amount of stool in the colon. The bowel gas pattern is nonobstructive. Multiple surgical staples project over the left upper quadrant. IMPRESSION: Grossly well-positioned ureteral stent on the left. Electronically Signed   By: Katherine Mantle M.D.   On: 08/09/2018 03:21   Ct Renal Stone Study  Result Date: 08/09/2018 CLINICAL DATA:  Fever and left flank pain EXAM: CT ABDOMEN AND PELVIS WITHOUT CONTRAST TECHNIQUE: Multidetector CT imaging of the abdomen and pelvis was performed following the standard protocol without IV contrast. COMPARISON:  Radiography from earlier today. 04/27/2015 abdominal CT FINDINGS: Lower chest:  No contributory findings. Hepatobiliary: No focal liver abnormality.No evidence of biliary obstruction or stone. Pancreas: Unremarkable. Spleen: Unremarkable. Adrenals/Urinary Tract: Negative adrenals. Mild left hydronephrosis despite a well-positioned internal ureteral stent. Thread/wire again seen at the lower stent which is straight. No underlying stone is seen. Unremarkable  bladder. Stomach/Bowel:  No obstruction. No appendicitis.  Gastric bypass. Vascular/Lymphatic: No acute vascular abnormality. No mass or adenopathy. Reproductive:Negative.  Cesarean section scar. Other: No ascites or pneumoperitoneum. Fatty midline and infraumbilical hernia. Musculoskeletal: No acute abnormalities. IMPRESSION: 1. Mild left hydronephrosis despite stable positioning of the ureteral stent. No visible urolithiasis. 2. Otherwise stable, as described. Electronically Signed   By: Marnee Spring M.D.   On: 08/09/2018 06:02    Procedures Procedures (including critical care time)  Medications Ordered in ED Medications    HYDROmorphone (DILAUDID) injection 1 mg (has no administration in time range)  ondansetron (ZOFRAN) injection 4 mg (has no administration in time range)  ondansetron (ZOFRAN) injection 4 mg (4 mg Intravenous Given 08/09/18 0238)  HYDROmorphone (DILAUDID) injection 1 mg (1 mg Intravenous Given 08/09/18 0238)  cefTRIAXone (ROCEPHIN) 1 g in sodium chloride 0.9 % 100 mL IVPB (0 g Intravenous Stopped 08/09/18 0330)  HYDROmorphone (DILAUDID) injection 1 mg (1 mg Intravenous Given 08/09/18 0411)     Initial Impression / Assessment and Plan / ED Course  I have reviewed the triage vital signs and the nursing notes.  Pertinent labs & imaging results that were available during my care of the patient were reviewed by me and considered in my medical decision making (see chart for details).  28 year old female here with left flank pain and fever.  She has ureteral stent that is been in place for a few months now due to pregnancy.  She underwent C-section on 07/08/2018 without any noted complications.  She is actually due for stent removal with possible laser lithotripsy and new stent placement tomorrow with Dr. Marlou Porch.  States over the past 24 hours she has developed fever, chills, and sweats.  T-max 102F.  She did take Tylenol to arrival and continues with low-grade fever here.  She does feel warm to the touch and is clammy.  Urine sample at the bedside appears grossly cloudy given will send basic labs, lactate, blood cultures, urinalysis with culture.  Will obtain KUB to recheck stent placement.  Given dose of IV Rocephin as well as pain and nausea medication.  Labs as above, UA grossly infected with many bacteria and positive nitrates.  Remainder of labs are overall reassuring with normal white blood cell count normal lactate.  Blood cultures are pending.  KUB with well-positioned stent.  Will discuss with urology for recommendations as she is scheduled for surgery tomorrow.  Talked with urology fellow, Dr.  Leone Payor-- wants CT renal study as she did not have one previously (this was due to pregnancy).  She is concerned for possible stent failure vs stone on right, however no pain on right side currently.  Requested call back once done.  6:26 AM CT grossly normal aside from very mild left-sided hydronephrosis.  This was present on prior ultrasounds as well.  I have relayed this to urology fellow through OR nurse, she will come and evaluate patient once she finishes in the OR.  Patient is still very uncomfortable here and recurrently nauseated.  We will attempt to keep her comfortable.  I anticipate she will need admission for at least symptomatic control and/or continued IV antibiotics.  Final Clinical Impressions(s) / ED Diagnoses   Final diagnoses:  Left flank pain  Fever in adult  Bacterial urinary infection    ED Discharge Orders    None       Garlon Hatchet, PA-C 08/09/18 0630    Shon Baton, MD 08/09/18 [phone]

## 2018-08-09 NOTE — ED Notes (Signed)
Pt provided breakfast tray.

## 2018-08-09 NOTE — ED Notes (Signed)
Rpt given to [facility]

## 2018-08-09 NOTE — ED Notes (Signed)
Pt has urine specimen at bedside if needed.  

## 2018-08-09 NOTE — ED Triage Notes (Signed)
Pt arriving with left sided flank pain. Pt has surgery scheduled for tomorrow to remove stents in kidneys and to remove a kidney stone. Pt reports having fever of 102 at home but took tylenol and fever has come down since. Pt also reports decreased urination and nausea.

## 2018-08-09 NOTE — Discharge Instructions (Addendum)
Flomax Stent removal thurs in office

## 2018-08-09 NOTE — ED Notes (Signed)
ED TO INPATIENT HANDOFF REPORT  ED Nurse Name and Phone #: Lona Kettle Name/Age/Gender Gabrielle Roy 28 y.o. female Room/Bed: WA15/WA15  Code Status   Code Status: Prior  Home/SNF/Other Home Patient oriented to: self, place, time and situation Is this baseline? Yes   Triage Complete: Triage complete  Chief Complaint Fever(102); Possible Kidney Infection  Triage Note Pt arriving with left sided flank pain. Pt has surgery scheduled for tomorrow to remove stents in kidneys and to remove a kidney stone. Pt reports having fever of 102 at home but took tylenol and fever has come down since. Pt also reports decreased urination and nausea.    Allergies Allergies  Allergen Reactions  . Alcohol Hives    Allergic to alcohol containing products including cough medicines that contain alcohol  . Flagyl [Metronidazole] Other (See Comments)    Facial flushing  . Hydrocodone-Acetaminophen Nausea And Vomiting    Level of Care/Admitting Diagnosis ED Disposition    ED Disposition Condition Comment   Admit  Hospital Area: [facility] [[id number]]  Level of Care: Med-Surg [16]  Covid Evaluation: Screening Protocol (No Symptoms)  Diagnosis: Acute pyelonephritis [161096]  Admitting Physician: Narda Bonds [phone]  Attending Physician: Narda Bonds [phone]  Estimated length of stay: past midnight tomorrow  Certification:: I certify this patient will need inpatient services for at least 2 midnights  PT Class (Do Not Modify): Inpatient [101]  PT Acc Code (Do Not Modify): Private [1]       B Medical/Surgery History Past Medical History:  Diagnosis Date  . Anxiety   . Common migraine with intractable migraine 03/10/2018  . Depression   . Epigastric hernia   . History of kidney stones   . History of morbid obesity   . Hx of pre-eclampsia in prior pregnancy, currently pregnant, first trimester   . Hydronephrosis, left    Bilateral  . Kidney stone    Stent placed in  Feb 2017  . PONV (postoperative nausea and vomiting)    nausea during last c section  . Supervision of normal pregnancy, antepartum 10/04/2015    Clinic  Ensign - transfer from Dr. Shawnie Pons Prenatal Labs Dating  Blood type:    Genetic Screen 1 Screen:    AFP:     Quad:     NIPS: Antibody:  Anatomic [place]  Nml fetal anatomy @ 18 wks; low lying placenta > resolved  wks Rubella:   GTT Early:               Third trimester:  RPR:    Flu vaccine  Declined HBsAg:    TDaP vaccine                                               Rhogam: HIV:    Baby Food  Breastfeed                                            GBS: (For PCN allergy, check sensitivities) Contraception  Pap: Circumcision  n/a female ([facility] ultrasound)  Pediatrician   Support Person  [name] (husband)     Past Surgical History:  Procedure Laterality Date  . CESAREAN SECTION    . CESAREAN SECTION N/A 02/20/2016  Procedure: REPEAT CESAREAN SECTION;  Surgeon: Hermina Staggers, MD;  Location: [facility];  Service: Obstetrics;  Laterality: N/A;  . CESAREAN SECTION N/A 07/08/2018   Procedure: CESAREAN SECTION;  Surgeon: Allie Bossier, MD;  Location: [facility] LD ORS;  Service: Obstetrics;  Laterality: N/A;  . CYSTOSCOPY W/ URETERAL STENT PLACEMENT Left 04/26/2018   Procedure: CYSTOSCOPY WITH RETROGRADE PYELOGRAM/URETERAL STENT PLACEMENT;  Surgeon: Crist Fat, MD;  Location: [facility] ORS;  Service: Urology;  Laterality: Left;  . CYSTOSCOPY WITH STENT PLACEMENT    . FINGER SURGERY    . GASTRIC BYPASS       A IV Location/Drains/Wounds Patient Lines/Drains/Airways Status   Active Line/Drains/Airways    Name:   Placement date:   Placement time:   Site:   Days:   Peripheral IV 08/09/18 Left Antecubital   08/09/18    0237    Antecubital   less than 1   Peripheral IV 08/09/18 Left Hand   08/09/18    0237    Hand   less than 1   Ureteral Drain/Stent Left ureter 5 Fr.   04/26/18    1737    Left ureter   105   Incision (Closed) 02/20/16 Perineum    02/20/16    1300     901   Incision (Closed) 02/20/16 Abdomen   02/20/16    1300     901   Incision (Closed) 04/26/18 Abdomen Left   04/26/18    1743     105   Incision (Closed) 07/08/18 Abdomen   07/08/18    1035     32   Incision (Closed) 07/08/18 Perineum   07/08/18    1035     32          Intake/Output Last 24 hours  Intake/Output Summary (Last 24 hours) at 08/09/2018 1059 Last data filed at 08/09/2018 0936 Gross per 24 hour  Intake 1100 ml  Output -  Net 1100 ml    Labs/Imaging Results for orders placed or performed during the hospital encounter of 08/09/18 (from the past 48 hour(s))  CBC with Differential     Status: Abnormal   Collection Time: 08/09/18  2:15 AM  Result Value Ref Range   WBC 7.8 4.0 - 10.5 K/uL   RBC 3.62 (L) 3.87 - 5.11 MIL/uL   Hemoglobin 11.7 (L) 12.0 - 15.0 g/dL   HCT 17.7 11.6 - 57.9 %   MCV 101.4 (H) 80.0 - 100.0 fL   MCH 32.3 26.0 - 34.0 pg   MCHC 31.9 30.0 - 36.0 g/dL   RDW 03.8 (L) 33.3 - 83.2 %   Platelets 193 150 - 400 K/uL   nRBC 0.0 0.0 - 0.2 %   Neutrophils Relative % 69 %   Neutro Abs 5.3 1.7 - 7.7 K/uL   Lymphocytes Relative 21 %   Lymphs Abs 1.7 0.7 - 4.0 K/uL   Monocytes Relative 8 %   Monocytes Absolute 0.7 0.1 - 1.0 K/uL   Eosinophils Relative 1 %   Eosinophils Absolute 0.1 0.0 - 0.5 K/uL   Basophils Relative 1 %   Basophils Absolute 0.0 0.0 - 0.1 K/uL   Immature Granulocytes 0 %   Abs Immature Granulocytes 0.02 0.00 - 0.07 K/uL    Comment: Performed at [facility], [address], [place], [place] [address]  Basic metabolic panel     Status: Abnormal   Collection Time: 08/09/18  2:15 AM  Result Value Ref Range  Sodium 138 135 - 145 mmol/L   Potassium 3.5 3.5 - 5.1 mmol/L   Chloride 106 98 - 111 mmol/L   CO2 24 22 - 32 mmol/L   Glucose, Bld 99 70 - 99 mg/dL   BUN 11 6 - 20 mg/dL   Creatinine, Ser 1.61 0.44 - 1.00 mg/dL   Calcium 8.6 (L) 8.9 - 10.3 mg/dL   GFR calc non Af Amer >60 >60 mL/min   GFR  calc Af Amer >60 >60 mL/min   Anion gap 8 5 - 15    Comment: Performed at [facility], [address] [place], [place] [address]  Lactic acid, plasma     Status: None   Collection Time: 08/09/18  2:15 AM  Result Value Ref Range   Lactic Acid, Venous 0.8 0.5 - 1.9 mmol/L    Comment: Performed at [facility], [address], [place], [place] [address]  Urinalysis, Routine w reflex microscopic     Status: Abnormal   Collection Time: 08/09/18  2:15 AM  Result Value Ref Range   Color, Urine YELLOW YELLOW   APPearance TURBID (A) CLEAR   Specific Gravity, Urine 1.008 1.005 - 1.030   pH 6.0 5.0 - 8.0   Glucose, UA NEGATIVE NEGATIVE mg/dL   Hgb urine dipstick LARGE (A) NEGATIVE   Bilirubin Urine NEGATIVE NEGATIVE   Ketones, ur NEGATIVE NEGATIVE mg/dL   Protein, ur 119 (A) NEGATIVE mg/dL   Nitrite POSITIVE (A) NEGATIVE   Leukocytes,Ua LARGE (A) NEGATIVE   RBC / HPF 21-50 0 - 5 RBC/hpf   WBC, UA >50 (H) 0 - 5 WBC/hpf   Bacteria, UA MANY (A) NONE SEEN   Squamous Epithelial / LPF 0-5 0 - 5   WBC Clumps PRESENT    Mucus PRESENT    Non Squamous Epithelial 0-5 (A) NONE SEEN    Comment: Performed at [facility], [address], [place], [place] [address]  I-Stat Beta hCG blood, ED ([facility], [facility], [facility] only)     Status: None   Collection Time: 08/09/18  2:45 AM  Result Value Ref Range   I-stat hCG, quantitative <5.0 <5 mIU/mL   Comment 3            Comment:   GEST. AGE      CONC.  (mIU/mL)   <=1 WEEK        5 - 50     2 WEEKS       50 - 500     3 WEEKS       100 - 10,000     4 WEEKS     1,000 - 30,000        FEMALE AND NON-PREGNANT FEMALE:     LESS THAN 5 mIU/mL   SARS Coronavirus 2 (CEPHEID - Performed in [facility] hospital lab), Hosp Order     Status: None   Collection Time: 08/09/18  8:19 AM  Result Value Ref Range   SARS Coronavirus 2 NEGATIVE NEGATIVE    Comment: (NOTE) If result is NEGATIVE SARS-CoV-2 target nucleic acids are  NOT DETECTED. The SARS-CoV-2 RNA is generally detectable in upper and lower  respiratory specimens during the acute phase of infection. The lowest  concentration of SARS-CoV-2 viral copies this assay can detect is 250  copies / mL. A negative result does not preclude SARS-CoV-2 infection  and should not be used as the sole basis for treatment or other  patient management decisions.  A  negative result may occur with  improper specimen collection / handling, submission of specimen other  than nasopharyngeal swab, presence of viral mutation(s) within the  areas targeted by this assay, and inadequate number of viral copies  (<250 copies / mL). A negative result must be combined with clinical  observations, patient history, and epidemiological information. If result is POSITIVE SARS-CoV-2 target nucleic acids are DETECTED. The SARS-CoV-2 RNA is generally detectable in upper and lower  respiratory specimens dur ing the acute phase of infection.  Positive  results are indicative of active infection with SARS-CoV-2.  Clinical  correlation with patient history and other diagnostic information is  necessary to determine patient infection status.  Positive results do  not rule out bacterial infection or co-infection with other viruses. If result is PRESUMPTIVE POSTIVE SARS-CoV-2 nucleic acids MAY BE PRESENT.   A presumptive positive result was obtained on the submitted specimen  and confirmed on repeat testing.  While 2019 novel coronavirus  (SARS-CoV-2) nucleic acids may be present in the submitted sample  additional confirmatory testing may be necessary for epidemiological  and / or clinical management purposes  to differentiate between  SARS-CoV-2 and other Sarbecovirus currently known to infect humans.  If clinically indicated additional testing with an alternate test  methodology [phone]) is advised. The SARS-CoV-2 RNA is generally  detectable in upper and lower respiratory sp ecimens  during the acute  phase of infection. The expected result is Negative. Fact Sheet for Patients:  BoilerBrush.com.cyhttps://www.fda.gov/media/136312/download Fact Sheet for Healthcare Providers: https://pope.com/https://www.fda.gov/media/136313/download This test is not yet approved or cleared by the [place] FDA and has been authorized for detection and/or diagnosis of SARS-CoV-2 by FDA under an Emergency Use Authorization (EUA).  This EUA will remain in effect (meaning this test can be used) for the duration of the COVID-19 declaration under Section 564(b)(1) of the Act, 21 U.S.C. section 360bbb-3(b)(1), unless the authorization is terminated or revoked sooner. Performed at [facility], [address] [place], [place] [id number]    Dg Abd 1 View  Result Date: 08/09/2018 CLINICAL DATA:  Ureteral stent EXAM: ABDOMEN - 1 VIEW COMPARISON:  None. FINDINGS: There is a left-sided double-J ureteral stent in place. The alignment appears grossly appropriate. Exact positioning cannot be determined on this exam. There is a moderate amount of stool in the colon. The bowel gas pattern is nonobstructive. Multiple surgical staples project over the left upper quadrant. IMPRESSION: Grossly well-positioned ureteral stent on the left. Electronically Signed   By: Katherine Mantlehristopher  Green M.D.   On: 08/09/2018 03:21   Ct Renal Stone Study  Result Date: 08/09/2018 CLINICAL DATA:  Fever and left flank pain EXAM: CT ABDOMEN AND PELVIS WITHOUT CONTRAST TECHNIQUE: Multidetector CT imaging of the abdomen and pelvis was performed following the standard protocol without IV contrast. COMPARISON:  Radiography from earlier today. 04/27/2015 abdominal CT FINDINGS: Lower chest:  No contributory findings. Hepatobiliary: No focal liver abnormality.No evidence of biliary obstruction or stone. Pancreas: Unremarkable. Spleen: Unremarkable. Adrenals/Urinary Tract: Negative adrenals. Mild left hydronephrosis despite a well-positioned internal ureteral  stent. Thread/wire again seen at the lower stent which is straight. No underlying stone is seen. Unremarkable bladder. Stomach/Bowel:  No obstruction. No appendicitis.  Gastric bypass. Vascular/Lymphatic: No acute vascular abnormality. No mass or adenopathy. Reproductive:Negative.  Cesarean section scar. Other: No ascites or pneumoperitoneum. Fatty midline and infraumbilical hernia. Musculoskeletal: No acute abnormalities. IMPRESSION: 1. Mild left hydronephrosis despite stable positioning of the ureteral stent. No visible urolithiasis. 2. Otherwise stable, as described. Electronically  Signed   By: Marnee Spring M.D.   On: 08/09/2018 06:02    Pending Labs Unresulted Labs (From admission, onward)    Start     Ordered   08/09/18 0206  Blood culture (routine x 2)  BLOOD CULTURE X 2,   STAT     08/09/18 0208   08/09/18 0206  Urine culture  ONCE - STAT,   STAT     08/09/18 0208   Signed and Held  Creatinine, serum  (enoxaparin (LOVENOX)    CrCl >/= 30 ml/min)  Weekly,   R    Comments:  while on enoxaparin therapy    Signed and Held   Signed and Held  Basic metabolic panel  Tomorrow morning,   R     Signed and Held   Signed and Held  CBC  Tomorrow morning,   R     Signed and Held          Vitals/Pain Today's Vitals   08/09/18 0900 08/09/18 0930 08/09/18 1000 08/09/18 1030  BP: 124/64 120/69 114/71 106/60  Pulse:  85 99 80  Resp:  16  16  Temp:      TempSrc:      SpO2:  98% 100% 99%  Weight:      Height:      PainSc:        Isolation Precautions Droplet and Contact precautions  Medications Medications  acetaminophen (TYLENOL) tablet 650 mg (has no administration in time range)  tamsulosin (FLOMAX) capsule 0.4 mg (has no administration in time range)  0.9 %  sodium chloride infusion (has no administration in time range)  cefTRIAXone (ROCEPHIN) 1 g in sodium chloride 0.9 % 100 mL IVPB (has no administration in time range)  ketorolac (TORADOL) tablet 10 mg (has no administration  in time range)  HYDROmorphone (DILAUDID) injection 0.5 mg (has no administration in time range)  ondansetron (ZOFRAN) injection 4 mg (4 mg Intravenous Given 08/09/18 0238)  HYDROmorphone (DILAUDID) injection 1 mg (1 mg Intravenous Given 08/09/18 0238)  cefTRIAXone (ROCEPHIN) 1 g in sodium chloride 0.9 % 100 mL IVPB (0 g Intravenous Stopped 08/09/18 0330)  HYDROmorphone (DILAUDID) injection 1 mg (1 mg Intravenous Given 08/09/18 0411)  HYDROmorphone (DILAUDID) injection 1 mg (1 mg Intravenous Given 08/09/18 0635)  ondansetron (ZOFRAN) injection 4 mg (4 mg Intravenous Given 08/09/18 0635)  metoCLOPramide (REGLAN) injection 10 mg (10 mg Intravenous Given 08/09/18 0819)  sodium chloride 0.9 % bolus 1,000 mL (0 mLs Intravenous Stopped 08/09/18 0936)  ketorolac (TORADOL) 30 MG/ML injection 15 mg (15 mg Intravenous Given 08/09/18 0819)    Mobility walks Low fall risk   Focused Assessments Urinary, stent and stone   R Recommendations: See Admitting Provider Note  Report given to:   Additional Notes: .

## 2018-08-09 NOTE — Consult Note (Addendum)
Urology Consult Note   Requesting Attending Physician:  Arby BarrettePfeiffer, Marcy, MD Service Providing Consult: Urology  Consulting Attending: Bjorn PippinJohn Marisue Canion, MD   Reason for Consult: Retained ureteral stent, fevers  HPI: Gabrielle Roy is seen in consultation for reasons noted above at the request of Arby BarrettePfeiffer, Marcy, MD for evaluation of fevers in the setting of a retained ureteral stent.  This is a 28 y.o. female with a history of nephrolithiasis and migraine who previously presented with left hydronephrosis and ureteral stone on renal US during pregnancy. She experienced significant stent discomfort and also urinary retention requiring CIC during her pregnancy and was delivered by c-section at 38 WGA. She is scheduled for ureteroscopic stone extraction and stent exchange on 08/10/18.   The patient presented to the ED this morning with fevers x 18 hours, poor appetite and increased left flank pain. Pain is worse during voiding. Tmax of 102F at home and took tylenol 500 mg of tylenol prior to presentation. She also complains of headache.   Pre-operative COVID test negative.   She continues to breastfeed.   In the ED, she was afebrile and hemodynamically stable. No leukocytosis and normal creatinine. CT scan demonstrated stent in appropriate position with mild left hydronephrosis and no visible renal or ureteral stone. Continues to have left flank pain.   She stopped taking flomax because she ran out of the medication. Denies difficulty voiding.    Past Medical History: Past Medical History:  Diagnosis Date  . Anxiety   . Common migraine with intractable migraine 03/10/2018  . Depression   . Epigastric hernia   . History of kidney stones   . History of morbid obesity   . Hx of pre-eclampsia in prior pregnancy, currently pregnant, first trimester   . Hydronephrosis, left    Bilateral  . Kidney stone    Stent placed in Feb 2017  . PONV (postoperative nausea and vomiting)    nausea during last c  section  . Supervision of normal pregnancy, antepartum 10/04/2015    Clinic  [facility] - transfer from Dr. Shawnie Ponsorn Prenatal Labs Dating  Blood type:    Genetic Screen 1 Screen:    AFP:     Quad:     NIPS: Antibody:  Anatomic US  Nml fetal anatomy @ 18 wks; low lying placenta > resolved @23  wks Rubella:   GTT Early:               Third trimester:  RPR:    Flu vaccine  Declined HBsAg:    TDaP vaccine                                               Rhogam: HIV:    Baby Food  Breastfeed                                            GBS: (For PCN allergy, check sensitivities) Contraception  Pap: Circumcision  n/a female ([employer] ultrasound)  Pediatrician   Support Person  [name] (husband)      Past Surgical History:  Past Surgical History:  Procedure Laterality Date  . CESAREAN SECTION    . CESAREAN SECTION N/A 02/20/2016   Procedure: REPEAT CESAREAN SECTION;  Surgeon: Hermina StaggersMichael L Ervin, MD;  Location: [facility];  Service: Obstetrics;  Laterality: N/A;  . CESAREAN SECTION N/A 07/08/2018   Procedure: CESAREAN SECTION;  Surgeon: Allie Bossier, MD;  Location: [facility];  Service: Obstetrics;  Laterality: N/A;  . CYSTOSCOPY W/ URETERAL STENT PLACEMENT Left 04/26/2018   Procedure: CYSTOSCOPY WITH RETROGRADE PYELOGRAM/URETERAL STENT PLACEMENT;  Surgeon: Crist Fat, MD;  Location: [facility];  Service: Urology;  Laterality: Left;  . CYSTOSCOPY WITH STENT PLACEMENT    . FINGER SURGERY    . GASTRIC BYPASS      Medication: Current Facility-Administered Medications  Medication Dose Route Frequency Provider Last Rate Last Dose  . acetaminophen (TYLENOL) tablet 650 mg  650 mg Oral Once Joy, Shawn C, PA-C      . metoCLOPramide (REGLAN) injection 10 mg  10 mg Intravenous Once Joy, Shawn C, PA-C      . tamsulosin (FLOMAX) capsule 0.4 mg  0.4 mg Oral Daily Joy, Shawn C, PA-C       Current Outpatient Medications  Medication Sig Dispense Refill  . Catheters (SPECIMEN CATHETER FEMALE) MISC 1 Device by Does  not apply route 4 (four) times daily. Pt is to use catheter QID for self cath 120 each 6  . ibuprofen (ADVIL) 800 MG tablet Take 800 mg by mouth every 8 (eight) hours as needed for fever, headache or moderate pain.     . Prenatal Vit-Fe Fumarate-FA (PRENATAL VITAMIN PO) Take 1 tablet by mouth daily.     . traMADol (ULTRAM) 50 MG tablet Take 50 mg by mouth 2 (two) times daily as needed for severe pain.      Allergies: Allergies  Allergen Reactions  . Alcohol Hives    Allergic to alcohol containing products including cough medicines that contain alcohol  . Flagyl [Metronidazole] Other (See Comments)    Facial flushing  . Hydrocodone-Acetaminophen Nausea And Vomiting    Social History: Social History   Tobacco Use  . Smoking status: Former Games developer  . Smokeless tobacco: Never Used  Substance Use Topics  . Alcohol use: No  . Drug use: No    Family History Family History  Problem Relation Age of Onset  . Hypertension Father   . Heart disease Father   . Hearing loss Father     Review of Systems 10 systems were reviewed and are negative except as noted specifically in the HPI.  Objective   Vital signs in last 24 hours: BP 128/63   Pulse 80   Temp 99.6 F (37.6 C) (Oral)   Resp 16   Ht  (1.549 m)   Wt 83 kg   SpO2 100%   BMI 34.58 kg/m   Physical Exam General: NAD, A&O, resting, appropriate HEENT: [place]/AT, EOMI, MMM Pulmonary: Normal work of breathing Cardiovascular: HDS, adequate peripheral perfusion Abdomen: Soft, obese abdomen limiting examination, but non-tender to palpation and not significantly distended GU: No CVA tenderness Extremities: warm and well perfused Neuro: Appropriate, no focal neurological deficits  Most Recent Labs: Lab Results  Component Value Date   WBC 7.8 08/09/2018   HGB 11.7 (L) 08/09/2018   HCT 36.7 08/09/2018   PLT 193 08/09/2018    Lab Results  Component Value Date   NA 138 08/09/2018   K 3.5 08/09/2018   CL 106  08/09/2018   CO2 24 08/09/2018   BUN 11 08/09/2018   CREATININE 0.63 08/09/2018   CALCIUM 8.6 (L) 08/09/2018    No results found for: INR, APTT   IMAGING: Dg Abd  1 View  Result Date: 08/09/2018 CLINICAL DATA:  Ureteral stent EXAM: ABDOMEN - 1 VIEW COMPARISON:  None. FINDINGS: There is a left-sided double-J ureteral stent in place. The alignment appears grossly appropriate. Exact positioning cannot be determined on this exam. There is a moderate amount of stool in the colon. The bowel gas pattern is nonobstructive. Multiple surgical staples project over the left upper quadrant. IMPRESSION: Grossly well-positioned ureteral stent on the left. Electronically Signed   By: Katherine Mantle M.D.   On: 08/09/2018 03:21   Ct Renal Stone Study  Result Date: 08/09/2018 CLINICAL DATA:  Fever and left flank pain EXAM: CT ABDOMEN AND PELVIS WITHOUT CONTRAST TECHNIQUE: Multidetector CT imaging of the abdomen and pelvis was performed following the standard protocol without IV contrast. COMPARISON:  Radiography from earlier today. 04/27/2015 abdominal CT FINDINGS: Lower chest:  No contributory findings. Hepatobiliary: No focal liver abnormality.No evidence of biliary obstruction or stone. Pancreas: Unremarkable. Spleen: Unremarkable. Adrenals/Urinary Tract: Negative adrenals. Mild left hydronephrosis despite a well-positioned internal ureteral stent. Thread/wire again seen at the lower stent which is straight. No underlying stone is seen. Unremarkable bladder. Stomach/Bowel:  No obstruction. No appendicitis.  Gastric bypass. Vascular/Lymphatic: No acute vascular abnormality. No mass or adenopathy. Reproductive:Negative.  Cesarean section scar. Other: No ascites or pneumoperitoneum. Fatty midline and infraumbilical hernia. Musculoskeletal: No acute abnormalities. IMPRESSION: 1. Mild left hydronephrosis despite stable positioning of the ureteral stent. No visible urolithiasis. 2. Otherwise stable, as described.  Electronically Signed   By: Marnee Spring M.D.   On: 08/09/2018 06:02    ------  Assessment:  28 y.o. female with a history of urinary retention and nephrolithiasis with retained indwelling ureteral stent who presents with likely pyelonephritis and left flank pain. In the ED, she is afebrile and hemodynamically stable with ureteral stent in correct position and mild hydronephrosis on CT scan. Hydronephrosis and increased flank pain during voiding is likely due to reflux of urine in setting of indwelling ureteral stent which may be exacerbated by incomplete emptying. Anticipate flank pain to improve with treatment of pyelonephritis and improved bladder emptying.    Recommendations: - No further urologic intervention is indicated. Agree with empiric antibiotic treatment for pyelonephritis with plan for a 14 day course.  - While inpatient, okay for IV antibiotics. Recommend empiric ciprofloxacin at time of discharge, unless culture results in the interim.  - Given passage of stone, will cancel urology OR. Unfortunately, in setting of fevers and UTI on UA, do not recommend immediate ureteral stent removal. Will arrange for stent removal on Thursday in urology clinic.  - Restart flomax given history of urinary retention and for improved stent tolerance.  - Pending pain control on po medication, appropriate for discharge from urologic perspective.    Thank you for this consult. Please contact the urology consult pager with any further questions/concerns.  I reviewed her notes, imaging and labs and  discussed her case with Dr. Marlou Porch and Dr. Leone Payor but didn't see her this morning.   I concur with the plan.

## 2018-08-09 NOTE — ED Notes (Signed)
Pt ambulated with steady gait

## 2018-08-09 NOTE — H&P (Addendum)
History and Physical    Gabrielle Roy:[id number] DOB: Jun 30, 1990 DOA: 08/09/2018  PCP: Patient, No Pcp Per Patient coming from: Home  Chief Complaint: Left flank pain  HPI: Gabrielle Roy is a 28 y.o. female with medical history significant of kidney stones, left hydronephrosis s/p left ureteral stent, anxiety, depression, currently post-partum.  Patient presented secondary to development of fever and severe sharp left flank pain that started yesterday evening around 11:00 AM.  T-max of 102 F.  She took some Tylenol which did not help with her pain but did help with her fever.  Her pain was nonradiating and remained persistent.  Pain worsened when she tried to urinate but she denies dysuria.  ED Course: Vitals: Afebrile, pulse of 80, RR of 16, BP of 128/63, 100% on room Labs: Hgb 11.7 Imaging: CT renal stone study significant for mild left hydronephrosis Medications/Course: Ceftriaxone, NS bolus, Dilaudid IV, Toradol IV  Review of Systems: Review of Systems  Constitutional: Positive for fever.  Respiratory: Negative for shortness of breath and wheezing.   Cardiovascular: Negative for chest pain.  Gastrointestinal: Positive for nausea. Negative for abdominal pain, constipation, diarrhea and vomiting.  Genitourinary: Positive for flank pain (left).  All other systems reviewed and are negative.   Past Medical History:  Diagnosis Date   Anxiety    Common migraine with intractable migraine 03/10/2018   Depression    Epigastric hernia    History of kidney stones    History of morbid obesity    Hx of pre-eclampsia in prior pregnancy, currently pregnant, first trimester    Hydronephrosis, left    Bilateral   Kidney stone    Stent placed in Feb 2017   PONV (postoperative nausea and vomiting)    nausea during last c section   Supervision of normal pregnancy, antepartum 10/04/2015    Clinic  [place] - transfer from Dr. Shawnie Pons Prenatal Labs Dating  Blood type:     Genetic Screen 1 Screen:    AFP:     Quad:     NIPS: Antibody:  Anatomic [place]  Nml fetal anatomy @ 18 wks; low lying placenta > resolved  wks Rubella:   GTT Early:               Third trimester:  RPR:    Flu vaccine  Declined HBsAg:    TDaP vaccine                                               Rhogam: HIV:    Baby Food  Breastfeed                                            GBS: (For PCN allergy, check sensitivities) Contraception  Pap: Circumcision  n/a female ([employer] ultrasound)  Pediatrician   Support Person  [name] (husband)      Past Surgical History:  Procedure Laterality Date   CESAREAN SECTION     CESAREAN SECTION N/A 02/20/2016   Procedure: REPEAT CESAREAN SECTION;  Surgeon: Hermina Staggers, MD;  Location: [facility]IRTHING SU[facility];  Service: Obstetrics;  Laterality: N/A;   CESAREAN SECTION N/A 07/08/2018   Procedure: CESAREAN SECTION;  Surgeon: Allie Bossier, MD;  Location: [facility];  Service: Obstetrics;  Laterality: N/A;   CYSTOSCOPY W/ URETERAL STENT PLACEMENT Left 04/26/2018   Procedure: CYSTOSCOPY WITH RETROGRADE PYELOGRAM/URETERAL STENT PLACEMENT;  Surgeon: Crist Fat, MD;  Location: [facility];  Service: Urology;  Laterality: Left;   CYSTOSCOPY WITH STENT PLACEMENT     FINGER SURGERY     GASTRIC BYPASS       reports that she has quit smoking. She has never used smokeless tobacco. She reports that she does not drink alcohol or use drugs.  Allergies  Allergen Reactions   Alcohol Hives    Allergic to alcohol containing products including cough medicines that contain alcohol   Flagyl [Metronidazole] Other (See Comments)    Facial flushing   Hydrocodone-Acetaminophen Nausea And Vomiting    Family History  Problem Relation Age of Onset   Hypertension Father    Heart disease Father    Hearing loss Father    Prior to Admission medications   Medication Sig Start Date End Date Taking? Authorizing Provider  acetaminophen (TYLENOL) 325 MG tablet Take 650 mg by  mouth every 6 (six) hours as needed for mild pain or headache.   Yes [provider]  ibuprofen (ADVIL) 800 MG tablet Take 800 mg by mouth every 8 (eight) hours as needed for fever, headache or moderate pain.    Yes [provider]  traMADol (ULTRAM) 50 MG tablet Take 50 mg by mouth 2 (two) times daily as needed for severe pain.   Yes [provider]  Catheters (SPECIMEN CATHETER FEMALE) MISC 1 Device by Does not apply route 4 (four) times daily. Pt is to use catheter QID for self cath 06/13/18   Lesly Dukes, MD    Physical Exam:  Physical Exam Constitutional:      General: She is not in acute distress.    Appearance: She is well-developed. She is ill-appearing. She is not diaphoretic.  HENT:     Mouth/Throat:     Mouth: Mucous membranes are dry.     Pharynx: Oropharynx is clear.  Eyes:     Conjunctiva/sclera: Conjunctivae normal.     Pupils: Pupils are equal, round, and reactive to light.  Neck:     Musculoskeletal: Normal range of motion.  Cardiovascular:     Rate and Rhythm: Normal rate and regular rhythm.     Heart sounds: Normal heart sounds. No murmur.  Pulmonary:     Effort: Pulmonary effort is normal. No respiratory distress.     Breath sounds: Normal breath sounds. No wheezing or rales.  Abdominal:     General: Abdomen is flat. Bowel sounds are normal. There is no distension.     Palpations: Abdomen is soft.     Tenderness: There is no abdominal tenderness. There is left CVA tenderness. There is no right CVA tenderness, guarding or rebound.  Musculoskeletal: Normal range of motion.        General: No tenderness.  Lymphadenopathy:     Cervical: No cervical adenopathy.  Skin:    General: Skin is warm and dry.  Neurological:     Mental Status: She is alert and oriented to person, place, and time.  Psychiatric:        Mood and Affect: Mood is depressed. Affect is blunt.     Labs on Admission: I have personally reviewed following labs and  imaging studies  CBC: Recent Labs  Lab 08/09/18 0215  WBC 7.8  NEUTROABS 5.3  HGB 11.7*  HCT 36.7  MCV 101.4*  PLT 193  Basic Metabolic Panel: Recent Labs  Lab 08/09/18 0215  NA 138  K 3.5  CL 106  CO2 24  GLUCOSE 99  BUN 11  CREATININE 0.63  CALCIUM 8.6*    GFR: Estimated Creatinine Clearance: 104.1 mL/min (by C-G formula based on SCr of 0.63 mg/dL).  Liver Function Tests: No results for input(s): AST, ALT, ALKPHOS, BILITOT, PROT, ALBUMIN in the last 168 hours. No results for input(s): LIPASE, AMYLASE in the last 168 hours. No results for input(s): AMMONIA in the last 168 hours.  Coagulation Profile: No results for input(s): INR, PROTIME in the last 168 hours.  Cardiac Enzymes: No results for input(s): CKTOTAL, CKMB, CKMBINDEX, TROPONINI in the last 168 hours.  BNP (last 3 results) No results for input(s): PROBNP in the last 8760 hours.  HbA1C: No results for input(s): HGBA1C in the last 72 hours.  CBG: No results for input(s): GLUCAP in the last 168 hours.  Lipid Profile: No results for input(s): CHOL, HDL, LDLCALC, TRIG, CHOLHDL, LDLDIRECT in the last 72 hours.  Thyroid Function Tests: No results for input(s): TSH, T4TOTAL, FREET4, T3FREE, THYROIDAB in the last 72 hours.  Anemia Panel: No results for input(s): VITAMINB12, FOLATE, FERRITIN, TIBC, IRON, RETICCTPCT in the last 72 hours.  Urine analysis:    Component Value Date/Time   COLORURINE YELLOW 08/09/2018 0215   APPEARANCEUR TURBID (A) 08/09/2018 0215   LABSPEC 1.008 08/09/2018 0215   PHURINE 6.0 08/09/2018 0215   GLUCOSEU NEGATIVE 08/09/2018 0215   HGBUR LARGE (A) 08/09/2018 0215   BILIRUBINUR NEGATIVE 08/09/2018 0215   BILIRUBINUR neg 04/25/2018 1020   KETONESUR NEGATIVE 08/09/2018 0215   PROTEINUR 100 (A) 08/09/2018 0215   UROBILINOGEN 0.2 04/25/2018 1020   NITRITE POSITIVE (A) 08/09/2018 0215   LEUKOCYTESUR LARGE (A) 08/09/2018 0215     Radiological Exams on Admission: Dg  Abd 1 View  Result Date: 08/09/2018 CLINICAL DATA:  Ureteral stent EXAM: ABDOMEN - 1 VIEW COMPARISON:  None. FINDINGS: There is a left-sided double-J ureteral stent in place. The alignment appears grossly appropriate. Exact positioning cannot be determined on this exam. There is a moderate amount of stool in the colon. The bowel gas pattern is nonobstructive. Multiple surgical staples project over the left upper quadrant. IMPRESSION: Grossly well-positioned ureteral stent on the left. Electronically Signed   By: Katherine Mantle M.D.   On: 08/09/2018 03:21   Ct Renal Stone Study  Result Date: 08/09/2018 CLINICAL DATA:  Fever and left flank pain EXAM: CT ABDOMEN AND PELVIS WITHOUT CONTRAST TECHNIQUE: Multidetector CT imaging of the abdomen and pelvis was performed following the standard protocol without IV contrast. COMPARISON:  Radiography from earlier today. 04/27/2015 abdominal CT FINDINGS: Lower chest:  No contributory findings. Hepatobiliary: No focal liver abnormality.No evidence of biliary obstruction or stone. Pancreas: Unremarkable. Spleen: Unremarkable. Adrenals/Urinary Tract: Negative adrenals. Mild left hydronephrosis despite a well-positioned internal ureteral stent. Thread/wire again seen at the lower stent which is straight. No underlying stone is seen. Unremarkable bladder. Stomach/Bowel:  No obstruction. No appendicitis.  Gastric bypass. Vascular/Lymphatic: No acute vascular abnormality. No mass or adenopathy. Reproductive:Negative.  Cesarean section scar. Other: No ascites or pneumoperitoneum. Fatty midline and infraumbilical hernia. Musculoskeletal: No acute abnormalities. IMPRESSION: 1. Mild left hydronephrosis despite stable positioning of the ureteral stent. No visible urolithiasis. 2. Otherwise stable, as described. Electronically Signed   By: Marnee Spring M.D.   On: 08/09/2018 06:02    Assessment/Plan Active Problems:   Acute pyelonephritis   Pyelonephritis In setting of a  foreign  body (stent). Does not meets sepsis criteria on admission. Blood and urine cultures obtained in the ED. Requiring IV antibiotics for complicated UTI with difficult to control pain. -Continue Ceftriaxone -Follow up culture data  Hydronephrosis Left sided. Mild. Stent in place. Urology consulted.  Postpartum Patient is 4 weeks post-partum and is breast feeding/pumping. -Avoid medications which pose risk to baby through breastmilk   DVT prophylaxis: Lovenox Code Status: Full code Family Communication: None Disposition Plan: Medical floor Consults called: Urology by EDP Admission status: Inpatient for IV antibiotics in setting of complicated UTI/Pyelonephritis in addition to intractable pain requiring IV narcotics   Jacquelin Hawkingalph Jaylie Neaves, MD [facility] 08/09/2018, 8:07 AM

## 2018-08-09 NOTE — ED Provider Notes (Signed)
Gabrielle Roy is a 28 y.o. female, presenting to the ED with left flank pain for the last 2 days.   HPI from Sharilyn SitesLisa Sanders, PA-C: "28 y.o. F with hx of anxiety, depression, migraines, kidney stones with left ureteral stent in place since February 2020, presenting to the ED for left flank pain and fever.  Patient reports stent has been in place for several months due to pregnancy.  She had c-section on 07/08/18, this went well without noted complications.  Patient states she is actually scheduled for surgery tomorrow with Dr. Marlou PorchHerrick to have stent removed and possible laser lithotripsy and new stent placement.  States over the past 24 hours she has started developing fever, chills, and poor appetite.  T-max at home 102F, she did take 500mg  Tylenol prior to arrival.  States she was previously taking oxycodone for pain, but was switched to tramadol as she is currently breast-feeding.  States she has not had a menstrual cycle since her C-section.  She did her prescreening yesterday for surgery which included a COVID test.  She denies any known sick exposures, her family has been well."  Past Medical History:  Diagnosis Date  . Anxiety   . Common migraine with intractable migraine 03/10/2018  . Depression   . Epigastric hernia   . History of kidney stones   . History of morbid obesity   . Hx of pre-eclampsia in prior pregnancy, currently pregnant, first trimester   . Hydronephrosis, left    Bilateral  . Kidney stone    Stent placed in Feb 2017  . PONV (postoperative nausea and vomiting)    nausea during last c section  . Supervision of normal pregnancy, antepartum 10/04/2015    Clinic  [facility] - transfer from Dr. Shawnie Ponsorn Prenatal Labs Dating  Blood type:    Genetic Screen 1 Screen:    AFP:     Quad:     NIPS: Antibody:  Anatomic US  Nml fetal anatomy @ 18 wks; low lying placenta > resolved @23  wks Rubella:   GTT Early:               Third trimester:  RPR:    Flu vaccine  Declined HBsAg:    TDaP  vaccine                                               Rhogam: HIV:    Baby Food  Breastfeed                                            GBS: (For PCN allergy, check sensitivities) Contraception  Pap: Circumcision  n/a female ([facility] ultrasound)  Pediatrician   Support Person  [name] (husband)     Past Surgical History:  Procedure Laterality Date  . CESAREAN SECTION    . CESAREAN SECTION N/A 02/20/2016   Procedure: REPEAT CESAREAN SECTION;  Surgeon: Hermina StaggersMichael L Ervin, MD;  [facility];  Service: Obstetrics;  Laterality: N/A;  . CESAREAN SECTION N/A 07/08/2018   Procedure: CESAREAN SECTION;  Surgeon: Allie Bossierove, Myra C, MD;  Location: [facility] LD ORS;  Service: Obstetrics;  Laterality: N/A;  . CYSTOSCOPY W/ URETERAL STENT PLACEMENT Left 04/26/2018   Procedure: CYSTOSCOPY WITH RETROGRADE PYELOGRAM/URETERAL STENT PLACEMENT;  Surgeon: Crist Fat, MD;  Location: [facility] ORS;  Service: Urology;  Laterality: Left;  . CYSTOSCOPY WITH STENT PLACEMENT    . FINGER SURGERY    . GASTRIC BYPASS      Physical Exam  BP 118/61 (BP Location: Right Arm)   Pulse 80   Temp 99.6 F (37.6 C) (Oral)   Resp 16   Ht  (1.549 m)   Wt 83 kg   SpO2 100%   BMI 34.58 kg/m   Physical Exam Vitals signs and nursing note reviewed.  Constitutional:      General: She is not in acute distress.    Appearance: She is well-developed. She is not diaphoretic.  HENT:     Head: Normocephalic and atraumatic.     Mouth/Throat:     Mouth: Mucous membranes are moist.     Pharynx: Oropharynx is clear.  Eyes:     Conjunctiva/sclera: Conjunctivae normal.  Neck:     Musculoskeletal: Normal range of motion and neck supple. No neck rigidity.  Cardiovascular:     Rate and Rhythm: Normal rate and regular rhythm.     Pulses: Normal pulses.          Radial pulses are 2+ on the right side and 2+ on the left side.       Posterior tibial pulses are 2+ on the right side and 2+ on the left side.     Heart sounds: Normal  heart sounds.  Pulmonary:     Effort: Pulmonary effort is normal. No respiratory distress.     Breath sounds: Normal breath sounds.  Abdominal:     Palpations: Abdomen is soft.     Tenderness: There is no abdominal tenderness. There is left CVA tenderness. There is no guarding.  Musculoskeletal:     Right lower leg: No edema.     Left lower leg: No edema.     Comments: She has some tenderness to the back of the head and into the upper cervical musculature no lateral neck tenderness or midline spinal tenderness.  Lymphadenopathy:     Cervical: No cervical adenopathy.  Skin:    General: Skin is warm and dry.  Neurological:     Mental Status: She is alert.     Comments: Sensation grossly intact to light touch in the extremities.  Grip strengths equal bilaterally.  Strength 5/5 in all extremities. No gait disturbance. Coordination intact. Cranial nerves III-XII grossly intact. No facial droop.   Psychiatric:        Mood and Affect: Mood and affect normal.        Speech: Speech normal.        Behavior: Behavior normal.     ED Course/Procedures    Procedures   Abnormal Labs Reviewed  CBC WITH DIFFERENTIAL/PLATELET - Abnormal; Notable for the following components:      Result Value   RBC 3.62 (*)    Hemoglobin 11.7 (*)    MCV 101.4 (*)    RDW 11.2 (*)    All other components within normal limits  BASIC METABOLIC PANEL - Abnormal; Notable for the following components:   Calcium 8.6 (*)    All other components within normal limits  URINALYSIS, ROUTINE W REFLEX MICROSCOPIC - Abnormal; Notable for the following components:   APPearance TURBID (*)    Hgb urine dipstick LARGE (*)    Protein, ur 100 (*)    Nitrite POSITIVE (*)    Leukocytes,Ua LARGE (*)    WBC, UA >  50 (*)    Bacteria, UA MANY (*)    Non Squamous Epithelial 0-5 (*)    All other components within normal limits    Dg Abd 1 View  Result Date: 08/09/2018 CLINICAL DATA:  Ureteral stent EXAM: ABDOMEN - 1 VIEW  COMPARISON:  None. FINDINGS: There is a left-sided double-J ureteral stent in place. The alignment appears grossly appropriate. Exact positioning cannot be determined on this exam. There is a moderate amount of stool in the colon. The bowel gas pattern is nonobstructive. Multiple surgical staples project over the left upper quadrant. IMPRESSION: Grossly well-positioned ureteral stent on the left. Electronically Signed   By: Katherine Mantle M.D.   On: 08/09/2018 03:21   Ct Renal Stone Study  Result Date: 08/09/2018 CLINICAL DATA:  Fever and left flank pain EXAM: CT ABDOMEN AND PELVIS WITHOUT CONTRAST TECHNIQUE: Multidetector CT imaging of the abdomen and pelvis was performed following the standard protocol without IV contrast. COMPARISON:  Radiography from earlier today. 04/27/2015 abdominal CT FINDINGS: Lower chest:  No contributory findings. Hepatobiliary: No focal liver abnormality.No evidence of biliary obstruction or stone. Pancreas: Unremarkable. Spleen: Unremarkable. Adrenals/Urinary Tract: Negative adrenals. Mild left hydronephrosis despite a well-positioned internal ureteral stent. Thread/wire again seen at the lower stent which is straight. No underlying stone is seen. Unremarkable bladder. Stomach/Bowel:  No obstruction. No appendicitis.  Gastric bypass. Vascular/Lymphatic: No acute vascular abnormality. No mass or adenopathy. Reproductive:Negative.  Cesarean section scar. Other: No ascites or pneumoperitoneum. Fatty midline and infraumbilical hernia. Musculoskeletal: No acute abnormalities. IMPRESSION: 1. Mild left hydronephrosis despite stable positioning of the ureteral stent. No visible urolithiasis. 2. Otherwise stable, as described. Electronically Signed   By: Marnee Spring M.D.   On: 08/09/2018 06:02   Medications  acetaminophen (TYLENOL) tablet 650 mg (has no administration in time range)  tamsulosin (FLOMAX) capsule 0.4 mg (has no administration in time range)  0.9 %  sodium chloride  infusion (has no administration in time range)  cefTRIAXone (ROCEPHIN) 1 g in sodium chloride 0.9 % 100 mL IVPB (has no administration in time range)  ketorolac (TORADOL) tablet 10 mg (has no administration in time range)  HYDROmorphone (DILAUDID) injection 0.5 mg (has no administration in time range)  ondansetron (ZOFRAN) injection 4 mg (4 mg Intravenous Given 08/09/18 0238)  HYDROmorphone (DILAUDID) injection 1 mg (1 mg Intravenous Given 08/09/18 0238)  cefTRIAXone (ROCEPHIN) 1 g in sodium chloride 0.9 % 100 mL IVPB (0 g Intravenous Stopped 08/09/18 0330)  HYDROmorphone (DILAUDID) injection 1 mg (1 mg Intravenous Given 08/09/18 0411)  HYDROmorphone (DILAUDID) injection 1 mg (1 mg Intravenous Given 08/09/18 0635)  ondansetron (ZOFRAN) injection 4 mg (4 mg Intravenous Given 08/09/18 0635)  metoCLOPramide (REGLAN) injection 10 mg (10 mg Intravenous Given 08/09/18 0819)  sodium chloride 0.9 % bolus 1,000 mL (1,000 mLs Intravenous New Bag/Given 08/09/18 0819)  ketorolac (TORADOL) 30 MG/ML injection 15 mg (15 mg Intravenous Given 08/09/18 0819)    MDM    Clinical Course as of Aug 08 852  Tue Aug 09, 2018  0720 Patient states her left flank pain is currently 7/10. She also complains of a generalized headache beginning yesterday, throbbing, nonradiating, rated 10/10.   [SJ]  [id number] Dr. Leone Payor, urology fellow, came to assess patient. States they will plan on stent removal in office on Thursday, 6/4. They are waiting 48 hours due to the patient's fever. Advises restarting the Flomax.  Start patient on Cipro. She advises that the patient should wait 4 hours from taking each dose  of Cipro to breast-feed.  She also advised her to discard any milk pumped during this 4-hour time period.  In addition, she asked her to follow-up with her OB/GYN to verify this advice. I discussed the need for admission for pain management.  She advises admission via medicine service.   [SJ]  [id number] Spoke with Sharyl Nimrod, pharmacist.  Patient  has a listed hives allergy to alcohol; "drinking alcohol and the alcohol contained in things like cough syrup." Toradol is giving me an allergy warning due to the listed alcohol allergy.  Sharyl Nimrod states it should not be an issue to give the Toradol. She also found an entry in the patient's record where she was given Toradol without complication.   [SJ]  [id number] Spoke with Dr. Caleb Popp, hospitalist.  States he will take care of admission.   [SJ]    Clinical Course User Index [SJ] Anselm Pancoast, PA-C     Patient care handoff report received from Sharilyn Sites, PA-C. Plan: Urology to see the patient.  Patient may need to be admitted for pain management regardless of urology decision.    Patient has persistent left flank pain with evidence of infection on UA. Patient is nontoxic appearing, afebrile, not tachycardic, not tachypneic, not hypotensive, maintains excellent SPO2 on room air.  Doubt sepsis.  Urology will plan on removing the stent, but will wait 48 hours due to the patient's fever.   She has required 3 doses of Dilaudid for pain management between 2:30 AM and 6:30 AM and is still complaining of severe pain. I suspect since we have had difficulty controlling the patient's pain with IV pain medications, she will need to be admitted for continued pain management since adequate pain control with oral medications is unlikely. She has been admitted for pain management.   I spoke with the patient regarding her breast-feeding and some of the medications we are giving to her.  She states she has "plenty of pumped milk at home."  She is comfortable with discarding her pumped milk while she is receiving medications here in the hospital.  Vitals:   08/09/18 0430 08/09/18 0600 08/09/18 0630 08/09/18 0700  BP: 116/67 111/63 123/76 118/61  Pulse: 85 79 80   Resp: 16 15  16   Temp:      TempSrc:      SpO2: 96% 96% 100%   Weight:      Height:           Anselm Pancoast, PA-C 08/09/18 0855     Arby Barrette, MD 08/09/18 [phone]

## 2018-08-10 ENCOUNTER — Ambulatory Visit (HOSPITAL_BASED_OUTPATIENT_CLINIC_OR_DEPARTMENT_OTHER): Admission: RE | Admit: 2018-08-10 | Source: Home / Self Care | Admitting: Urology

## 2018-08-10 DIAGNOSIS — N1330 Unspecified hydronephrosis: Secondary | ICD-10-CM

## 2018-08-10 HISTORY — DX: Ventral hernia without obstruction or gangrene: K43.9

## 2018-08-10 HISTORY — DX: Anxiety disorder, unspecified: F41.9

## 2018-08-10 HISTORY — DX: Depression, unspecified: F32.A

## 2018-08-10 HISTORY — DX: Personal history of other endocrine, nutritional and metabolic disease: Z86.39

## 2018-08-10 HISTORY — DX: Unspecified hydronephrosis: N13.30

## 2018-08-10 HISTORY — DX: Personal history of other specified conditions: Z87.898

## 2018-08-10 HISTORY — DX: Personal history of urinary calculi: Z87.442

## 2018-08-10 LAB — CBC
HCT: 33 % — ABNORMAL LOW (ref 36.0–46.0)
Hemoglobin: 10.1 g/dL — ABNORMAL LOW (ref 12.0–15.0)
MCH: 31.5 pg (ref 26.0–34.0)
MCHC: 30.6 g/dL (ref 30.0–36.0)
MCV: 102.8 fL — ABNORMAL HIGH (ref 80.0–100.0)
Platelets: 153 10*3/uL (ref 150–400)
RBC: 3.21 MIL/uL — ABNORMAL LOW (ref 3.87–5.11)
RDW: 11.2 % — ABNORMAL LOW (ref 11.5–15.5)
WBC: 8.2 10*3/uL (ref 4.0–10.5)
nRBC: 0 % (ref 0.0–0.2)

## 2018-08-10 LAB — URINE CULTURE: Culture: >=100000 — AB

## 2018-08-10 LAB — BASIC METABOLIC PANEL
Anion gap: 5 (ref 5–15)
BUN: 14 mg/dL (ref 6–20)
CO2: 23 mmol/L (ref 22–32)
Calcium: 8.2 mg/dL — ABNORMAL LOW (ref 8.9–10.3)
Chloride: 110 mmol/L (ref 98–111)
Creatinine, Ser: 0.57 mg/dL (ref 0.44–1.00)
GFR calc Af Amer: >60 mL/min (ref >60)
GFR calc non Af Amer: >60 mL/min (ref >60)
Glucose, Bld: 111 mg/dL — ABNORMAL HIGH (ref 70–99)
Potassium: 3.5 mmol/L (ref 3.5–5.1)
Sodium: 138 mmol/L (ref 135–145)

## 2018-08-10 LAB — NOVEL CORONAVIRUS, NAA (HOSP ORDER, SEND-OUT TO REF LAB; TAT 18-24 HRS): SARS-CoV-2, NAA: NOT DETECTED

## 2018-08-10 SURGERY — CYSTOURETEROSCOPY, WITH RETROGRADE PYELOGRAM AND STENT INSERTION
Anesthesia: General | Laterality: Left

## 2018-08-10 MED ORDER — CIPROFLOXACIN HCL 500 MG PO TABS: 500.0000 mg | ORAL_TABLET | Freq: Two times a day (BID) | ORAL | 0 refills | Status: AC

## 2018-08-10 MED ORDER — CIPROFLOXACIN HCL 500 MG PO TABS
500.0000 mg | ORAL_TABLET | Freq: Two times a day (BID) | ORAL | Status: DC
  Administered 2018-08-10: 500 mg via ORAL
  Filled 2018-08-10: qty 1

## 2018-08-10 MED ORDER — TAMSULOSIN HCL 0.4 MG PO CAPS: 0.4000 mg | ORAL_CAPSULE | Freq: Every day | ORAL | 0 refills | Status: DC

## 2018-08-10 NOTE — Progress Notes (Addendum)
Pharmacy Antibiotic Note  Gabrielle Roy is a 28 y.o. female admitted on 08/09/2018 with likely pyelonephritis .  Pharmacy has been consulted for cipro dosing. Pt is breastfeeding.  The American Academy of Pediatrics has classified cipro as compatible with breast feeding.  Cipro is excreted in human breast milk per Drugs in Pregnancy and Lactation, 10th edition. 2015.  It can disrupt the bowel flora of the infant. Monitor baby for diarrhea, thrush.  Plan: DC Rocephin Cipro 500 mg po BID x 5 days  Height: 5\' 1"  (154.9 cm) Weight: 183 lb (83 kg) IBW/kg (Calculated) : 47.8  Temp (24hrs), Avg:99.4 F (37.4 C), Min:98.4 F (36.9 C), Max:100.7 F (38.2 C)  Recent Labs  Lab 08/09/18 0215 08/10/18 0455  WBC 7.8 8.2  CREATININE 0.63 0.57  LATICACIDVEN 0.8  --     Estimated Creatinine Clearance: 104.1 mL/min (by C-G formula based on SCr of 0.57 mg/dL).    Allergies  Allergen Reactions  . Alcohol Hives    Allergic to alcohol containing products including cough medicines that contain alcohol  . Flagyl [Metronidazole] Other (See Comments)    Facial flushing  . Hydrocodone-Acetaminophen Nausea And Vomiting    Antimicrobials this admission:  Dose adjustments this admission: 6/3 CTX x 1 dose 6/3 Cipro> Microbiology results: 6/2 Ucx> 100 K  ESBL E Coli, sensitive to Cipro 6/2 SARS 2 negative 6/2 BCx2> ngtd  Thank you for allowing pharmacy to be a part of this patient's care.  Herby Abraham, Pharm.D 08/10/2018 9:06 AM

## 2018-08-10 NOTE — Consult Note (Signed)
Cc: We are following this patient for left hydronephrosis and pyelonephritis.  Interval: The patient states that she feels significantly better after receiving 36 hours of antibiotics.  Both her kidney and her bladder feeling better.  She did have a low-grade temperature overnight.  She has been transitioned to ciprofloxacin from Rocephin based on her urine culture results.  She is eager to get home today.  Review of systems: A 12 point comprehensive review of systems was obtained and is negative unless otherwise stated in the history of present illness.  Patient Active Problem List   Diagnosis Date Noted  . Acute pyelonephritis 08/09/2018  . History of cesarean section 07/08/2018  . Anemia in pregnancy 07/08/2018  . Late Entry into BRX opt schedule to limit social distancing due to COVID-19 05/30/2018  . History of preterm delivery 05/25/2018  . Threatened premature labor in third trimester 05/04/2018  . Renal stones 04/25/2018  . Urinary urgency 04/25/2018  . Symphysis pubis disruption, initial encounter 03/30/2018  . Back pain in pregnancy 03/28/2018  . Common migraine with intractable migraine 03/10/2018  . History of gestational hypertension 12/29/2017  . 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 Previous cesarean section complicating pregnancy, antepartum condition or complication 12/29/2017  . Postoperative intestinal malabsorption 06/07/2017  . 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 Former smoker 05/06/2017  . Obesity in pregnancy 11/06/2015  . Supervision of normal pregnancy 10/04/2015  . Anxiety state 09/29/2013  . Depressive disorder, not elsewhere classified 09/29/2013  . Gastroesophageal reflux disease without esophagitis 08/16/2013    No current facility-administered medications on file prior to encounter.    Current Outpatient Medications on File Prior to Encounter  Medication Sig Dispense Refill  . acetaminophen (TYLENOL) 325 MG tablet Take 650 mg  by mouth every 6 (six) hours as needed for mild pain or headache.    . ibuprofen (ADVIL) 800 MG tablet Take 800 mg by mouth every 8 (eight) hours as needed for fever, headache or moderate pain.     . traMADol (ULTRAM) 50 MG tablet Take 50 mg by mouth 2 (two) times daily as needed for severe pain.    . Catheters (SPECIMEN CATHETER FEMALE) MISC 1 Device by Does not apply route 4 (four) times daily. Pt is to use catheter QID for self cath 120 each 6    Past Medical History:  Diagnosis Date  . Anxiety   . Common migraine with intractable migraine 03/10/2018  . Depression   . Epigastric hernia   . History of kidney stones   . History of morbid obesity   . Hx of pre-eclampsia in prior pregnancy, currently pregnant, first trimester   . Hydronephrosis, left    Bilateral  . Kidney stone    Stent placed in Feb 2017  . PONV (postoperative nausea and vomiting)    nausea during last c section  . Supervision of normal pregnancy, antepartum 10/04/2015    Clinic  [facility] - transfer from Dr. Shawnie Pons Prenatal Labs Dating  Blood type:    Genetic Screen 1 Screen:    AFP:     Quad:     NIPS: Antibody:  Anatomic [place]  Nml fetal anatomy @ 18 wks; low lying placenta > resolved  wks Rubella:   GTT Early:               Third trimester:  RPR:    Flu vaccine  Declined HBsAg:    TDaP vaccine  Rhogam: HIV:    Baby Food  Breastfeed                                            GBS: (For PCN allergy, check sensitivities) Contraception  Pap: Circumcision  n/a female ([facility] ultrasound)  Pediatrician   Support Person  [name] (husband)      Past Surgical History:  Procedure Laterality Date  . CESAREAN SECTION    . CESAREAN SECTION N/A 02/20/2016   Procedure: REPEAT CESAREAN SECTION;  Surgeon: Hermina Staggers, MD;  Location: [facility] BIRTHING SUITES;  Service: Obstetrics;  Laterality: N/A;  . CESAREAN SECTION N/A 07/08/2018   Procedure: CESAREAN SECTION;  Surgeon: Allie Bossier, MD;   Location: [facility] LD ORS;  Service: Obstetrics;  Laterality: N/A;  . CYSTOSCOPY W/ URETERAL STENT PLACEMENT Left 04/26/2018   Procedure: CYSTOSCOPY WITH RETROGRADE PYELOGRAM/URETERAL STENT PLACEMENT;  Surgeon: Crist Fat, MD;  Location: [facility] ORS;  Service: Urology;  Laterality: Left;  . CYSTOSCOPY WITH STENT PLACEMENT    . FINGER SURGERY    . GASTRIC BYPASS      Social History   Tobacco Use  . Smoking status: Former Games developer  . Smokeless tobacco: Never Used  Substance Use Topics  . Alcohol use: No  . Drug use: No    Family History  Problem Relation Age of Onset  . Hypertension Father   . Heart disease Father   . Hearing loss Father     PE: Vitals:   08/09/18 2111 08/10/18 0238 08/10/18 0338 08/10/18 0548  BP: (!) 111/57   (!) 114/59  Pulse: 87   78  Resp: 18   18  Temp: 98.6 F (37 C) (!) 100.7 F (38.2 C) 99 F (37.2 C) 98.4 F (36.9 C)  TempSrc: Oral Oral  Oral  SpO2: 96%   97%  Weight:      Height:       Patient appears to be in no acute distress  patient is alert and oriented x3 Atraumatic normocephalic head No cervical or supraclavicular lymphadenopathy appreciated No increased work of breathing, no audible wheezes/rhonchi Lower extremities are symmetric without appreciable edema Grossly neurologically intact No identifiable skin lesions  Recent Labs    08/09/18 0215 08/10/18 0455  WBC 7.8 8.2  HGB 11.7* 10.1*  HCT 36.7 33.0*   Recent Labs    08/09/18 0215 08/10/18 0455  NA 138 138  K 3.5 3.5  CL 106 110  CO2 24 23  GLUCOSE 99 111*  BUN 11 14  CREATININE 0.63 0.57  CALCIUM 8.6* 8.2*   No results for input(s): LABPT, INR in the last 72 hours. No results for input(s): LABURIN in the last 72 hours. Results for orders placed or performed during the hospital encounter of 08/09/18  Blood culture (routine x 2)     Status: None (Preliminary result)   Collection Time: 08/09/18  2:15 AM  Result Value Ref Range Status   Specimen Description    Final    BLOOD LEFT ANTECUBITAL Performed at [facility], [address], [place], [place] [address]    Special Requests   Final    BOTTLES DRAWN AEROBIC AND ANAEROBIC Blood Culture adequate volume Performed at [facility], [address] [place], [place] [address]    Culture   Final    NO GROWTH 1 DAY Performed at  [facility], [address], [place], [place] [address]    Report Status PENDING  Incomplete  Urine culture     Status: Abnormal   Collection Time: 08/09/18  2:15 AM  Result Value Ref Range Status   Specimen Description   Final    URINE, RANDOM Performed at [facility], [address], [place], [place] [address]    Special Requests   Final    NONE Performed at [facility], [address] [place], [place] [address]    Culture (A)  Final    >=100,000 COLONIES/mL ESCHERICHIA COLI Confirmed Extended Spectrum Beta-Lactamase Producer (ESBL).  In bloodstream infections from ESBL organisms, carbapenems are preferred over piperacillin/tazobactam. They are shown to have a lower risk of mortality.    Report Status 08/10/2018 FINAL  Final   Organism ID, Bacteria ESCHERICHIA COLI (A)  Final      Susceptibility   Escherichia coli - MIC*    AMPICILLIN >=32 RESISTANT Resistant     CEFAZOLIN >=64 RESISTANT Resistant     CEFTRIAXONE >=64 RESISTANT Resistant     CIPROFLOXACIN <=0.25 SENSITIVE Sensitive     GENTAMICIN >=16 RESISTANT Resistant     IMIPENEM <=0.25 SENSITIVE Sensitive     NITROFURANTOIN <=16 SENSITIVE Sensitive     TRIMETH/SULFA <=20 SENSITIVE Sensitive     AMPICILLIN/SULBACTAM 16 INTERMEDIATE Intermediate     PIP/TAZO <=4 SENSITIVE Sensitive     Extended ESBL POSITIVE Resistant     * >=100,000 COLONIES/mL ESCHERICHIA COLI  Blood culture (routine x 2)     Status: None (Preliminary result)   Collection Time: 08/09/18  2:16 AM  Result Value Ref Range Status   Specimen Description    Final    BLOOD BLOOD LEFT HAND Performed at [facility], [address], [place], [place] [address]    Special Requests   Final    BOTTLES DRAWN AEROBIC AND ANAEROBIC Blood Culture adequate volume Performed at [facility], [address] [place], [place] [address]    Culture   Final    NO GROWTH 1 DAY Performed at [facility], [address], [place], [place] [address]    Report Status PENDING  Incomplete  SARS Coronavirus 2 (CEPHEID - Performed in [facility] hospital lab), Hosp Order     Status: None   Collection Time: 08/09/18  8:19 AM  Result Value Ref Range Status   SARS Coronavirus 2 NEGATIVE NEGATIVE Final    Comment: (NOTE) If result is NEGATIVE SARS-CoV-2 target nucleic acids are NOT DETECTED. The SARS-CoV-2 RNA is generally detectable in upper and lower  respiratory specimens during the acute phase of infection. The lowest  concentration of SARS-CoV-2 viral copies this assay can detect is 250  copies / mL. A negative result does not preclude SARS-CoV-2 infection  and should not be used as the sole basis for treatment or other  patient management decisions.  A negative result may occur with  improper specimen collection / handling, submission of specimen other  than nasopharyngeal swab, presence of viral mutation(s) within the  areas targeted by this assay, and inadequate number of viral copies  (<250 copies / mL). A negative result must be combined with clinical  observations, patient history, and epidemiological information. If result is POSITIVE SARS-CoV-2 target nucleic acids are DETECTED. The SARS-CoV-2 RNA is generally detectable in upper and lower  respiratory specimens dur ing the acute phase of infection.  Positive  results are  indicative of active infection with SARS-CoV-2.  Clinical  correlation with patient history and other diagnostic information is  necessary to determine patient infection status.  Positive  results do  not rule out bacterial infection or co-infection with other viruses. If result is PRESUMPTIVE POSTIVE SARS-CoV-2 nucleic acids MAY BE PRESENT.   A presumptive positive result was obtained on the submitted specimen  and confirmed on repeat testing.  While 2019 novel coronavirus  (SARS-CoV-2) nucleic acids may be present in the submitted sample  additional confirmatory testing may be necessary for epidemiological  and / or clinical management purposes  to differentiate between  SARS-CoV-2 and other Sarbecovirus currently known to infect humans.  If clinically indicated additional testing with an alternate test  methodology [id number]) is advised. The SARS-CoV-2 RNA is generally  detectable in upper and lower respiratory sp ecimens during the acute  phase of infection. The expected result is Negative. Fact Sheet for Patients:  BoilerBrush.com.cyhttps://www.fda.gov/media/136312/download Fact Sheet for Healthcare Providers: https://pope.com/https://www.fda.gov/media/136313/download This test is not yet approved or cleared by the [place] FDA and has been authorized for detection and/or diagnosis of SARS-CoV-2 by FDA under an Emergency Use Authorization (EUA).  This EUA will remain in effect (meaning this test can be used) for the duration of the COVID-19 declaration under Section 564(b)(1) of the Act, 21 U.S.C. section 360bbb-3(b)(1), unless the authorization is terminated or revoked sooner. Performed at [facility], [address] [place], [place] [id number]     Imaging: I reviewed the patient's CT scan which demonstrates no appreciable kidney stone or ureteral stones.  The stent is well-positioned.  She did have some hydronephrosis, but the bladder was also full.  Imp: The patient has left-sided pyelonephritis with associated hydroureteronephrosis.  She is underlying voiding dysfunction and a history of kidney stones.  Fortunately she appears to have passed her kidney  stone.  Recommendations: I would have like to have removed her stent today in the hospital prior to discharge, but given that she had a low-grade temperature overnight I would recommend that she be discharged home today and follow-up with us tomorrow.  If she has been afebrile over the next interval we can remove her stent in clinic.   Crist FatBenjamin W Carisma Troupe

## 2018-08-10 NOTE — Progress Notes (Signed)
Pt discharged to home, instructions and appointments reviewed with patient, acknowledged understanding. Pt has not take any pain meds since last night. Pt plans to drive herself home and feel safe and capable of driving. SRP, RN

## 2018-08-10 NOTE — Discharge Summary (Signed)
Physician Discharge Summary  Gabrielle Roy JYN:[id number] DOB: 10/26/1990 DOA: 08/09/2018  PCP: Patient, No Pcp Per  Admit date: 08/09/2018 Discharge date: 08/10/2018  Admitted From: Home Disposition:  Home  Recommendations for Outpatient Follow-up:  1. Follow up with Urology as scheduled 6/4  2. Please follow up on the following pending results: Final blood culture result, negative on day of discharge   Discharge Condition: Stable CODE STATUS: Full  Diet recommendation: Regular   Brief/Interim Summary: From H&P by Dr. Caleb PoppNettey: Gabrielle FriendsMelissa A Lacount is a 28 y.o. female with medical history significant of kidney stones, left hydronephrosis s/p left ureteral stent, anxiety, depression, currently post-partum.  Patient presented secondary to development of fever and severe sharp left flank pain that started yesterday evening around 11:00 AM.  T-max of 102 F.  She took some Tylenol which did not help with her pain but did help with her fever.  Her pain was nonradiating and remained persistent.  Pain worsened when she tried to urinate but she denies dysuria.  ED Course: Vitals: Afebrile, pulse of 80, RR of 16, BP of 128/63, 100% on room Labs: Hgb 11.7 Imaging: CT renal stone study significant for mild left hydronephrosis Medications/Course: Ceftriaxone, NS bolus, Dilaudid IV, Toradol IV  Interim: Patient was started on empiric Rocephin, urology was consulted.  Due to her current infection, they elected to remove her stent as an outpatient.  Urine culture resulted with ESBL E. coli sensitive to Cipro.  Discussed with pharmacy for dosing as well as recommendation for breast-feeding. The American Academy of Pediatrics has classified cipro as compatible with breast feeding.  Cipro is excreted in human breast milk per Drugs in Pregnancy and Lactation, 10th edition. 2015.  It can disrupt the bowel flora of the infant. Monitor baby for diarrhea, thrush.  This was discussed with patient.  On day of discharge,  patient was eager to get home.  She did have a low-grade fever overnight, but currently feeling well without any worsening pain.  She has a newborn baby at home and would like to go home and manage her symptoms, and follow-up with urology in the office as scheduled tomorrow 6/4.  Discharge Diagnoses:  Principal Problem:   Acute pyelonephritis Active Problems:   Hydronephrosis, left   Discharge Instructions  Discharge Instructions    Call MD for:  difficulty breathing, headache or visual disturbances   Complete by:  As directed    Call MD for:  extreme fatigue   Complete by:  As directed    Call MD for:  persistant dizziness or light-headedness   Complete by:  As directed    Call MD for:  persistant nausea and vomiting   Complete by:  As directed    Call MD for:  severe uncontrolled pain   Complete by:  As directed    Call MD for:  temperature >100.4   Complete by:  As directed    Diet general   Complete by:  As directed    Discharge instructions   Complete by:  As directed    You were cared for by a hospitalist during your hospital stay. If you have any questions about your discharge medications or the care you received while you were in the hospital after you are discharged, you can call the unit and ask to speak with the hospitalist on call if the hospitalist that took care of you is not available. Once you are discharged, your primary care physician will handle any further medical issues. Please  note that NO REFILLS for any discharge medications will be authorized once you are discharged, as it is imperative that you return to your primary care physician (or establish a relationship with a primary care physician if you do not have one) for your aftercare needs so that they can reassess your need for medications and monitor your lab values.   Increase activity slowly   Complete by:  As directed      Allergies as of 08/10/2018      Reactions   Alcohol Hives   Allergic to alcohol  containing products including cough medicines that contain alcohol   Flagyl [metronidazole] Other (See Comments)   Facial flushing   Hydrocodone-acetaminophen Nausea And Vomiting      Medication List    TAKE these medications   acetaminophen 325 MG tablet Commonly known as:  TYLENOL Take 650 mg by mouth every 6 (six) hours as needed for mild pain or headache.   ciprofloxacin 500 MG tablet Commonly known as:  CIPRO Take 1 tablet (500 mg total) by mouth 2 (two) times daily for 14 days.   ibuprofen 800 MG tablet Commonly known as:  ADVIL Take 800 mg by mouth every 8 (eight) hours as needed for fever, headache or moderate pain.   Specimen Catheter Female Misc 1 Device by Does not apply route 4 (four) times daily. Pt is to use catheter QID for self cath   tamsulosin 0.4 MG Caps capsule Commonly known as:  FLOMAX Take 1 capsule (0.4 mg total) by mouth daily. Start taking on:  August 11, 2018   traMADol 50 MG tablet Commonly known as:  ULTRAM Take 50 mg by mouth 2 (two) times daily as needed for severe pain.      Follow-up Information    [facility]. Go on 08/11/2018.   Contact information: [address] [place] [address] [phone]         Allergies  Allergen Reactions  . Alcohol Hives    Allergic to alcohol containing products including cough medicines that contain alcohol  . Flagyl [Metronidazole] Other (See Comments)    Facial flushing  . Hydrocodone-Acetaminophen Nausea And Vomiting    Consultations:  Urology    Procedures/Studies: Dg Abd 1 View  Result Date: 08/09/2018 CLINICAL DATA:  Ureteral stent EXAM: ABDOMEN - 1 VIEW COMPARISON:  None. FINDINGS: There is a left-sided double-J ureteral stent in place. The alignment appears grossly appropriate. Exact positioning cannot be determined on this exam. There is a moderate amount of stool in the colon. The bowel gas pattern is nonobstructive. Multiple surgical staples project  over the left upper quadrant. IMPRESSION: Grossly well-positioned ureteral stent on the left. Electronically Signed   By: Katherine Mantle M.D.   On: 08/09/2018 03:21   Ct Renal Stone Study  Result Date: 08/09/2018 CLINICAL DATA:  Fever and left flank pain EXAM: CT ABDOMEN AND PELVIS WITHOUT CONTRAST TECHNIQUE: Multidetector CT imaging of the abdomen and pelvis was performed following the standard protocol without IV contrast. COMPARISON:  Radiography from earlier today. 04/27/2015 abdominal CT FINDINGS: Lower chest:  No contributory findings. Hepatobiliary: No focal liver abnormality.No evidence of biliary obstruction or stone. Pancreas: Unremarkable. Spleen: Unremarkable. Adrenals/Urinary Tract: Negative adrenals. Mild left hydronephrosis despite a well-positioned internal ureteral stent. Thread/wire again seen at the lower stent which is straight. No underlying stone is seen. Unremarkable bladder. Stomach/Bowel:  No obstruction. No appendicitis.  Gastric bypass. Vascular/Lymphatic: No acute vascular abnormality. No mass or adenopathy. Reproductive:Negative.  Cesarean section scar. Other: No ascites or pneumoperitoneum. Fatty midline and infraumbilical hernia. Musculoskeletal: No acute abnormalities. IMPRESSION: 1. Mild left hydronephrosis despite stable positioning of the ureteral stent. No visible urolithiasis. 2. Otherwise stable, as described. Electronically Signed   By: Marnee Spring M.D.   On: 08/09/2018 06:02      Discharge Exam: Vitals:   08/10/18 0338 08/10/18 0548  BP:  (!) 114/59  Pulse:  78  Resp:  18  Temp: 99 F (37.2 C) 98.4 F (36.9 C)  SpO2:  97%    General: Pt is alert, awake, not in acute distress Cardiovascular: RRR, S1/S2 +, no rubs, no gallops Respiratory: CTA bilaterally, no wheezing, no rhonchi Abdominal: Soft, NT, ND, bowel sounds + Extremities: no edema, no cyanosis    The results of significant diagnostics from this hospitalization (including imaging,  microbiology, ancillary and laboratory) are listed below for reference.     Microbiology: Recent Results (from the past 240 hour(s))  Blood culture (routine x 2)     Status: None (Preliminary result)   Collection Time: 08/09/18  2:15 AM  Result Value Ref Range Status   Specimen Description   Final    BLOOD LEFT ANTECUBITAL Performed at [facility], [address], [place], [place] [address]    Special Requests   Final    BOTTLES DRAWN AEROBIC AND ANAEROBIC Blood Culture adequate volume Performed at [facility], [address] [place], [place] [address]    Culture   Final    NO GROWTH 1 DAY Performed at [facility], [address], [place], [place] [address]    Report Status PENDING  Incomplete  Urine culture     Status: Abnormal   Collection Time: 08/09/18  2:15 AM  Result Value Ref Range Status   Specimen Description   Final    URINE, RANDOM Performed at [facility], [address], [place], [place] [address]    Special Requests   Final    NONE Performed at [facility], [address], [place], [place] [address]    Culture (A)  Final    >=100,000 COLONIES/mL ESCHERICHIA COLI Confirmed Extended Spectrum Beta-Lactamase Producer (ESBL).  In bloodstream infections from ESBL organisms, carbapenems are preferred over piperacillin/tazobactam. They are shown to have a lower risk of mortality.    Report Status 08/10/2018 FINAL  Final   Organism ID, Bacteria ESCHERICHIA COLI (A)  Final      Susceptibility   Escherichia coli - MIC*    AMPICILLIN >=32 RESISTANT Resistant     CEFAZOLIN >=64 RESISTANT Resistant     CEFTRIAXONE >=64 RESISTANT Resistant     CIPROFLOXACIN <=0.25 SENSITIVE Sensitive     GENTAMICIN >=16 RESISTANT Resistant     IMIPENEM <=0.25 SENSITIVE Sensitive     NITROFURANTOIN <=16 SENSITIVE Sensitive     TRIMETH/SULFA <=20 SENSITIVE Sensitive     AMPICILLIN/SULBACTAM 16  INTERMEDIATE Intermediate     PIP/TAZO <=4 SENSITIVE Sensitive     Extended ESBL POSITIVE Resistant     * >=100,000 COLONIES/mL ESCHERICHIA COLI  Blood culture (routine x 2)     Status: None (Preliminary result)   Collection Time: 08/09/18  2:16 AM  Result Value Ref Range Status   Specimen Description   Final    BLOOD BLOOD LEFT HAND Performed at [facility], [address] [place], [place] [address]    Special Requests   Final    BOTTLES DRAWN AEROBIC  AND ANAEROBIC Blood Culture adequate volume Performed at [facility], [address] [place], [place] [address]    Culture   Final    NO GROWTH 1 DAY Performed at [facility], [address] [place], [place] [address]    Report Status PENDING  Incomplete  SARS Coronavirus 2 (CEPHEID - Performed in [facility] hospital lab), Hosp Order     Status: None   Collection Time: 08/09/18  8:19 AM  Result Value Ref Range Status   SARS Coronavirus 2 NEGATIVE NEGATIVE Final    Comment: (NOTE) If result is NEGATIVE SARS-CoV-2 target nucleic acids are NOT DETECTED. The SARS-CoV-2 RNA is generally detectable in upper and lower  respiratory specimens during the acute phase of infection. The lowest  concentration of SARS-CoV-2 viral copies this assay can detect is 250  copies / mL. A negative result does not preclude SARS-CoV-2 infection  and should not be used as the sole basis for treatment or other  patient management decisions.  A negative result may occur with  improper specimen collection / handling, submission of specimen other  than nasopharyngeal swab, presence of viral mutation(s) within the  areas targeted by this assay, and inadequate number of viral copies  (<250 copies / mL). A negative result must be combined with clinical  observations, patient history, and epidemiological information. If result is POSITIVE SARS-CoV-2 target nucleic acids are DETECTED. The SARS-CoV-2 RNA is  generally detectable in upper and lower  respiratory specimens dur ing the acute phase of infection.  Positive  results are indicative of active infection with SARS-CoV-2.  Clinical  correlation with patient history and other diagnostic information is  necessary to determine patient infection status.  Positive results do  not rule out bacterial infection or co-infection with other viruses. If result is PRESUMPTIVE POSTIVE SARS-CoV-2 nucleic acids MAY BE PRESENT.   A presumptive positive result was obtained on the submitted specimen  and confirmed on repeat testing.  While 2019 novel coronavirus  (SARS-CoV-2) nucleic acids may be present in the submitted sample  additional confirmatory testing may be necessary for epidemiological  and / or clinical management purposes  to differentiate between  SARS-CoV-2 and other Sarbecovirus currently known to infect humans.  If clinically indicated additional testing with an alternate test  methodology [phone]) is advised. The SARS-CoV-2 RNA is generally  detectable in upper and lower respiratory sp ecimens during the acute  phase of infection. The expected result is Negative. Fact Sheet for Patients:  BoilerBrush.com.cy Fact Sheet for Healthcare Providers: https://pope.com/ This test is not yet approved or cleared by the [place] FDA and has been authorized for detection and/or diagnosis of SARS-CoV-2 by FDA under an Emergency Use Authorization (EUA).  This EUA will remain in effect (meaning this test can be used) for the duration of the COVID-19 declaration under Section 564(b)(1) of the Act, 21 U.S.C. section 360bbb-3(b)(1), unless the authorization is terminated or revoked sooner. Performed at [facility], [address], [place], [place] [address]      Labs: BNP (last 3 results) No results for input(s): BNP in the last 8760 hours. Basic Metabolic Panel: Recent  Labs  Lab 08/09/18 0215 08/10/18 0455  NA 138 138  K 3.5 3.5  CL 106 110  CO2 24 23  GLUCOSE 99 111*  BUN 11 14  CREATININE 0.63 0.57  CALCIUM 8.6* 8.2*   Liver Function Tests: No results for input(s): AST, ALT, ALKPHOS, BILITOT, PROT, ALBUMIN in  the last 168 hours. No results for input(s): LIPASE, AMYLASE in the last 168 hours. No results for input(s): AMMONIA in the last 168 hours. CBC: Recent Labs  Lab 08/09/18 0215 08/10/18 0455  WBC 7.8 8.2  NEUTROABS 5.3  --   HGB 11.7* 10.1*  HCT 36.7 33.0*  MCV 101.4* 102.8*  PLT 193 153   Cardiac Enzymes: No results for input(s): CKTOTAL, CKMB, CKMBINDEX, TROPONINI in the last 168 hours. BNP: Invalid input(s): POCBNP CBG: No results for input(s): GLUCAP in the last 168 hours. D-Dimer No results for input(s): DDIMER in the last 72 hours. Hgb A1c No results for input(s): HGBA1C in the last 72 hours. Lipid Profile No results for input(s): CHOL, HDL, LDLCALC, TRIG, CHOLHDL, LDLDIRECT in the last 72 hours. Thyroid function studies No results for input(s): TSH, T4TOTAL, T3FREE, THYROIDAB in the last 72 hours.  Invalid input(s): FREET3 Anemia work up No results for input(s): VITAMINB12, FOLATE, FERRITIN, TIBC, IRON, RETICCTPCT in the last 72 hours. Urinalysis    Component Value Date/Time   COLORURINE YELLOW 08/09/2018 0215   APPEARANCEUR TURBID (A) 08/09/2018 0215   LABSPEC 1.008 08/09/2018 0215   PHURINE 6.0 08/09/2018 0215   GLUCOSEU NEGATIVE 08/09/2018 0215   HGBUR LARGE (A) 08/09/2018 0215   BILIRUBINUR NEGATIVE 08/09/2018 0215   BILIRUBINUR neg 04/25/2018 1020   KETONESUR NEGATIVE 08/09/2018 0215   PROTEINUR 100 (A) 08/09/2018 0215   UROBILINOGEN 0.2 04/25/2018 1020   NITRITE POSITIVE (A) 08/09/2018 0215   LEUKOCYTESUR LARGE (A) 08/09/2018 0215   Sepsis Labs Invalid input(s): PROCALCITONIN,  WBC,  LACTICIDVEN Microbiology Recent Results (from the past 240 hour(s))  Blood culture (routine x 2)     Status: None  (Preliminary result)   Collection Time: 08/09/18  2:15 AM  Result Value Ref Range Status   Specimen Description   Final    BLOOD LEFT ANTECUBITAL Performed at [facility], [address], [place], [place] [address]    Special Requests   Final    BOTTLES DRAWN AEROBIC AND ANAEROBIC Blood Culture adequate volume Performed at [facility], [address] [place], [place] [address]    Culture   Final    NO GROWTH 1 DAY Performed at [facility], [address] [place], [place] [address]    Report Status PENDING  Incomplete  Urine culture     Status: Abnormal   Collection Time: 08/09/18  2:15 AM  Result Value Ref Range Status   Specimen Description   Final    URINE, RANDOM Performed at [facility], [address] [place], [place] [address]    Special Requests   Final    NONE Performed at [facility], [address] [place], [place] [address]    Culture (A)  Final    >=100,000 COLONIES/mL ESCHERICHIA COLI Confirmed Extended Spectrum Beta-Lactamase Producer (ESBL).  In bloodstream infections from ESBL organisms, carbapenems are preferred over piperacillin/tazobactam. They are shown to have a lower risk of mortality.    Report Status 08/10/2018 FINAL  Final   Organism ID, Bacteria ESCHERICHIA COLI (A)  Final      Susceptibility   Escherichia coli - MIC*    AMPICILLIN >=32 RESISTANT Resistant     CEFAZOLIN >=64 RESISTANT Resistant     CEFTRIAXONE >=64 RESISTANT Resistant     CIPROFLOXACIN <=0.25 SENSITIVE Sensitive     GENTAMICIN >=16 RESISTANT Resistant     IMIPENEM <=0.25 SENSITIVE Sensitive     NITROFURANTOIN <=  16 SENSITIVE Sensitive     TRIMETH/SULFA <=20 SENSITIVE Sensitive     AMPICILLIN/SULBACTAM 16 INTERMEDIATE Intermediate     PIP/TAZO <=4 SENSITIVE Sensitive     Extended ESBL POSITIVE Resistant     * >=100,000 COLONIES/mL ESCHERICHIA COLI  Blood culture (routine x 2)     Status:  None (Preliminary result)   Collection Time: 08/09/18  2:16 AM  Result Value Ref Range Status   Specimen Description   Final    BLOOD BLOOD LEFT HAND Performed at [facility], [address], [place], [place] [address]    Special Requests   Final    BOTTLES DRAWN AEROBIC AND ANAEROBIC Blood Culture adequate volume Performed at [facility], [address], [place], [place] [address]    Culture   Final    NO GROWTH 1 DAY Performed at [facility], [address] [place], [place] [address]    Report Status PENDING  Incomplete  SARS Coronavirus 2 (CEPHEID - Performed in [facility] hospital lab), Hosp Order     Status: None   Collection Time: 08/09/18  8:19 AM  Result Value Ref Range Status   SARS Coronavirus 2 NEGATIVE NEGATIVE Final    Comment: (NOTE) If result is NEGATIVE SARS-CoV-2 target nucleic acids are NOT DETECTED. The SARS-CoV-2 RNA is generally detectable in upper and lower  respiratory specimens during the acute phase of infection. The lowest  concentration of SARS-CoV-2 viral copies this assay can detect is 250  copies / mL. A negative result does not preclude SARS-CoV-2 infection  and should not be used as the sole basis for treatment or other  patient management decisions.  A negative result may occur with  improper specimen collection / handling, submission of specimen other  than nasopharyngeal swab, presence of viral mutation(s) within the  areas targeted by this assay, and inadequate number of viral copies  (<250 copies / mL). A negative result must be combined with clinical  observations, patient history, and epidemiological information. If result is POSITIVE SARS-CoV-2 target nucleic acids are DETECTED. The SARS-CoV-2 RNA is generally detectable in upper and lower  respiratory specimens dur ing the acute phase of infection.  Positive  results are indicative of active infection with SARS-CoV-2.  Clinical   correlation with patient history and other diagnostic information is  necessary to determine patient infection status.  Positive results do  not rule out bacterial infection or co-infection with other viruses. If result is PRESUMPTIVE POSTIVE SARS-CoV-2 nucleic acids MAY BE PRESENT.   A presumptive positive result was obtained on the submitted specimen  and confirmed on repeat testing.  While 2019 novel coronavirus  (SARS-CoV-2) nucleic acids may be present in the submitted sample  additional confirmatory testing may be necessary for epidemiological  and / or clinical management purposes  to differentiate between  SARS-CoV-2 and other Sarbecovirus currently known to infect humans.  If clinically indicated additional testing with an alternate test  methodology [phone]) is advised. The SARS-CoV-2 RNA is generally  detectable in upper and lower respiratory sp ecimens during the acute  phase of infection. The expected result is Negative. Fact Sheet for Patients:  BoilerBrush.com.cy Fact Sheet for Healthcare Providers: https://pope.com/ This test is not yet approved or cleared by the [place] FDA and has been authorized for detection and/or diagnosis of SARS-CoV-2 by FDA under an Emergency Use Authorization (EUA).  This EUA will remain in effect (meaning this test can be used) for the duration of  the COVID-19 declaration under Section 564(b)(1) of the Act, 21 U.S.C. section 360bbb-3(b)(1), unless the authorization is terminated or revoked sooner. Performed at [facility], [address], [place], [place] [address]      Patient was seen and examined on the day of discharge and was found to be in stable condition. Time coordinating discharge: 40 minutes including assessment and coordination of care, as well as examination of the patient.   SIGNED:  Noralee Stain, DO [facility] [email or web address] 08/10/2018,  11:51 AM

## 2018-08-11 ENCOUNTER — Encounter: Payer: Self-pay | Admitting: Certified Nurse Midwife

## 2018-08-12 ENCOUNTER — Other Ambulatory Visit: Payer: Self-pay

## 2018-08-12 ENCOUNTER — Ambulatory Visit (INDEPENDENT_AMBULATORY_CARE_PROVIDER_SITE_OTHER): Admitting: Certified Nurse Midwife

## 2018-08-12 ENCOUNTER — Encounter: Payer: Self-pay | Admitting: Certified Nurse Midwife

## 2018-08-12 DIAGNOSIS — O99345 Other mental disorders complicating the puerperium: Secondary | ICD-10-CM

## 2018-08-12 DIAGNOSIS — Z3009 Encounter for other general counseling and advice on contraception: Secondary | ICD-10-CM

## 2018-08-12 DIAGNOSIS — Z1389 Encounter for screening for other disorder: Secondary | ICD-10-CM

## 2018-08-12 DIAGNOSIS — F53 Postpartum depression: Secondary | ICD-10-CM

## 2018-08-12 IMAGING — US US MFM OB FOLLOW-UP
1 series · 13 of 28 positions shown · non-contrast
Comparison: none

[Series 1: us mfm ob follow-up · 13 of 59 slices shown]
[im 3/59]
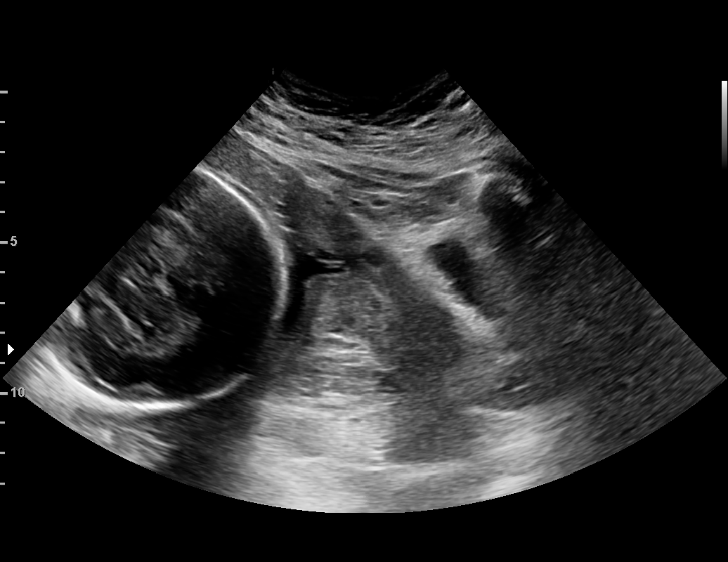
[im 7/59]
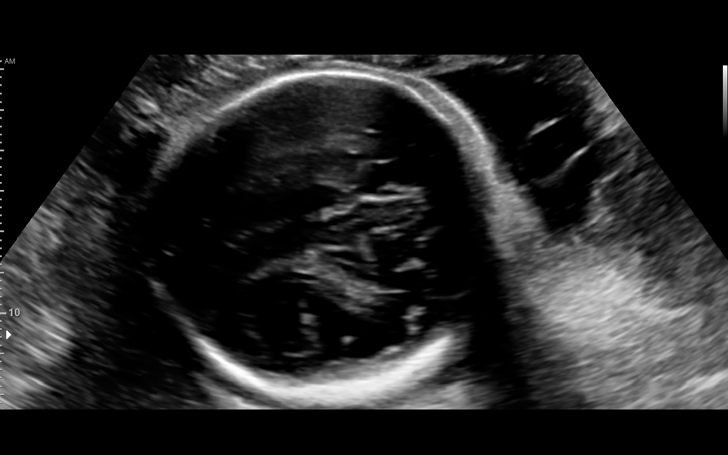
[im 11/59]
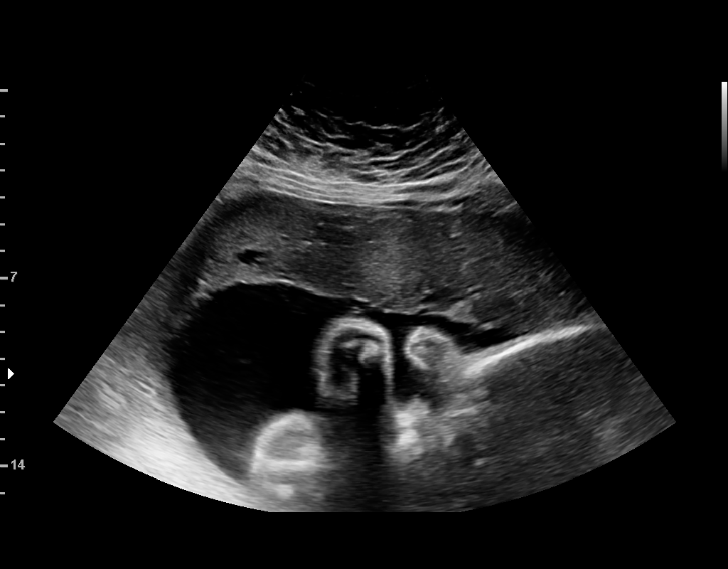
[im 16/59]
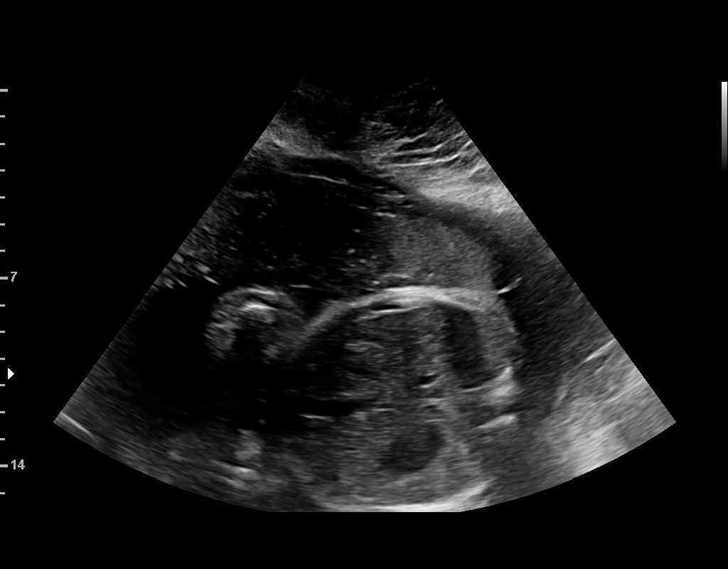
[im 20/59]
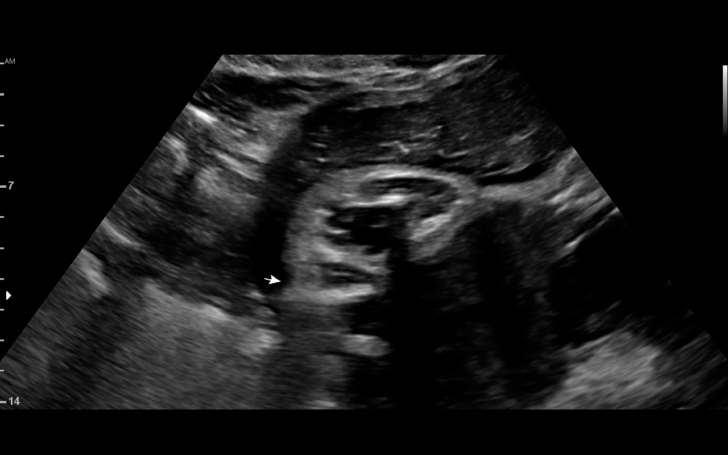
[im 24/59]
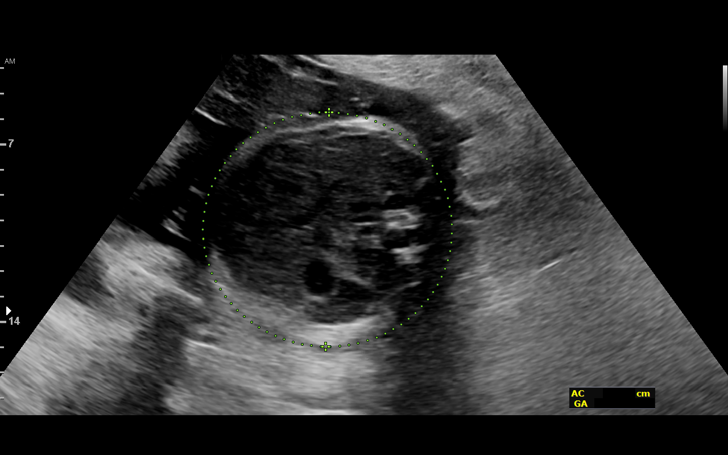
[im 31/59]
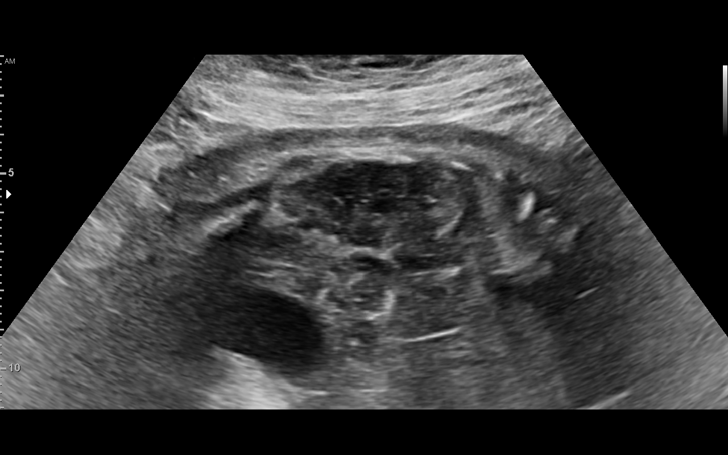
[im 35/59]
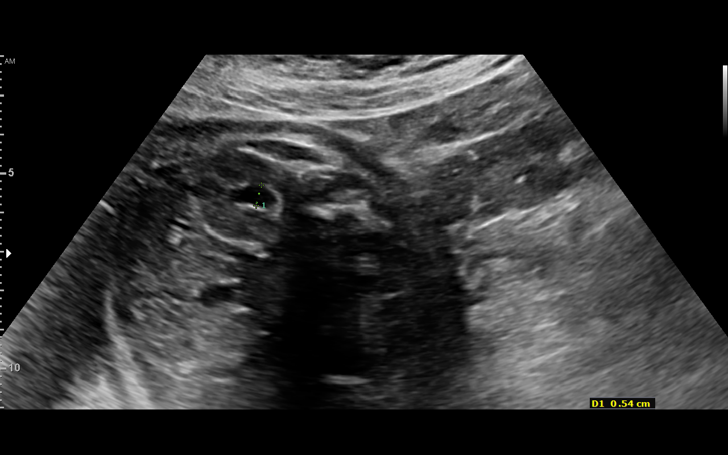
[im 39/59]
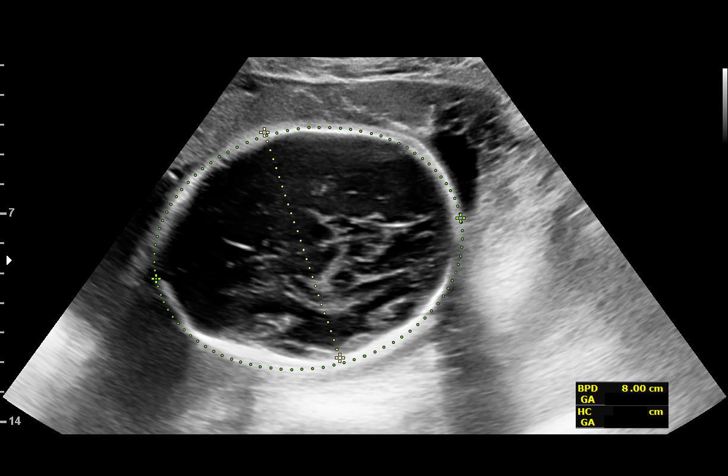
[im 43/59]
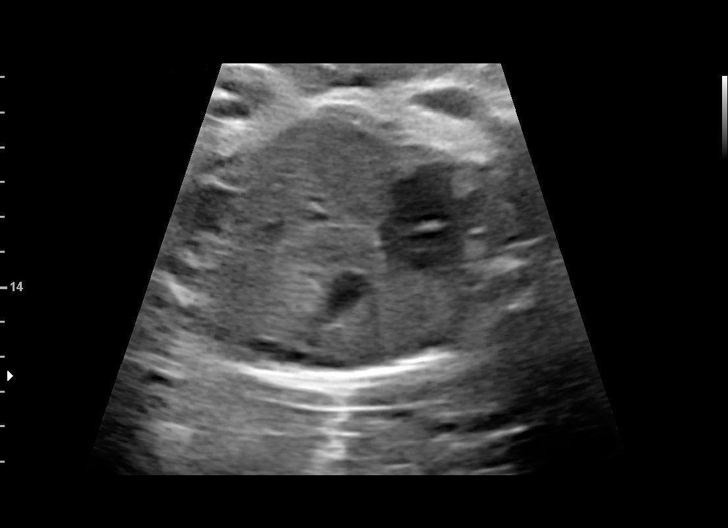
[im 48/59]
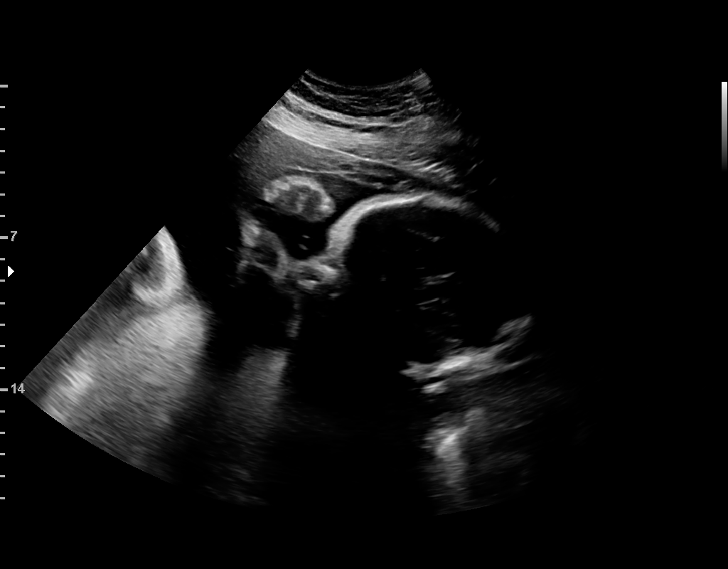
[im 52/59]
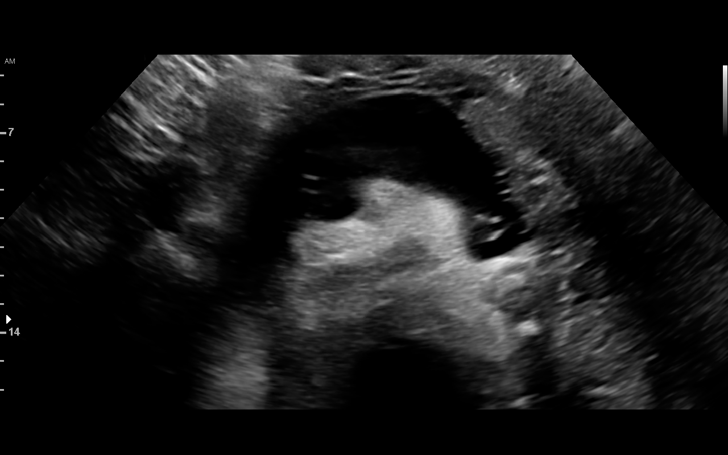
[im 56/59]
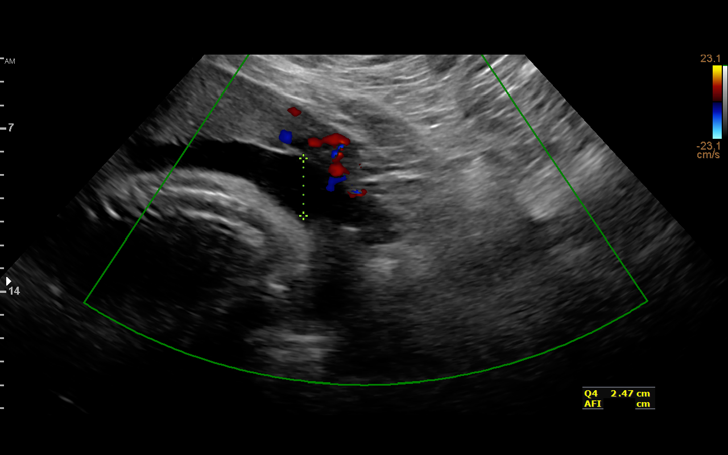

[13 of 28 positions shown; findings below may reference images not displayed]

YIXIAN
Indications

30 weeks gestation of pregnancy
Encounter for other antenatal screening
follow-up
Poor obstetric history: Previous preterm
delivery 35 weeks(oligo, abs)
Obesity complicating pregnancy, third
trimester
History of cesarean delivery, currently
pregnant
OB History

Blood Type:            Height:  5'3"   Weight (lb):  282      BMI:
Gravidity:    2         Term:   1        Prem:   0        SAB:   0
TOP:          0       Ectopic:  0        Living: 1
Fetal Evaluation

Num Of Fetuses:     1
Fetal Heart         145
Rate(bpm):
Cardiac Activity:   Observed
Presentation:       Cephalic
Placenta:           Anterior, above cervical os
P. Cord Insertion:  Visualized, central
Amniotic Fluid
AFI FV:      Subjectively within normal limits

AFI Sum(cm)     %Tile       Largest Pocket(cm)
18.57           71

RUQ(cm)       RLQ(cm)       LUQ(cm)        LLQ(cm)
4.66
Biometry

BPD:      79.3  mm     G. Age:  31w 6d         88  %    CI:        72.01   %   70 - 86
FL/HC:      19.7   %   19.2 -
HC:      297.4  mm     G. Age:  33w 0d         90  %    HC/AC:      1.00       0.99 -
AC:       298   mm     G. Age:  33w 6d       > 97  %    FL/BPD:     73.9   %   71 - 87
FL:       58.6  mm     G. Age:  30w 4d         53  %    FL/AC:      19.7   %   20 - 24

Est. FW:    7707  gm      4 lb 7 oz     87  %
Gestational Age

LMP:           30w 0d       Date:   05/28/15                 EDD:   03/03/16
U/S Today:     32w 2d                                        EDD:   02/16/16
Best:          30w 0d    Det. By:   LMP  (05/28/15)          EDD:   03/03/16
Anatomy

Cranium:               Appears normal         Aortic Arch:            Previously seen
Cavum:                 Appears normal         Ductal Arch:            Previously seen
Ventricles:            Appears normal         Diaphragm:              Appears normal
Choroid Plexus:        Previously seen        Stomach:                Appears normal, left
sided
Cerebellum:            Previously seen        Abdomen:                Appears normal
Posterior Fossa:       Previously seen        Abdominal Wall:         Previously seen
Nuchal Fold:           Not applicable (>20    Cord Vessels:           Previously seen
wks GA)
Face:                  Orbits and profile     Kidneys:                Appear normal
previously seen
Lips:                  Appears normal         Bladder:                Appears normal
Thoracic:              Appears normal         Spine:                  Previously seen
Heart:                 Not well visualized    Upper Extremities:      Previously seen
RVOT:                  Appears normal         Lower Extremities:      Previously seen
LVOT:                  Previously seen

Other:  Fetus appears to be a female. Technically difficult due to maternal
habitus and fetal position.
Cervix Uterus Adnexa

Cervix
Length:            4.5  cm.
Normal appearance by transabdominal scan.

Uterus
No abnormality visualized.

Left Ovary
Not visualized.
Right Ovary
Within normal limits.

Adnexa:       No abnormality visualized.
Impression

Single IUP at 30w 0d
Somewhat limited views of the fetal anatomy obtained due to
advanced gestational age and fetal position
No gross anomalies noted
The estimated fetal weight today is at th 87th %tile; the AC
measures > 97th %tile
Normal amniotic fluid volume
Recommendations

Consider ultrasound for growth in 4 weeks given currentl
estimated fetal weight
Otherwise, follow up ultrasounds as clinically indicated

## 2018-08-12 MED ORDER — SERTRALINE HCL 50 MG PO TABS: 50.0000 mg | ORAL_TABLET | Freq: Every day | ORAL | 1 refills | Status: DC

## 2018-08-12 NOTE — Progress Notes (Signed)
TELEHEALTH POSTPARTUM VIRTUAL VIDEO VISIT ENCOUNTER NOTE  Provider location: [facility] [place]   I connected with Gabrielle Roy on 08/12/18 at 10:00 AM EDT by WebEx Video Encounter at home and verified that I am speaking with the correct person using two identifiers.    I discussed the limitations, risks, security and privacy concerns of performing an evaluation and management service by telephone and the availability of in person appointments. I also discussed with the patient that there may be a patient responsible charge related to this service. The patient expressed understanding and agreed to proceed.  Appointment Date: 08/12/2018  [facility]  Chief Complaint: Postpartum Visit  History of Present Illness: Gabrielle Roy is a 28 y.o. Caucasian [id number] being evaluated for postpartum followup.    She is s/p repeat cesarean section on 07/08/18 at 38 weeks; she was discharged to home on POD#2. Pregnancy complicated by previous CS, gastric bypass, anxiety and depression, renal stones. Baby is doing well.  Complains of sadness, fatigue, and agitation with her kids recently. She is sleeping some but feeding baby every 2 hrs. She has hx of PPD and feels sx are recurring. Denies SI/HI, no thoughts of harming baby.   She was recently admitted for pyelonephritis, had stent removed yesterday, continues to have pain.   Vaginal bleeding or discharge: No  Intercourse: No  Contraception: Nexplanon Mode of feeding infant: Breast and formula PP depression s/s: Yes .  Any bowel or bladder issues: No  Pap smear: no abnormalities (date: 12/2017)  Review of Systems: Positive for none. Her 12 point review of systems is negative or as noted in the History of Present Illness.  Patient Active Problem List   Diagnosis Date Noted  . Hydronephrosis, left 08/10/2018  . Acute pyelonephritis 08/09/2018  . History of cesarean section 07/08/2018  . Anemia in  pregnancy 07/08/2018  . Late Entry into BRX opt schedule to limit social distancing due to COVID-19 05/30/2018  . History of preterm delivery 05/25/2018  . Threatened premature labor in third trimester 05/04/2018  . Renal stones 04/25/2018  . Urinary urgency 04/25/2018  . Symphysis pubis disruption, initial encounter 03/30/2018  . Back pain in pregnancy 03/28/2018  . Common migraine with intractable migraine 03/10/2018  . History of gestational hypertension 12/29/2017  . 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 Previous cesarean section complicating pregnancy, antepartum condition or complication 12/29/2017  . Postoperative intestinal malabsorption 06/07/2017  . 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 Former smoker 05/06/2017  . Obesity in pregnancy 11/06/2015  . Supervision of normal pregnancy 10/04/2015  . Anxiety state 09/29/2013  . Depressive disorder, not elsewhere classified 09/29/2013  . Gastroesophageal reflux disease without esophagitis 08/16/2013    Medications Isola A. Floresca had no medications administered during this visit. Current Outpatient Medications  Medication Sig Dispense Refill  . acetaminophen (TYLENOL) 325 MG tablet Take 650 mg by mouth every 6 (six) hours as needed for mild pain or headache.    . ciprofloxacin (CIPRO) 500 MG tablet Take 1 tablet (500 mg total) by mouth 2 (two) times daily for 14 days. 28 tablet 0  . ibuprofen (ADVIL) 800 MG tablet Take 800 mg by mouth every 8 (eight) hours as needed for fever, headache or moderate pain.     . tamsulosin (FLOMAX) 0.4 MG CAPS capsule Take 1 capsule (0.4 mg total) by mouth daily. 30 capsule 0  . traMADol (ULTRAM) 50 MG tablet Take 50 mg by mouth  2 (two) times daily as needed for severe pain.    . Catheters (SPECIMEN CATHETER FEMALE) MISC 1 Device by Does not apply route 4 (four) times daily. Pt is to use catheter QID for self cath (Patient not taking: Reported on 08/12/2018) 120 each 6  .  sertraline (ZOLOFT) 50 MG tablet Take 1 tablet (50 mg total) by mouth daily. 30 tablet 1   No current facility-administered medications for this visit.     Allergies Alcohol; Flagyl [metronidazole]; and Hydrocodone-acetaminophen  Physical Exam:  There were no vitals taken for this visit. General:  Alert, oriented and cooperative. Patient is in no acute distress.  Mental Status: Normal mood and affect. Normal behavior. Normal judgment and thought content.   Respiratory: Normal respiratory effort noted, no problems with respiration noted  Rest of physical exam deferred due to type of encounter  PP Depression Screening:   Edinburgh Postnatal Depression Scale - 08/12/18 0956      Edinburgh Postnatal Depression Scale:  In the Past 7 Days   I have been able to laugh and see the funny side of things.  2    I have looked forward with enjoyment to things.  1    I have blamed myself unnecessarily when things went wrong.  1    I have been anxious or worried for no good reason.  3    I have felt scared or panicky for no good reason.  2    Things have been getting on top of me.  2    I have been so unhappy that I have had difficulty sleeping.  0    I have felt sad or miserable.  2    I have been so unhappy that I have been crying.  1    The thought of harming myself has occurred to me.  0    Edinburgh Postnatal Depression Scale Total  14       Assessment: Post-op Cesarean section Postpartum depression  Contraceptive counseling  Plan: Restart Zoloft, will refer for counseling as well, discussed warning signs-call for emergencies Nexplanon- will schedule placement, abstain from IC or use condoms   I discussed the assessment and treatment plan with the patient. The patient was provided an opportunity to ask questions and all were answered. The patient agreed with the plan and demonstrated an understanding of the instructions.   The patient was advised to call back or seek an in-person  evaluation/go to the ED for any concerning postpartum symptoms.  I provided 20 minutes of face-to-face time during this encounter.   Donette LarryMelanie Abhiraj Dozal, CNM [facility], [facility]

## 2018-08-14 LAB — CULTURE, BLOOD (ROUTINE X 2)
Culture: NO GROWTH
Culture: NO GROWTH
Special Requests: ADEQUATE
Special Requests: ADEQUATE

## 2018-08-26 ENCOUNTER — Ambulatory Visit: Admitting: Certified Nurse Midwife

## 2018-08-26 ENCOUNTER — Other Ambulatory Visit: Payer: Self-pay | Admitting: Certified Nurse Midwife

## 2018-08-26 MED ORDER — NORELGESTROMIN-ETH ESTRADIOL 150-35 MCG/24HR TD PTWK: 1.0000 | MEDICATED_PATCH | TRANSDERMAL | 12 refills | Status: DC

## 2018-08-26 NOTE — Progress Notes (Signed)
Pt called, decided against Nexplanon, wants BC patch. Rx sent.

## 2018-10-19 ENCOUNTER — Other Ambulatory Visit: Payer: Self-pay | Admitting: Certified Nurse Midwife

## 2018-10-19 ENCOUNTER — Telehealth: Payer: Self-pay

## 2018-10-19 DIAGNOSIS — F419 Anxiety disorder, unspecified: Secondary | ICD-10-CM

## 2018-10-19 DIAGNOSIS — F329 Major depressive disorder, single episode, unspecified: Secondary | ICD-10-CM

## 2018-10-19 DIAGNOSIS — F32A Depression, unspecified: Secondary | ICD-10-CM

## 2018-10-19 MED ORDER — SERTRALINE HCL 50 MG PO TABS: 50.0000 mg | ORAL_TABLET | Freq: Every day | ORAL | 0 refills | Status: DC

## 2018-10-19 NOTE — Telephone Encounter (Addendum)
I left a VM on pt's phone letting her know we will send one refill of Zoloft but, that her PCP will need to manage this from here on out.   ----- Message from Julianne Handler, CNM sent at 10/19/2018 11:06 AM EDT ----- Regarding: RE: Zoloft Refill Happy to give her a refill, but would prefer PCP to manage after that. Thanks! ----- Message ----- From: Lyndal Rainbow, CMA Sent: 10/19/2018  10:53 AM EDT To: Julianne Handler, CNM Subject: Zoloft Refill                                  Hey Threasa Beards,  Ellarose called wanting a refill of her Zoloft. You prescribed it and gave her one refill back in June. You also recommended she see a counselor which she says she hasn't done because it is hard for her to find childcare. Did you want to continue refilling this or would you rather this be managed by her PCP? If you would like this managed by her PCP, she is asking if you could just give her one refill while she finds a PCP to be set up with. Just let me know what you'd like to do and I will let Amera know.  Thank you!

## 2018-12-01 ENCOUNTER — Encounter: Payer: Self-pay | Admitting: Certified Nurse Midwife

## 2018-12-01 ENCOUNTER — Ambulatory Visit (INDEPENDENT_AMBULATORY_CARE_PROVIDER_SITE_OTHER): Admitting: Certified Nurse Midwife

## 2018-12-01 ENCOUNTER — Other Ambulatory Visit: Payer: Self-pay

## 2018-12-01 VITALS — BP 116/68 | HR 53 | Ht 60.0 in | Wt 173.0 lb

## 2018-12-01 DIAGNOSIS — Z30017 Encounter for initial prescription of implantable subdermal contraceptive: Secondary | ICD-10-CM | POA: Diagnosis not present

## 2018-12-01 DIAGNOSIS — Z3202 Encounter for pregnancy test, result negative: Secondary | ICD-10-CM

## 2018-12-01 LAB — POCT URINE PREGNANCY: Preg Test, Ur: NEGATIVE

## 2018-12-01 MED ORDER — ETONOGESTREL 68 MG ~~LOC~~ IMPL
68.0000 mg | DRUG_IMPLANT | Freq: Once | SUBCUTANEOUS | Status: AC
  Administered 2018-12-01: 10:00:00 68 mg via SUBCUTANEOUS

## 2018-12-01 NOTE — Patient Instructions (Signed)
Nexplanon Instructions After Insertion  Keep bandage clean and dry for 24 hours  May use ice/Tylenol/Ibuprofen for soreness or pain  If you develop fever, drainage or increased warmth from incision site-contact office immediately   

## 2018-12-01 NOTE — Progress Notes (Signed)
GYNECOLOGY CLINIC PROCEDURE NOTE  Ms. Gabrielle Roy is a 28 y.o. [phone] here for  Nexplanon insertion. No GYN concerns.  Last pap smear was on 12/2017 and was normal.  No other gynecologic concerns.  Nexplanon Insertion Procedure Patient was given informed consent, she signed consent form.  Patient does understand that irregular bleeding is a very common side effect of this medication. She was advised to have backup contraception for one week after placement. Pregnancy test in clinic today was negative.  Appropriate time out taken.  Patient's left arm was prepped and draped in the usual sterile fashion.. The ruler used to measure and mark insertion area.  Patient was prepped with alcohol swab and then injected with 3 ml of 1% lidocaine.  She was prepped with betadine, Nexplanon removed from packaging,  Device confirmed in needle, then inserted full length of needle and withdrawn per handbook instructions. Nexplanon was able to palpated in the patient's arm; patient palpated the insert herself. There was minimal blood loss.  Patient insertion site covered with guaze and a pressure bandage to reduce any bruising.  The patient tolerated the procedure well and was given post procedure instructions.    Lajean Manes, CNM 12/01/2018 10:30 AM

## 2018-12-08 ENCOUNTER — Telehealth: Payer: Self-pay | Admitting: *Deleted

## 2018-12-08 ENCOUNTER — Ambulatory Visit: Payer: Self-pay | Admitting: Neurology

## 2018-12-08 ENCOUNTER — Encounter: Payer: Self-pay | Admitting: Neurology

## 2018-12-08 NOTE — Progress Notes (Deleted)
PATIENT: Gabrielle FriendsMelissa A Roy DOB: 05/17/1990  REASON FOR VISIT: follow up HISTORY FROM: patient  HISTORY OF PRESENT ILLNESS: Today 12/08/18  HISTORY June 08, 2018 SS: Gabrielle Roy is a 28 year old female with history of migraine, is currently pregnant due date is Jul 22, 2018. She was started on nortriptyline at her last visit increasing to 30 mg at bedtime, phenergan as needed.  She reports that her headaches have improved.  She is only having about 1 headache per month.  She is taking nortriptyline 10 mg at bedtime.  She has not had to take the Phenergan. She reports she is tolerating the medication well, however it does make her drowsy, sleep well at night.  She is concerned that when she has the baby she may need to switch to something else because the nortriptyline makes her sleepy.  She does report she had had a urinary stent placed.  Overall her headaches are doing much better.  She is planning to breast-feed.  03/10/2018 Dr. Anne HahnWillis: Gabrielle Roy is a 28 year old right-handed white female with a history of current pregnancy, due date is on 22 Jul 2018. The patient has had headaches since she was a teenager, her usual headache frequency is 3-4 headaches a week, but since her pregnancy the headaches have been daily in nature. The headaches are mainly in the bifrontal regions, and may be associated with blurred vision and some nausea and occasional vomiting. The patient is having very frequent periods of incapacity because of the headache currently, she is having a lot of difficulty being able to care for her 2other children. The patient does not note any particular activating factors for headache. She denies any family history of headache. She reports no numbness or weakness of the face, arms, legs. She denies any neck stiffness or sinus drainage. She currently is taking Fioricet for her headache if needed, she may occasionally take Tylenol.She drinks on average one caffeinated soda daily, no  other caffeine intake.  REVIEW OF SYSTEMS: Out of a complete 14 system review of symptoms, the patient complains only of the following symptoms, and all other reviewed systems are negative.  ALLERGIES: Allergies  Allergen Reactions  . Alcohol Hives    Allergic to alcohol containing products including cough medicines that contain alcohol  . Flagyl [Metronidazole] Other (See Comments)    Facial flushing  . Hydrocodone-Acetaminophen Nausea And Vomiting    HOME MEDICATIONS: Outpatient Medications Prior to Visit  Medication Sig Dispense Refill  . acetaminophen (TYLENOL) 325 MG tablet Take 650 mg by mouth every 6 (six) hours as needed for mild pain or headache.    . Catheters (SPECIMEN CATHETER FEMALE) MISC 1 Device by Does not apply route 4 (four) times daily. Pt is to use catheter QID for self cath (Patient not taking: Reported on 08/12/2018) 120 each 6  . ibuprofen (ADVIL) 800 MG tablet Take 800 mg by mouth every 8 (eight) hours as needed for fever, headache or moderate pain.     Marland Kitchen. norelgestromin-ethinyl estradiol (ORTHO EVRA) 150-35 MCG/24HR transdermal patch Place 1 patch onto the skin once a week. (Patient not taking: Reported on 12/01/2018) 3 patch 12  . sertraline (ZOLOFT) 50 MG tablet Take 1 tablet (50 mg total) by mouth daily. (Patient not taking: Reported on 12/01/2018) 30 tablet 0  . tamsulosin (FLOMAX) 0.4 MG CAPS capsule Take 1 capsule (0.4 mg total) by mouth daily. (Patient not taking: Reported on 12/01/2018) 30 capsule 0  . traMADol (ULTRAM) 50 MG tablet Take  50 mg by mouth 2 (two) times daily as needed for severe pain.     No facility-administered medications prior to visit.     PAST MEDICAL HISTORY: Past Medical History:  Diagnosis Date  . Anxiety   . Common migraine with intractable migraine 03/10/2018  . Depression   . Epigastric hernia   . History of kidney stones   . History of morbid obesity   . Hx of pre-eclampsia in prior pregnancy, currently pregnant, first trimester    . Hydronephrosis, left    Bilateral  . Kidney stone    Stent placed in Feb 2017  . PONV (postoperative nausea and vomiting)    nausea during last c section  . Supervision of normal pregnancy, antepartum 10/04/2015    Clinic  [place] - transfer from Dr. Micah Noel Prenatal Labs Dating  Blood type:    Genetic Screen 1 Screen:    AFP:     Quad:     NIPS: Antibody:  Anatomic [place]  Nml fetal anatomy @ 18 wks; low lying placenta > resolved @23  wks Rubella:   GTT Early:               Third trimester:  RPR:    Flu vaccine  Declined HBsAg:    TDaP vaccine                                               Rhogam: HIV:    Baby Food  Breastfeed                                            GBS: (For PCN allergy, check sensitivities) Contraception  Pap: Circumcision  n/a female ([facility] ultrasound)  Pediatrician   Support Person  Jeneen Rinks (husband)      PAST SURGICAL HISTORY: Past Surgical History:  Procedure Laterality Date  . CESAREAN SECTION    . CESAREAN SECTION N/A 02/20/2016   Procedure: REPEAT CESAREAN SECTION;  Surgeon: Chancy Milroy, MD;  Location: [facility];  Service: Obstetrics;  Laterality: N/A;  . CESAREAN SECTION N/A 07/08/2018   Procedure: CESAREAN SECTION;  Surgeon: Emily Filbert, MD;  Location: [facility];  Service: Obstetrics;  Laterality: N/A;  . CYSTOSCOPY W/ URETERAL STENT PLACEMENT Left 04/26/2018   Procedure: CYSTOSCOPY WITH RETROGRADE PYELOGRAM/URETERAL STENT PLACEMENT;  Surgeon: Ardis Hughs, MD;  Location: [facility];  Service: Urology;  Laterality: Left;  . CYSTOSCOPY WITH STENT PLACEMENT    . FINGER SURGERY    . GASTRIC BYPASS      FAMILY HISTORY: Family History  Problem Relation Age of Onset  . Hypertension Father   . Heart disease Father   . Hearing loss Father     SOCIAL HISTORY: Social History   Socioeconomic History  . Marital status: Married    Spouse name: Not on file  . Number of children: Not on file  . Years of education: Not on file  . Highest  education level: Not on file  Occupational History  . Occupation: Not working   Social Needs  . Financial resource strain: Not hard at all  . Food insecurity    Worry: Never true    Inability: Never true  . Transportation needs    Medical: No  Non-medical: Not on file  Tobacco Use  . Smoking status: Former Games developer  . Smokeless tobacco: Never Used  Substance and Sexual Activity  . Alcohol use: No  . Drug use: No  . Sexual activity: Yes    Birth control/protection: None  Lifestyle  . Physical activity    Days per week: Not on file    Minutes per session: Not on file  . Stress: Only a little  Relationships  . Social Musician on phone: Not on file    Gets together: Not on file    Attends religious service: Not on file    Active member of club or organization: Not on file    Attends meetings of clubs or organizations: Not on file    Relationship status: Not on file  . Intimate partner violence    Fear of current or ex partner: No    Emotionally abused: No    Physically abused: No    Forced sexual activity: No  Other Topics Concern  . Not on file  Social History Narrative   Right handed    Lives at home with children and spouse   Caffeine 2 cups per day           PHYSICAL EXAM  There were no vitals filed for this visit. There is no height or weight on file to calculate BMI.  Generalized: Well developed, in no acute distress   Neurological examination  Mentation: Alert oriented to time, place, history taking. Follows all commands speech and language fluent Cranial nerve II-XII: Pupils were equal round reactive to light. Extraocular movements were full, visual field were full on confrontational test. Facial sensation and strength were normal. Uvula tongue midline. Head turning and shoulder shrug  were normal and symmetric. Motor: The motor testing reveals 5 over 5 strength of all 4 extremities. Good symmetric motor tone is noted throughout.  Sensory:  Sensory testing is intact to soft touch on all 4 extremities. No evidence of extinction is noted.  Coordination: Cerebellar testing reveals good finger-nose-finger and heel-to-shin bilaterally.  Gait and station: Gait is normal. Tandem gait is normal. Romberg is negative. No drift is seen.  Reflexes: Deep tendon reflexes are symmetric and normal bilaterally.   DIAGNOSTIC DATA (LABS, IMAGING, TESTING) - I reviewed patient records, labs, notes, testing and imaging myself where available.  Lab Results  Component Value Date   WBC 8.2 08/10/2018   HGB 10.1 (L) 08/10/2018   HCT 33.0 (L) 08/10/2018   MCV 102.8 (H) 08/10/2018   PLT 153 08/10/2018      Component Value Date/Time   NA 138 08/10/2018 0455   K 3.5 08/10/2018 0455   CL 110 08/10/2018 0455   CO2 23 08/10/2018 0455   GLUCOSE 111 (H) 08/10/2018 0455   GLUCOSE 88 12/10/2015 0826   BUN 14 08/10/2018 0455   CREATININE 0.57 08/10/2018 0455   CREATININE 0.61 06/27/2018 1357   CALCIUM 8.2 (L) 08/10/2018 0455   PROT 5.5 (L) 06/27/2018 1357   ALBUMIN 3.2 (L) 01/29/2018 1925   AST 14 06/27/2018 1357   ALT 9 06/27/2018 1357   ALKPHOS 51 01/29/2018 1925   BILITOT 0.7 06/27/2018 1357   GFRNONAA >60 08/10/2018 0455   GFRAA >60 08/10/2018 0455   No results found for: CHOL, HDL, LDLCALC, LDLDIRECT, TRIG, CHOLHDL Lab Results  Component Value Date   HGBA1C 4.9 12/29/2017   Lab Results  Component Value Date   VITAMINB12 160 (L) 06/27/2018   Lab  Results  Component Value Date   TSH 1.553 01/29/2018      ASSESSMENT AND PLAN 28 y.o. year old female  has a past medical history of Anxiety, Common migraine with intractable migraine (03/10/2018), Depression, Epigastric hernia, History of kidney stones, History of morbid obesity, pre-eclampsia in prior pregnancy, currently pregnant, first trimester, Hydronephrosis, left, Kidney stone, PONV (postoperative nausea and vomiting), and Supervision of normal pregnancy, antepartum (10/04/2015). here  with ***   I spent 15 minutes with the patient. 50% of this time was spent   Margie Ege, [place], DNP 12/08/2018, 7:37 AM [facility] [address] [place], [place] [address] [phone]

## 2018-12-08 NOTE — Telephone Encounter (Signed)
Patient was no show for follow up with NP today.  

## 2019-01-05 ENCOUNTER — Institutional Professional Consult (permissible substitution): Payer: Self-pay | Admitting: Plastic Surgery

## 2019-01-12 ENCOUNTER — Institutional Professional Consult (permissible substitution): Payer: Self-pay | Admitting: Plastic Surgery

## 2019-07-25 ENCOUNTER — Encounter: Payer: Self-pay | Admitting: Gastroenterology

## 2019-08-31 ENCOUNTER — Ambulatory Visit: Admitting: Gastroenterology

## 2019-10-23 ENCOUNTER — Emergency Department (HOSPITAL_BASED_OUTPATIENT_CLINIC_OR_DEPARTMENT_OTHER)
Admission: EM | Admit: 2019-10-23 | Discharge: 2019-10-23 | Disposition: A | Discharge status: Home / Self Care | Attending: Emergency Medicine | Admitting: Emergency Medicine

## 2019-10-23 ENCOUNTER — Other Ambulatory Visit: Payer: Self-pay

## 2019-10-23 ENCOUNTER — Emergency Department (HOSPITAL_BASED_OUTPATIENT_CLINIC_OR_DEPARTMENT_OTHER)

## 2019-10-23 ENCOUNTER — Encounter (HOSPITAL_BASED_OUTPATIENT_CLINIC_OR_DEPARTMENT_OTHER): Payer: Self-pay | Admitting: *Deleted

## 2019-10-23 DIAGNOSIS — Z87891 Personal history of nicotine dependence: Secondary | ICD-10-CM | POA: Insufficient documentation

## 2019-10-23 DIAGNOSIS — R6883 Chills (without fever): Secondary | ICD-10-CM | POA: Diagnosis not present

## 2019-10-23 DIAGNOSIS — R1013 Epigastric pain: Secondary | ICD-10-CM | POA: Diagnosis present

## 2019-10-23 DIAGNOSIS — K436 Other and unspecified ventral hernia with obstruction, without gangrene: Secondary | ICD-10-CM | POA: Diagnosis not present

## 2019-10-23 DIAGNOSIS — K219 Gastro-esophageal reflux disease without esophagitis: Secondary | ICD-10-CM | POA: Diagnosis not present

## 2019-10-23 LAB — CBC
HCT: 38.7 % (ref 36.0–46.0)
Hemoglobin: 12.6 g/dL (ref 12.0–15.0)
MCH: 30.7 pg (ref 26.0–34.0)
MCHC: 32.6 g/dL (ref 30.0–36.0)
MCV: 94.2 fL (ref 80.0–100.0)
Platelets: 390 10*3/uL (ref 150–400)
RBC: 4.11 MIL/uL (ref 3.87–5.11)
RDW: 13.7 % (ref 11.5–15.5)
WBC: 12.9 10*3/uL — ABNORMAL HIGH (ref 4.0–10.5)
nRBC: 0 % (ref 0.0–0.2)

## 2019-10-23 LAB — COMPREHENSIVE METABOLIC PANEL
ALT: 15 U/L (ref 0–44)
AST: 17 U/L (ref 15–41)
Albumin: 4.2 g/dL (ref 3.5–5.0)
Alkaline Phosphatase: 112 U/L (ref 38–126)
Anion gap: 13 (ref 5–15)
BUN: 15 mg/dL (ref 6–20)
CO2: 19 mmol/L — ABNORMAL LOW (ref 22–32)
Calcium: 9 mg/dL (ref 8.9–10.3)
Chloride: 104 mmol/L (ref 98–111)
Creatinine, Ser: 0.87 mg/dL (ref 0.44–1.00)
GFR calc Af Amer: >60 mL/min (ref >60)
GFR calc non Af Amer: >60 mL/min (ref >60)
Glucose, Bld: 146 mg/dL — ABNORMAL HIGH (ref 70–99)
Potassium: 4.1 mmol/L (ref 3.5–5.1)
Sodium: 136 mmol/L (ref 135–145)
Total Bilirubin: 1.1 mg/dL (ref 0.3–1.2)
Total Protein: 7.7 g/dL (ref 6.5–8.1)

## 2019-10-23 LAB — URINALYSIS, ROUTINE W REFLEX MICROSCOPIC
Bilirubin Urine: NEGATIVE
Glucose, UA: NEGATIVE mg/dL
Hgb urine dipstick: NEGATIVE
Ketones, ur: NEGATIVE mg/dL
Leukocytes,Ua: NEGATIVE
Nitrite: NEGATIVE
Protein, ur: NEGATIVE mg/dL
Specific Gravity, Urine: 1.025 (ref 1.005–1.030)
pH: 6 (ref 5.0–8.0)

## 2019-10-23 LAB — PREGNANCY, URINE: Preg Test, Ur: NEGATIVE

## 2019-10-23 LAB — LIPASE, BLOOD: Lipase: 25 U/L (ref 11–51)

## 2019-10-23 MED ORDER — PROMETHAZINE HCL 25 MG/ML IJ SOLN
INTRAMUSCULAR | Status: AC
  Administered 2019-10-23: 6.25 mg
  Filled 2019-10-23: qty 1

## 2019-10-23 MED ORDER — HYDROMORPHONE HCL 1 MG/ML IJ SOLN
0.5000 mg | Freq: Once | INTRAMUSCULAR | Status: AC
  Administered 2019-10-23: 0.5 mg via INTRAVENOUS
  Filled 2019-10-23: qty 1

## 2019-10-23 MED ORDER — MORPHINE SULFATE (PF) 4 MG/ML IV SOLN
4.0000 mg | Freq: Once | INTRAVENOUS | Status: AC
  Administered 2019-10-23: 4 mg via INTRAVENOUS
  Filled 2019-10-23: qty 1

## 2019-10-23 MED ORDER — HYDROMORPHONE HCL 1 MG/ML IJ SOLN
1.0000 mg | Freq: Once | INTRAMUSCULAR | Status: AC
  Administered 2019-10-23: 1 mg via INTRAVENOUS
  Filled 2019-10-23: qty 1

## 2019-10-23 MED ORDER — IOHEXOL 300 MG/ML  SOLN
100.0000 mL | Freq: Once | INTRAMUSCULAR | Status: AC
  Administered 2019-10-23: 100 mL via INTRAVENOUS

## 2019-10-23 MED ORDER — ONDANSETRON HCL 4 MG/2ML IJ SOLN
4.0000 mg | Freq: Once | INTRAMUSCULAR | Status: AC
  Administered 2019-10-23: 4 mg via INTRAVENOUS
  Filled 2019-10-23: qty 2

## 2019-10-23 MED ORDER — PROMETHAZINE HCL 25 MG/ML IJ SOLN: 6.2500 mg | Freq: Once | INTRAMUSCULAR | Status: AC

## 2019-10-23 NOTE — Progress Notes (Signed)
CT pending preg test

## 2019-10-23 NOTE — ED Triage Notes (Addendum)
Pt c/o abd pain and nausea  x 1 hr ago 6/21 Gallbladder removal

## 2019-10-23 NOTE — ED Notes (Signed)
To clarify, the bulging area above umbilicus was noted prior to the pain, nausea and vomiting

## 2019-10-23 NOTE — ED Provider Notes (Signed)
[facility] EMERGENCY DEPARTMENT Provider Note   CSN: [id number] Arrival date & time: 10/23/19  1712     History Chief Complaint  Patient presents with  . Abdominal Pain    Gabrielle Roy is a 29 y.o. female with history of migraine headaches, nephrolithiasis, obesity, GERD presents for evaluation of acute onset, persistent severe abdominal pain that occurred approximately 3 hours ago.  She reports that she developed sudden onset severe sharp pains to the epigastric region.  She states that she was able to feel a palpable mass just superior to her umbilicus.  She has been nauseated and subsequently developed dry heaves and has had a few episodes of nonbloody emesis while in the waiting room.  Has not had any bowel movements today which she states is a little atypical for her as she typically has 4-5 loose bowel movements every time she eats.  She has not had much to eat today.  Denies fevers but has had some chills.  Denies urinary symptoms, vaginal itching, bleeding, discharge, chest pain or shortness of breath.  Has not tried anything for her symptoms.  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  The history is provided by the patient.       Past Medical History:  Diagnosis Date  . Anxiety   . Common migraine with intractable migraine 03/10/2018  . Depression   . Epigastric hernia   . History of kidney stones   . History of morbid obesity   . Hx of pre-eclampsia in prior pregnancy, currently pregnant, first trimester   . Hydronephrosis, left    Bilateral  . Kidney stone    Stent placed in Feb 2017  . PONV (postoperative nausea and vomiting)    nausea during last c section  . Supervision of normal pregnancy, antepartum 10/04/2015    Clinic  [place] - transfer from Dr. Shawnie Ponsorn Prenatal Labs Dating  Blood type:    Genetic Screen 1 Screen:    AFP:     Quad:     NIPS: Antibody:  Anatomic US   Nml fetal anatomy @ 18 wks; low lying placenta > resolved @23  wks Rubella:   GTT Early:               Third trimester:  RPR:    Flu vaccine  Declined HBsAg:    TDaP vaccine                                               Rhogam: HIV:    Baby Food  Breastfeed                                            GBS: (For PCN allergy, check sensitivities) Contraception  Pap: Circumcision  n/a female ([facility] ultrasound)  Pediatrician   Support Person  [name] (husband)      Patient Active Problem List   Diagnosis Date Noted  . Hydronephrosis, left 08/10/2018  . Acute pyelonephritis 08/09/2018  . Anemia in pregnancy 07/08/2018  . Renal stones 04/25/2018  . Common migraine with intractable migraine 03/10/2018  . 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 Postoperative intestinal malabsorption 06/07/2017  . S/P laparoscopic  sleeve gastrectomy 06/07/2017  . Former smoker 05/06/2017  . Anxiety state 09/29/2013  . Depressive disorder, not elsewhere classified 09/29/2013  . Gastroesophageal reflux disease without esophagitis 08/16/2013    Past Surgical History:  Procedure Laterality Date  . CESAREAN SECTION    . CESAREAN SECTION N/A 02/20/2016   Procedure: REPEAT CESAREAN SECTION;  Surgeon: Hermina Staggers, MD;  Location: [facility] BIRTHING SUITES;  Service: Obstetrics;  Laterality: N/A;  . CESAREAN SECTION N/A 07/08/2018   Procedure: CESAREAN SECTION;  Surgeon: Allie Bossier, MD;  Location: [facility] LD ORS;  Service: Obstetrics;  Laterality: N/A;  . CYSTOSCOPY W/ URETERAL STENT PLACEMENT Left 04/26/2018   Procedure: CYSTOSCOPY WITH RETROGRADE PYELOGRAM/URETERAL STENT PLACEMENT;  Surgeon: Crist Fat, MD;  Location: [facility] ORS;  Service: Urology;  Laterality: Left;  . CYSTOSCOPY WITH STENT PLACEMENT    . FINGER SURGERY    . GASTRIC BYPASS       OB History    Gravida  3   Para  3   Term  2   Preterm  1   AB      Living  3     SAB      TAB      Ectopic      Multiple  0   Live Births  3             Family History  Problem Relation Age of Onset  . Hypertension Father   . Heart disease Father   . Hearing loss Father     Social History   Tobacco Use  . Smoking status: Former Games developer  . Smokeless tobacco: Never Used  Vaping Use  . Vaping Use: Never used  Substance Use Topics  . Alcohol use: No  . Drug use: No    Home Medications Prior to Admission medications   Medication Sig Start Date End Date Taking? Authorizing Provider  acetaminophen (TYLENOL) 325 MG tablet Take 650 mg by mouth every 6 (six) hours as needed for mild pain or headache.    [provider]  Catheters (SPECIMEN CATHETER FEMALE) MISC 1 Device by Does not apply route 4 (four) times daily. Pt is to use catheter QID for self cath Patient not taking: Reported on 08/12/2018 06/13/18   Lesly Dukes, MD  ibuprofen (ADVIL) 800 MG tablet Take 800 mg by mouth every 8 (eight) hours as needed for fever, headache or moderate pain.     [provider]  norelgestromin-ethinyl estradiol (ORTHO EVRA) 150-35 MCG/24HR transdermal patch Place 1 patch onto the skin once a week. Patient not taking: Reported on 12/01/2018 08/26/18   Donette Larry, CNM  sertraline (ZOLOFT) 50 MG tablet Take 1 tablet (50 mg total) by mouth daily. Patient not taking: Reported on 12/01/2018 10/19/18   Donette Larry, CNM  tamsulosin (FLOMAX) 0.4 MG CAPS capsule Take 1 capsule (0.4 mg total) by mouth daily. Patient not taking: Reported on 12/01/2018 08/11/18   Noralee Stain, DO  traMADol (ULTRAM) 50 MG tablet Take 50 mg by mouth 2 (two) times daily as needed for severe pain.    [provider]    Allergies    Alcohol, Flagyl [metronidazole], and Hydrocodone-acetaminophen  Review of Systems   Review of Systems  Constitutional: Positive for chills. Negative for fever.  Respiratory: Negative for shortness of breath.   Cardiovascular: Negative for chest pain.  Gastrointestinal: Positive for abdominal pain, nausea and  vomiting. Negative for diarrhea.  Genitourinary: Negative for dysuria, frequency, vaginal bleeding, vaginal discharge  and vaginal pain.  All other systems reviewed and are negative.   Physical Exam Updated Vital Signs BP (!) 95/53 (BP Location: Right Arm)   Pulse 65   Temp 98.4 F (36.9 C) (Oral)   Resp 16   Ht  (1.549 m)   Wt 77.1 kg   LMP 09/26/2019   SpO2 100%   BMI 32.12 kg/m   Physical Exam Vitals and nursing note reviewed.  Constitutional:      General: She is in acute distress.     Appearance: She is well-developed. She is obese.     Comments: Appears uncomfortable, occasionally crying out in pain  HENT:     Head: Normocephalic and atraumatic.  Eyes:     General:        Right eye: No discharge.        Left eye: No discharge.     Conjunctiva/sclera: Conjunctivae normal.  Neck:     Vascular: No JVD.     Trachea: No tracheal deviation.  Cardiovascular:     Rate and Rhythm: Normal rate and regular rhythm.  Pulmonary:     Effort: Pulmonary effort is normal.     Breath sounds: Normal breath sounds.  Abdominal:     General: Abdomen is protuberant. A surgical scar is present. Bowel sounds are decreased. There is no distension.     Palpations: Abdomen is soft. There is mass.     Tenderness: There is generalized abdominal tenderness. There is no rebound.     Comments: There is a palpable knot just superior to the umbilicus in the epigastric region just tender to palpation and does not reduce easily at this time  Skin:    General: Skin is warm and dry.     Findings: No erythema.  Neurological:     Mental Status: She is alert.  Psychiatric:        Behavior: Behavior normal.     ED Results / Procedures / Treatments   Labs (all labs ordered are listed, but only abnormal results are displayed) Labs Reviewed  CBC - Abnormal; Notable for the following components:      Result Value   WBC 12.9 (*)    All other components within normal limits  COMPREHENSIVE  METABOLIC PANEL - Abnormal; Notable for the following components:   CO2 19 (*)    Glucose, Bld 146 (*)    All other components within normal limits  URINALYSIS, ROUTINE W REFLEX MICROSCOPIC - Abnormal; Notable for the following components:   APPearance HAZY (*)    All other components within normal limits  PREGNANCY, URINE  LIPASE, BLOOD    EKG None  Radiology CT ABDOMEN PELVIS W CONTRAST  Result Date: 10/23/2019 CLINICAL DATA:  Abdominal pain and nausea. Hernia suspected. History of gastric bypass surgery, nephrolithiasis and cholecystectomy. EXAM: CT ABDOMEN AND PELVIS WITH CONTRAST TECHNIQUE: Multidetector CT imaging of the abdomen and pelvis was performed using the standard protocol following bolus administration of intravenous contrast. CONTRAST:  OMNIPAQUE IOHEXOL 300 MG/ML  SOLN COMPARISON:  08/09/2018 unenhanced CT abdomen/pelvis. FINDINGS: Lower chest: No significant pulmonary nodules or acute consolidative airspace disease. Hepatobiliary: Normal liver size. No liver mass. Cholecystectomy. Bile ducts are stable and within normal post cholecystectomy limits with CBD diameter 6 mm. Pancreas: Normal, with no mass or duct dilation. Spleen: Normal size. No mass. Adrenals/Urinary Tract: Normal adrenals. No hydronephrosis. No renal masses. Normal bladder. Stomach/Bowel: Status post Roux-en-Y gastric bypass surgery. Collapsed excluded distal stomach. Intact appearing gastrojejunostomy. There  is a moderate midline supraumbilical ventral abdominal hernia containing a loop of proximal jejunum with associated upstream small bowel dilatation up to 3.9 cm diameter with fluid levels in the dilated proximal small bowel loops. Collapsed mid to distal small bowel. No small bowel wall thickening or pneumatosis. Normal appendix. Normal large bowel with no diverticulosis, large bowel wall thickening or pericolonic fat stranding. Vascular/Lymphatic: Normal caliber abdominal aorta. Patent portal, splenic,  hepatic and renal veins. No pathologically enlarged lymph nodes in the abdomen or pelvis. Reproductive: Grossly normal uterus.  No adnexal mass. Other: No pneumoperitoneum, ascites or focal fluid collection. Musculoskeletal: No aggressive appearing focal osseous lesions. IMPRESSION: 1. Mechanical proximal small-bowel obstruction due to incarcerated jejunal loop within moderate size midline supraumbilical ventral abdominal hernia. No bowel wall thickening, pneumatosis or free air. 2. Status post Roux-en-Y gastric bypass surgery. Electronically Signed   By: Delbert Phenix M.D.   On: 10/23/2019 19:58    Procedures Procedures (including critical care time)  Medications Ordered in ED Medications  morphine 4 MG/ML injection 4 mg (4 mg Intravenous Given 10/23/19 1838)  ondansetron (ZOFRAN) injection 4 mg (4 mg Intravenous Given 10/23/19 1837)  iohexol (OMNIPAQUE) 300 MG/ML solution 100 mL (100 mLs Intravenous Contrast Given 10/23/19 1934)  promethazine (PHENERGAN) injection 6.25 mg (6.25 mg Intravenous Given 10/23/19 1930)  HYDROmorphone (DILAUDID) injection 1 mg (1 mg Intravenous Given 10/23/19 1926)  HYDROmorphone (DILAUDID) injection 0.5 mg (0.5 mg Intravenous Given 10/23/19 2208)  ondansetron (ZOFRAN) injection 4 mg (4 mg Intravenous Given 10/23/19 2204)    ED Course  I have reviewed the triage vital signs and the nursing notes.  Pertinent labs & imaging results that were available during my care of the patient were reviewed by me and considered in my medical decision making (see chart for details).  Clinical Course as of Oct 23 1529  Mon Oct 23, 2019  [id number] 29 year old female with history of cholecystectomy and gastric bypass here with acute onset of abdominal pain nausea vomiting.  CT done and appears to have a umbilical hernia.  On exam she has a tender proximally 4 cm mass there with no overlying erythema.  With reasonable pain control she was manually reduced.  Will reassess   [MB]    Clinical  Course User Index [MB] Terrilee Files, MD   MDM Rules/Calculators/A&P                          Patient presenting for evaluation of sudden onset epigastric/periumbilical abdominal pain with associated nausea and vomiting.  She is afebrile, mildly tachypneic on arrival but appears quite uncomfortable.  There is a palpable mass consistent with hernia superior to the umbilicus.  Concern for possible incarcerated hernia.  Will give pain medicine, nausea medicine and obtain lab work and imaging for further evaluation.  Lab work reviewed and interpreted by myself shows mild leukocytosis, no anemia, no metabolic derangements, no renal insufficiency.  LFTs are within normal limits.  Lipase is within normal limits.  UA is not concerning for UTI or nephrolithiasis.  Imaging reviewed and interpreted by myself shows incarcerated jejunal loop within midline supraumbilical ventral abdominal hernia resulting in proximal small bowel obstruction.  I agree with radiology's interpretation.   Dr. Charm Barges was able to reduce the hernia on his assessment.  On reassessment the patient is resting comfortably, reports that she is feeling much better.  We will plan to consult general surgery for further recommendations but likely will need p.o. challenge  and outpatient follow-up.  CONSULT: Spoke with Dr. Donell Beers with on-call general surgery, discussed patient's work-up and findings.  Relayed the patient's hernia has been reduced and that she symptomatically is improved.  Dr. Donell Beers agrees with p.o. challenge in the ED and if she passes is close outpatient follow-up.  At this time no evidence of acute surgical abdominal pathology.  Patient was able to tolerate ginger ale and teddy grams.  She is feeling much better.  She is ambulatory in the ED without recurrent obstruction of hernia.  We discussed avoiding heavy lifting which might be difficult for her as she has a 54-year-old and a 69-year-old at home.  We also discussed  reducing intra-abdominal pressure and protecting her core.  She has a Development worker, international aid through an outside hospital system as she has had a laparoscopic cholecystectomy earlier this year and a gastric bypass surgery a few years ago.  She will call her general surgeon but we will also give her the information for our on-call general surgery team.  Discussed pain management at home with Tylenol.  Discussed strict ED return precautions.  Patient and her mother-in-law at the bedside verbalized understanding of and agreement with plan and patient stable for discharge at this time.   Final Clinical Impression(s) / ED Diagnoses Final diagnoses:  Ventral hernia with obstruction and without gangrene    Rx / DC Orders ED Discharge Orders    None       Bennye Alm 10/24/19 1535    Terrilee Files, MD 10/25/19 1106

## 2019-10-23 NOTE — Discharge Instructions (Signed)
1. Medications: You can take ibuprofen with food or Tylenol as needed for pain.  Be careful with NSAIDs as you have had gastric bypass and this can cause GI upset.  Do not exceed more than 4000 mg of Tylenol daily.  You can take your nausea medicine at home as prescribed. 2. Treatment: rest, drink plenty of fluids, avoid heavy lifting.  Avoid activities that cause increased intra-abdominal pressure such as sneezing, coughing, straining to have a bowel movement.  If you do sneeze or cough, try to protect your abdomen by placing the palm of your hand against the area where the hernia is and applying gentle pressure.  If it feels like the hernia has a piece of bowel in it, try to gently push the bowel back in place by applying constant gentle pressure.  You can also apply ice for 20 minutes before trying to reduce the hernia. 3. Follow Up: Please call your general surgeon or our general surgeon on-call tomorrow morning to schedule close follow-up appointment to discuss possible surgical repair and for further management; Please return to the ER for persistent vomiting, high fevers, if you are unable to reduce the hernia, or worsening symptoms

## 2019-10-23 NOTE — ED Notes (Signed)
Pt on bedpan trying to pee

## 2019-10-23 NOTE — ED Notes (Signed)
Patient transported to CT 

## 2019-10-23 NOTE — ED Notes (Signed)
Safety measures in place, sr x 2 up, callbell within reach, pt instructed not to get up without staff at bedside, pt repositioned for comfort

## 2019-10-23 NOTE — ED Notes (Signed)
Presents with abd pain, onset a few hours ago, having nausea as well, has hx of Lap Chole and Gastric Bypass, abd is very tender above umbilicus

## 2019-10-23 NOTE — ED Notes (Signed)
Instructed pt to remain NPO until further notice, last meal intake was 1100hrs, last PO fluid intake was 1530hrs

## 2020-05-28 ENCOUNTER — Emergency Department (HOSPITAL_COMMUNITY)
Admission: EM | Admit: 2020-05-28 | Discharge: 2020-05-28 | Disposition: A | Discharge status: Home / Self Care | Payer: Self-pay | Attending: Emergency Medicine | Admitting: Emergency Medicine

## 2020-05-28 ENCOUNTER — Other Ambulatory Visit: Payer: Self-pay

## 2020-05-28 ENCOUNTER — Emergency Department (HOSPITAL_COMMUNITY): Payer: Self-pay

## 2020-05-28 DIAGNOSIS — Z87891 Personal history of nicotine dependence: Secondary | ICD-10-CM | POA: Insufficient documentation

## 2020-05-28 DIAGNOSIS — U071 COVID-19: Secondary | ICD-10-CM | POA: Insufficient documentation

## 2020-05-28 LAB — COMPREHENSIVE METABOLIC PANEL
ALT: 13 U/L (ref 0–44)
AST: 22 U/L (ref 15–41)
Albumin: 3.4 g/dL — ABNORMAL LOW (ref 3.5–5.0)
Alkaline Phosphatase: 108 U/L (ref 38–126)
Anion gap: 5 (ref 5–15)
BUN: 8 mg/dL (ref 6–20)
CO2: 20 mmol/L — ABNORMAL LOW (ref 22–32)
Calcium: 8 mg/dL — ABNORMAL LOW (ref 8.9–10.3)
Chloride: 113 mmol/L — ABNORMAL HIGH (ref 98–111)
Creatinine, Ser: 0.85 mg/dL (ref 0.44–1.00)
GFR, Estimated: >60 mL/min (ref >60)
Glucose, Bld: 100 mg/dL — ABNORMAL HIGH (ref 70–99)
Potassium: 2.8 mmol/L — ABNORMAL LOW (ref 3.5–5.1)
Sodium: 138 mmol/L (ref 135–145)
Total Bilirubin: 1.4 mg/dL — ABNORMAL HIGH (ref 0.3–1.2)
Total Protein: 6.2 g/dL — ABNORMAL LOW (ref 6.5–8.1)

## 2020-05-28 LAB — URINALYSIS, ROUTINE W REFLEX MICROSCOPIC
Bilirubin Urine: NEGATIVE
Glucose, UA: NEGATIVE mg/dL
Hgb urine dipstick: NEGATIVE
Ketones, ur: 20 mg/dL — AB
Leukocytes,Ua: NEGATIVE
Nitrite: NEGATIVE
Protein, ur: NEGATIVE mg/dL
Specific Gravity, Urine: 1.03 (ref 1.005–1.030)
pH: 5 (ref 5.0–8.0)

## 2020-05-28 LAB — CBC WITH DIFFERENTIAL/PLATELET
Abs Immature Granulocytes: 0.05 10*3/uL (ref 0.00–0.07)
Basophils Absolute: 0 10*3/uL (ref 0.0–0.1)
Basophils Relative: 0 %
Eosinophils Absolute: 0 10*3/uL (ref 0.0–0.5)
Eosinophils Relative: 0 %
HCT: 31.9 % — ABNORMAL LOW (ref 36.0–46.0)
Hemoglobin: 10.4 g/dL — ABNORMAL LOW (ref 12.0–15.0)
Immature Granulocytes: 0 %
Lymphocytes Relative: 9 %
Lymphs Abs: 1.1 10*3/uL (ref 0.7–4.0)
MCH: 30.1 pg (ref 26.0–34.0)
MCHC: 32.6 g/dL (ref 30.0–36.0)
MCV: 92.2 fL (ref 80.0–100.0)
Monocytes Absolute: 0.7 10*3/uL (ref 0.1–1.0)
Monocytes Relative: 6 %
Neutro Abs: 10.5 10*3/uL — ABNORMAL HIGH (ref 1.7–7.7)
Neutrophils Relative %: 85 %
Platelets: 213 10*3/uL (ref 150–400)
RBC: 3.46 MIL/uL — ABNORMAL LOW (ref 3.87–5.11)
RDW: 13.4 % (ref 11.5–15.5)
WBC: 12.4 10*3/uL — ABNORMAL HIGH (ref 4.0–10.5)
nRBC: 0 % (ref 0.0–0.2)

## 2020-05-28 LAB — GROUP A STREP BY PCR: Group A Strep by PCR: NOT DETECTED

## 2020-05-28 LAB — MONONUCLEOSIS SCREEN: Mono Screen: NEGATIVE

## 2020-05-28 LAB — RESP PANEL BY RT-PCR (FLU A&B, COVID) ARPGX2
Influenza A by PCR: NEGATIVE
Influenza B by PCR: NEGATIVE
SARS Coronavirus 2 by RT PCR: POSITIVE — AB

## 2020-05-28 LAB — I-STAT BETA HCG BLOOD, ED (MC, WL, AP ONLY): I-stat hCG, quantitative: <5 m[IU]/mL (ref <5)

## 2020-05-28 LAB — PROTIME-INR
INR: 1.2 (ref 0.8–1.2)
Prothrombin Time: 14.8 seconds (ref 11.4–15.2)

## 2020-05-28 LAB — APTT: aPTT: 34 seconds (ref 24–36)

## 2020-05-28 LAB — LACTIC ACID, PLASMA: Lactic Acid, Venous: 1.3 mmol/L (ref 0.5–1.9)

## 2020-05-28 MED ORDER — IBUPROFEN 800 MG PO TABS
800.0000 mg | ORAL_TABLET | Freq: Once | ORAL | Status: AC
  Administered 2020-05-28: 800 mg via ORAL
  Filled 2020-05-28: qty 1

## 2020-05-28 MED ORDER — LACTATED RINGERS IV BOLUS
1000.0000 mL | Freq: Once | INTRAVENOUS | Status: AC
  Administered 2020-05-28: 1000 mL via INTRAVENOUS

## 2020-05-28 MED ORDER — POTASSIUM CHLORIDE CRYS ER 20 MEQ PO TBCR
40.0000 meq | EXTENDED_RELEASE_TABLET | Freq: Once | ORAL | Status: AC
  Administered 2020-05-28: 40 meq via ORAL
  Filled 2020-05-28: qty 2

## 2020-05-28 MED ORDER — ACETAMINOPHEN 325 MG PO TABS
650.0000 mg | ORAL_TABLET | Freq: Once | ORAL | Status: AC | PRN
  Administered 2020-05-28: 650 mg via ORAL
  Filled 2020-05-28: qty 2

## 2020-05-28 NOTE — ED Provider Notes (Signed)
[facility] EMERGENCY DEPARTMENT Provider Note  CSN: [id number] Arrival date & time: 05/28/20 1525    History Chief Complaint  Patient presents with  . Fever  . Covid Positive    HPI  Gabrielle Roy is a 30 y.o. female with history as below presents for evaluation of fever, onset yesterday associated with sore throat, myalgias, headache. She noted some white spots on her tonsils and a sore on her R posterior neck. She is very anxious because she has been taking care of her mother who is recovering from 'flesh eating bacteria' that required wound vacs. The vacs are off and she is back home but the patient goes to her house once a week to check on her.    Past Medical History:  Diagnosis Date  . Anxiety   . Common migraine with intractable migraine 03/10/2018  . Depression   . Epigastric hernia   . History of kidney stones   . History of morbid obesity   . Hx of pre-eclampsia in prior pregnancy, currently pregnant, first trimester   . Hydronephrosis, left    Bilateral  . Kidney stone    Stent placed in Feb 2017  . PONV (postoperative nausea and vomiting)    nausea during last c section  . Supervision of normal pregnancy, antepartum 10/04/2015    Clinic  [place] - transfer from Dr. Shawnie Pons Prenatal Labs Dating  Blood type:    Genetic Screen 1 Screen:    AFP:     Quad:     NIPS: Antibody:  Anatomic [place]  Nml fetal anatomy @ 18 wks; low lying placenta > resolved @23  wks Rubella:   GTT Early:               Third trimester:  RPR:    Flu vaccine  Declined HBsAg:    TDaP vaccine                                               Rhogam: HIV:    Baby Food  Breastfeed                                            GBS: (For PCN allergy, check sensitivities) Contraception  Pap: Circumcision  n/a female ([employer] [facility] ultrasound)  Pediatrician   Support Person  (husband)      Past Surgical History:  Procedure Laterality Date  . CESAREAN SECTION    . CESAREAN SECTION N/A 02/20/2016    Procedure: REPEAT CESAREAN SECTION;  Surgeon: 02/22/2016, MD;  Location: [facility] BIRTHING SUITES;  Service: Obstetrics;  Laterality: N/A;  . CESAREAN SECTION N/A 07/08/2018   Procedure: CESAREAN SECTION;  Surgeon: 09/07/2018, MD;  Location: [facility] LD ORS;  Service: Obstetrics;  Laterality: N/A;  . CYSTOSCOPY W/ URETERAL STENT PLACEMENT Left 04/26/2018   Procedure: CYSTOSCOPY WITH RETROGRADE PYELOGRAM/URETERAL STENT PLACEMENT;  Surgeon: 04/28/2018, MD;  Location: [facility] ORS;  Service: Urology;  Laterality: Left;  . CYSTOSCOPY WITH STENT PLACEMENT    . FINGER SURGERY    . GASTRIC BYPASS      Family History  Problem Relation Age of Onset  . Hypertension Father   . Heart disease Father   . Hearing loss Father  Social History   Tobacco Use  . Smoking status: Former Games developer  . Smokeless tobacco: Never Used  Vaping Use  . Vaping Use: Never used  Substance Use Topics  . Alcohol use: No  . Drug use: No     Home Medications Prior to Admission medications   Medication Sig Start Date End Date Taking? Authorizing Provider  acetaminophen (TYLENOL) 325 MG tablet Take 650 mg by mouth every 6 (six) hours as needed for mild pain or headache.    [provider]  Catheters (SPECIMEN CATHETER FEMALE) MISC 1 Device by Does not apply route 4 (four) times daily. Pt is to use catheter QID for self cath Patient not taking: Reported on 08/12/2018 06/13/18   Lesly Dukes, MD  ibuprofen (ADVIL) 800 MG tablet Take 800 mg by mouth every 8 (eight) hours as needed for fever, headache or moderate pain.     [provider]  norelgestromin-ethinyl estradiol (ORTHO EVRA) 150-35 MCG/24HR transdermal patch Place 1 patch onto the skin once a week. Patient not taking: Reported on 12/01/2018 08/26/18   Donette Larry, CNM  sertraline (ZOLOFT) 50 MG tablet Take 1 tablet (50 mg total) by mouth daily. Patient not taking: Reported on 12/01/2018 10/19/18   Donette Larry, CNM  tamsulosin (FLOMAX) 0.4  MG CAPS capsule Take 1 capsule (0.4 mg total) by mouth daily. Patient not taking: Reported on 12/01/2018 08/11/18   Noralee Stain, DO  traMADol (ULTRAM) 50 MG tablet Take 50 mg by mouth 2 (two) times daily as needed for severe pain.    [provider]     Allergies    Alcohol, Flagyl [metronidazole], and Hydrocodone-acetaminophen   Review of Systems   Review of Systems A comprehensive review of systems was completed and negative except as noted in HPI.    Physical Exam BP 124/76   Pulse 81   Temp 99 F (37.2 C)   Resp 16   Ht 5\' 1"  (1.549 m)   Wt 77.1 kg   SpO2 100%   BMI 32.12 kg/m   Physical Exam Vitals and nursing note reviewed.  Constitutional:      General: She is not in acute distress.    Appearance: Normal appearance. She is not ill-appearing.  HENT:     Head: Normocephalic and atraumatic.     Right Ear: Tympanic membrane normal.     Left Ear: Tympanic membrane normal.     Nose: Nose normal.     Mouth/Throat:     Mouth: Mucous membranes are dry.     Pharynx: Oropharyngeal exudate (L tonsil) and posterior oropharyngeal erythema present.  Eyes:     Extraocular Movements: Extraocular movements intact.     Conjunctiva/sclera: Conjunctivae normal.  Cardiovascular:     Rate and Rhythm: Normal rate.  Pulmonary:     Effort: Pulmonary effort is normal.     Breath sounds: Normal breath sounds.  Abdominal:     General: Abdomen is flat.     Palpations: Abdomen is soft.     Tenderness: There is no abdominal tenderness. There is no guarding.  Musculoskeletal:        General: No swelling. Normal range of motion.     Cervical back: Neck supple. No rigidity.  Lymphadenopathy:     Cervical: No cervical adenopathy.  Skin:    General: Skin is warm and dry.     Comments: There is a small 1cm area of excoriated skin on the R posterior neck without fluctuance, crepitus or signs of  surrounding cellulitis.   Neurological:     General: No focal deficit present.      Mental Status: She is alert.  Psychiatric:        Mood and Affect: Mood normal.      ED Results / Procedures / Treatments   Labs (all labs ordered are listed, but only abnormal results are displayed) Labs Reviewed  RESP PANEL BY RT-PCR (FLU A&B, COVID) ARPGX2 - Abnormal; Notable for the following components:      Result Value   SARS Coronavirus 2 by RT PCR POSITIVE (*)    All other components within normal limits  COMPREHENSIVE METABOLIC PANEL - Abnormal; Notable for the following components:   Potassium 2.8 (*)    Chloride 113 (*)    CO2 20 (*)    Glucose, Bld 100 (*)    Calcium 8.0 (*)    Total Protein 6.2 (*)    Albumin 3.4 (*)    Total Bilirubin 1.4 (*)    All other components within normal limits  CBC WITH DIFFERENTIAL/PLATELET - Abnormal; Notable for the following components:   WBC 12.4 (*)    RBC 3.46 (*)    Hemoglobin 10.4 (*)    HCT 31.9 (*)    Neutro Abs 10.5 (*)    All other components within normal limits  URINALYSIS, ROUTINE W REFLEX MICROSCOPIC - Abnormal; Notable for the following components:   APPearance HAZY (*)    Ketones, ur 20 (*)    All other components within normal limits  GROUP A STREP BY PCR  URINE CULTURE  CULTURE, BLOOD (ROUTINE X 2)  CULTURE, BLOOD (ROUTINE X 2)  LACTIC ACID, PLASMA  PROTIME-INR  APTT  MONONUCLEOSIS SCREEN  I-STAT BETA HCG BLOOD, ED ([facility], [facility], [facility] ONLY)    EKG EKG Interpretation  Date/Time:  Tuesday May 28 2020 18:10:13 EDT Ventricular Rate:  89 PR Interval:    QRS Duration: 101 QT Interval:  351 QTC Calculation: 427 R Axis:   75 Text Interpretation: Sinus rhythm Minimal ST depression, inferior leads Since last tracing Non-specific ST-t changes Confirmed by Susy Frizzle [phone]) on 05/28/2020 6:17:05 PM   Radiology DG Chest Port 1 View  Result Date: 05/28/2020 CLINICAL DATA:  Questionable sepsis EXAM: PORTABLE CHEST 1 VIEW COMPARISON:  None. FINDINGS: Heart and mediastinal contours are within normal limits.  No focal opacities or effusions. No acute bony abnormality. IMPRESSION: No active disease. Electronically Signed   By: Charlett Nose M.D.   On: 05/28/2020 18:15    Procedures Procedures  Medications Ordered in the ED Medications  acetaminophen (TYLENOL) tablet 650 mg (650 mg Oral Given 05/28/20 1813)  ibuprofen (ADVIL) tablet 800 mg (800 mg Oral Given 05/28/20 1813)  lactated ringers bolus 1,000 mL (0 mLs Intravenous Stopped 05/28/20 2107)  potassium chloride SA (KLOR-CON) CR tablet 40 mEq (40 mEq Oral Given 05/28/20 2035)     MDM Rules/Calculators/A&P MDM Patient with fever, sore throat and myalgias. Given her possible exposure to 'flesh eating bacteria', will check cultures. She has a dry mouth, so will give a liter of LR pending lactic acid. Not hypotensive. Add strep and mono for pharyngitis evaluation.   ED Course  I have reviewed the triage vital signs and the nursing notes.  Pertinent labs & imaging results that were available during my care of the patient were reviewed by me and considered in my medical decision making (see chart for details).  Clinical Course as of 05/28/20 2235  Tue May 28, 2020  1807  CBC with mild leukocytosis and mild anemia. [CS]  1819 CXR is clear [CS]  1848 UA neg for infection.  [CS]  1849 Mono is neg. CMP with moderate hypokalemia. Will replete orally.  [CS]  1850 Coags normal.  [CS]  1901 Pregnancy and Lactic acid are neg.  [CS]  1943 Strep is negative. BP and HR remain WNL.  [CS]  2046 Covid is positive, likely the source of her symptoms. She states she had a previous covid infection about 6 months ago but has not been vaccinated.  [CS]  2055 Patient sitting up, eating a sandwich. Informed of positive Covid test. She is familiar with quarantine instructions from previous infection. Advised to return for worsening symptoms or for any other concerns.  [CS]    Clinical Course User Index [CS] Pollyann SavoySheldon, Kimarion Chery B, MD    Final Clinical Impression(s) /  ED Diagnoses Final diagnoses:  COVID-19    Rx / DC Orders ED Discharge Orders    None       Pollyann SavoySheldon, Talani Brazee B, MD 05/28/20 2235

## 2020-05-28 NOTE — ED Triage Notes (Signed)
PT from home taking care of mother who has flesh eating bacteria. Pt believes she has this now. Spot neck , abcess in mouth and side of mouth and throat. T103.1 after 1500 mg tylenol before EMS arrived  Bp 130/70 P106 R 23 CAP 33 SPo2 99 RA  20g RA  8 mg zofran and N/S

## 2020-05-29 ENCOUNTER — Encounter: Payer: Self-pay | Admitting: Adult Health

## 2020-05-29 ENCOUNTER — Telehealth: Payer: Self-pay

## 2020-05-29 ENCOUNTER — Ambulatory Visit (HOSPITAL_COMMUNITY)
Admission: RE | Admit: 2020-05-29 | Discharge: 2020-05-29 | Disposition: A | Discharge status: Home / Self Care | Payer: Self-pay | Source: Ambulatory Visit | Attending: Pulmonary Disease | Admitting: Pulmonary Disease

## 2020-05-29 ENCOUNTER — Other Ambulatory Visit: Payer: Self-pay | Admitting: Adult Health

## 2020-05-29 DIAGNOSIS — U071 COVID-19: Secondary | ICD-10-CM

## 2020-05-29 MED ORDER — FAMOTIDINE IN NACL 20-0.9 MG/50ML-% IV SOLN: 20.0000 mg | Freq: Once | INTRAVENOUS | Status: DC | PRN

## 2020-05-29 MED ORDER — SODIUM CHLORIDE 0.9 % IV SOLN: INTRAVENOUS | Status: DC | PRN

## 2020-05-29 MED ORDER — SOTROVIMAB 500 MG/8ML IV SOLN
500.0000 mg | Freq: Once | INTRAVENOUS | Status: AC
  Administered 2020-05-29: 500 mg via INTRAVENOUS

## 2020-05-29 MED ORDER — METHYLPREDNISOLONE SODIUM SUCC 125 MG IJ SOLR: 125.0000 mg | Freq: Once | INTRAMUSCULAR | Status: DC | PRN

## 2020-05-29 MED ORDER — DIPHENHYDRAMINE HCL 50 MG/ML IJ SOLN: 50.0000 mg | Freq: Once | INTRAMUSCULAR | Status: DC | PRN

## 2020-05-29 MED ORDER — ALBUTEROL SULFATE HFA 108 (90 BASE) MCG/ACT IN AERS: 2.0000 | INHALATION_SPRAY | Freq: Once | RESPIRATORY_TRACT | Status: DC | PRN

## 2020-05-29 MED ORDER — ACETAMINOPHEN 325 MG PO TABS
650.0000 mg | ORAL_TABLET | Freq: Four times a day (QID) | ORAL | Status: DC | PRN
  Administered 2020-05-29: 650 mg via ORAL
  Filled 2020-05-29: qty 2

## 2020-05-29 MED ORDER — EPINEPHRINE 0.3 MG/0.3ML IJ SOAJ: 0.3000 mg | Freq: Once | INTRAMUSCULAR | Status: DC | PRN

## 2020-05-29 NOTE — Telephone Encounter (Signed)
Called to discuss with patient about COVID-19 symptoms and the use of one of the available treatments for those with mild to moderate Covid symptoms and at a high risk of hospitalization.  Pt appears to qualify for outpatient treatment due to co-morbid conditions and/or a member of an at-risk group in accordance with the FDA Emergency Use Authorization.    Symptom onset: 05/28/19 Vaccinated: No Booster?No Immunocompromised? No Qualifiers: Obesity Pt. States throat "feels worse, more swollen." Instructed to call her PCP and to return to ED for worsening of symptoms. Pt. Would like to speak with APP.  Gabrielle Roy

## 2020-05-29 NOTE — Progress Notes (Signed)
I connected by phone with Gabrielle Roy on 05/29/2020 at 10:40 AM to discuss the potential use of a new treatment for mild to moderate COVID-19 viral infection in non-hospitalized patients.  This patient is a 30 y.o. female that meets the FDA criteria for Emergency Use Authorization of COVID monoclonal antibody sotrovimab.  Has a (+) direct SARS-CoV-2 viral test result  Has mild or moderate COVID-19   Is NOT hospitalized due to COVID-19  Is within 10 days of symptom onset  Has at least one of the high risk factor(s) for progression to severe COVID-19 and/or hospitalization as defined in EUA.  Specific high risk criteria : BMI > 25   Sx onset 05/27/2020  Cost reviewed   I have spoken and communicated the following to the patient or parent/caregiver regarding COVID monoclonal antibody treatment:  1. FDA has authorized the emergency use for the treatment of mild to moderate COVID-19 in adults and pediatric patients with positive results of direct SARS-CoV-2 viral testing who are 81 years of age and older weighing at least 40 kg, and who are at high risk for progressing to severe COVID-19 and/or hospitalization.  2. The significant known and potential risks and benefits of COVID monoclonal antibody, and the extent to which such potential risks and benefits are unknown.  3. Information on available alternative treatments and the risks and benefits of those alternatives, including clinical trials.  4. Patients treated with COVID monoclonal antibody should continue to self-isolate and use infection control measures (e.g., wear mask, isolate, social distance, avoid sharing personal items, clean and disinfect "high touch" surfaces, and frequent handwashing) according to CDC guidelines.   5. The patient or parent/caregiver has the option to accept or refuse COVID monoclonal antibody treatment.  After reviewing this information with the patient, the patient has agreed to receive one of the  available covid 19 monoclonal antibodies and will be provided an appropriate fact sheet prior to infusion. [name], NP 05/29/2020 10:40 AM

## 2020-05-29 NOTE — Discharge Instructions (Signed)

## 2020-05-29 NOTE — Progress Notes (Signed)
Patient reviewed Fact Sheet for Patients, Parents, and Caregivers for Emergency Use Authorization (EUA) of sotrovimab for the Treatment of Coronavirus. Patient also reviewed and is agreeable to the estimated cost of treatment. Patient is agreeable to proceed.   

## 2020-05-29 NOTE — Progress Notes (Signed)
Diagnosis: COVID-19  Physician: Dr. Patrick Wright  Procedure: Covid Infusion Clinic Med: Sotrovimab infusion - Provided patient with sotrovimab fact sheet for patients, parents, and caregivers prior to infusion.   Complications: No immediate complications noted  Discharge: Discharged home    

## 2020-05-30 LAB — URINE CULTURE

## 2020-06-02 LAB — CULTURE, BLOOD (ROUTINE X 2)
Culture: NO GROWTH
Culture: NO GROWTH
Special Requests: ADEQUATE

## 2020-07-18 ENCOUNTER — Encounter: Payer: Self-pay | Admitting: Obstetrics & Gynecology

## 2020-07-18 ENCOUNTER — Ambulatory Visit (INDEPENDENT_AMBULATORY_CARE_PROVIDER_SITE_OTHER): Payer: Medicaid Other | Admitting: Obstetrics & Gynecology

## 2020-07-18 ENCOUNTER — Other Ambulatory Visit: Payer: Self-pay

## 2020-07-18 VITALS — BP 124/76 | Ht 60.0 in | Wt 175.0 lb

## 2020-07-18 DIAGNOSIS — R102 Pelvic and perineal pain: Secondary | ICD-10-CM | POA: Diagnosis not present

## 2020-07-18 DIAGNOSIS — R110 Nausea: Secondary | ICD-10-CM

## 2020-07-18 DIAGNOSIS — S3141XA Laceration without foreign body of vagina and vulva, initial encounter: Secondary | ICD-10-CM | POA: Diagnosis not present

## 2020-07-18 DIAGNOSIS — R399 Unspecified symptoms and signs involving the genitourinary system: Secondary | ICD-10-CM | POA: Diagnosis not present

## 2020-07-18 LAB — POCT URINALYSIS DIPSTICK
Bilirubin, UA: NEGATIVE
Blood, UA: NEGATIVE
Glucose, UA: NEGATIVE
Ketones, UA: NEGATIVE
Leukocytes, UA: NEGATIVE
Nitrite, UA: NEGATIVE
Protein, UA: NEGATIVE
Spec Grav, UA: 1.01 (ref 1.010–1.025)
Urobilinogen, UA: 0.2 E.U./dL
pH, UA: 5 (ref 5.0–8.0)

## 2020-07-18 LAB — POCT URINE PREGNANCY: Preg Test, Ur: NEGATIVE

## 2020-07-18 MED ORDER — LIDOCAINE HCL URETHRAL/MUCOSAL 2 % EX GEL: 1.0000 "application " | CUTANEOUS | 2 refills | Status: DC | PRN

## 2020-07-18 NOTE — Progress Notes (Signed)
GYNECOLOGY OFFICE VISIT NOTE  History:   Gabrielle Roy is a 30 y.o. [phone] here today for evaluation of vulvar pain.  Sustained a tear outside her vagina after falling down stairs at work, ended up doing a split on the stairs. This occurred on 07/16/20.   Noticed  the tear had some bleeding initially, none now. But still hurts.  Also burns during urination.  She also reports having some increased urination and pain during urination, wants to be checked for UTI.  Also feels nauseated, wants a pregnancy test. She denies any abnormal vaginal discharge, other pelvic pain or other concerns.    Past Medical History:  Diagnosis Date  . Anxiety   . Common migraine with intractable migraine 03/10/2018  . Depression   . Epigastric hernia   . History of kidney stones   . History of morbid obesity   . Hx of pre-eclampsia in prior pregnancy, currently pregnant, first trimester   . Hydronephrosis, left    Bilateral  . Kidney stone    Stent placed in Feb 2017  . PONV (postoperative nausea and vomiting)    nausea during last c section    Past Surgical History:  Procedure Laterality Date  . CESAREAN SECTION    . CESAREAN SECTION N/A 02/20/2016   Procedure: REPEAT CESAREAN SECTION;  Surgeon: Hermina Staggers, MD;  Location: [facility] BIRTHING SUITES;  Service: Obstetrics;  Laterality: N/A;  . CESAREAN SECTION N/A 07/08/2018   Procedure: CESAREAN SECTION;  Surgeon: Allie Bossier, MD;  Location: [facility] LD ORS;  Service: Obstetrics;  Laterality: N/A;  . CHOLECYSTECTOMY    . CYSTOSCOPY W/ URETERAL STENT PLACEMENT Left 04/26/2018   Procedure: CYSTOSCOPY WITH RETROGRADE PYELOGRAM/URETERAL STENT PLACEMENT;  Surgeon: Crist Fat, MD;  Location: [facility] ORS;  Service: Urology;  Laterality: Left;  . CYSTOSCOPY WITH STENT PLACEMENT    . FINGER SURGERY    . GASTRIC BYPASS    . HERNIA REPAIR      The following portions of the patient's history were reviewed and updated as appropriate: allergies, current medications,  past family history, past medical history, past social history, past surgical history and problem list.   Health Maintenance:  Normal pap on 12/29/2017.  Review of Systems:  Pertinent items noted in HPI and remainder of comprehensive ROS otherwise negative.  Physical Exam:  BP 124/76   Ht 5' (1.524 m)   Wt 175 lb (79.4 kg)   Breastfeeding No   BMI 34.18 kg/m  CONSTITUTIONAL: Well-developed, well-nourished female in no acute distress.  HEENT:  Normocephalic, atraumatic. External right and left ear normal. No scleral icterus.  NECK: Normal range of motion, supple, no masses noted on observation SKIN: No rash noted. Not diaphoretic. No erythema. No pallor. MUSCULOSKELETAL: Normal range of motion. No edema noted. NEUROLOGIC: Alert and oriented to person, place, and time. Normal muscle tone coordination. No cranial nerve deficit noted. PSYCHIATRIC: Normal mood and affect. Normal behavior. Normal judgment and thought content. CARDIOVASCULAR: Normal heart rate noted RESPIRATORY: Effort and breath sounds normal, no problems with respiration noted ABDOMEN: No masses noted. No other overt distention noted.   PELVIC: Healing, well-approximated superficial perineal laceration about 1 cm in length, in lower part of the perineum closer to anal verge.  No erythema, no drainage, no open area noted.  No need for repair, healing well.  Otherwise, normal appearing external genitalia; normal urethral meatus; normal appearing distal vaginal mucosa.  No abnormal discharge noted. Performed in the presence of a chaperone  Labs and Imaging Results for orders placed or performed in visit on 07/18/20 (from the past 168 hour(s))  POCT urine pregnancy   Collection Time: 07/18/20 10:07 AM  Result Value Ref Range   Preg Test, Ur Negative Negative  POCT Urinalysis Dipstick   Collection Time: 07/18/20 10:09 AM  Result Value Ref Range   Color, UA     Clarity, UA     Glucose, UA Negative Negative   Bilirubin, UA  neg    Ketones, UA neg    Spec Grav, UA 1.010 1.010 - 1.025   Blood, UA neg    pH, UA 5.0 5.0 - 8.0   Protein, UA Negative Negative   Urobilinogen, UA 0.2 0.2 or 1.0 E.U./dL   Nitrite, UA neg    Leukocytes, UA Negative Negative   Appearance     Odor     No results found.    Assessment and Plan:      1. Nauseous Not pregnant, follow up with PCP or [place] if continues - POCT urine pregnancy  2. UTI symptoms UA negative, will follow up culture. Symptoms could be due to laceration, advised to use pericare bottle/squirt bottle with water during urination to wash urine away from healing laceration.  - POCT Urinalysis Dipstick - Urine Culture  3. Laceration of perineum in female, initial encounter Healing well, no intervention needed. No signs of infection/inflammation.  Advised to use Dermoplast for pain, sitz baths.  Lidocaine jelly prescribed for pain. Pelvic rest recommended for at least the next couple of weeks. Told to call/come back for worsening symptoms. - lidocaine (XYLOCAINE) 2 % jelly; Apply 1 application topically as needed.  Dispense: 30 mL; Refill: 2  Routine preventative health maintenance measures emphasized. Please refer to After Visit Summary for other counseling recommendations.   Return for any worsening symptoms or gynecologic concerns.    I spent 10 minutes dedicated to the care of this patient including pre-visit review of records, face to face time with the patient discussing her conditions and treatments and post visit orders.    Jaynie Collins, MD, FACOG Obstetrician & Gynecologist, [facility] for [employer], [facility]

## 2020-07-18 NOTE — Patient Instructions (Addendum)
Can use Dermoplast also for pain Take Ibuprofen    Vaginal Laceration  A vaginal laceration is a cut or tear of the opening of the vagina, the inside of the vaginal canal, or the skin between the vaginal opening and the anus (perineum). What are the causes? This condition may be caused by:  Childbirth. It may also be caused by tools that are used to help deliver a baby, such as forceps.  Sex.  An injury from sports, bike riding, or other activities.  Thinning, dryness, or irritation of the vagina due to low estrogen levels (vulvovaginal atrophy). What increases the risk? You are more likely to develop this condition if you:  Give birth vaginally.  Are sexually active.  Have gone through menopause.  Have low estrogen levels due to certain medicines, breast cancer treatments, or breastfeeding. What are the signs or symptoms? Symptoms of this condition include:  Slight to heavy vaginal bleeding.  Vaginal swelling.  Mild to severe pain.  Vaginal tenderness.  Painful urination.  Pain or discomfort during sex. How is this diagnosed? If the tear happened during childbirth, your health care provider can diagnose the tear at that time. Other tears or lacerations can be diagnosed with your medical history and a physical exam. You may have other tests, including:  Blood tests to check your hormone levels and blood loss.  Imaging tests, such as an ultrasonogram or CT scan, to rule out other health issues, such as enlarged lymph nodes or tumors. How is this treated? Treatment depends on the severity of the tear or laceration. Minor injuries may heal on their own. If needed, this condition may be treated with:  Stitches (sutures).  Medicines, such as: ? Creams to reduce pain. ? Vaginal lubricants to treat vaginal dryness. ? Topical or oral hormonal therapy. ? Antibiotics. These may be taken orally or given as ointments to prevent or treat infection. Surgery may be needed  if the tear is severe. Follow these instructions at home: Wound care  Follow instructions from your health care provider about how to take care of your wound. Make sure you: ? Wash your hands with soap and water before and after you change your bandage (dressing). If soap and water are not available, use hand sanitizer. ? Change your dressing as told by your health care provider. ? Keep the area clean. ? Leave sutures in place, if this applies. These skin closures may need to stay in place for 2 weeks or longer.  Check your wound every day for signs of infection. Check for: ? Redness, swelling, or pain. ? Fluid or blood. ? Warmth. ? Pus or a bad smell. Managing pain and swelling  If directed, put ice on the injured area: ? Put ice in a plastic bag. ? Place a towel between your skin and the bag. ? Leave the ice on for 20 minutes, 2-3 times a day.   Medicines  Take or apply over-the-counter and prescription medicines only as told by your health care provider.  If you were prescribed an antibiotic medicine, take it as told by your health care provider. Do not stop using the antibiotic even if you start to feel better.  Ask your health care provider if the medicine prescribed to you: ? Requires you to avoid driving or using heavy machinery. ? Can cause constipation. You may need to take actions to prevent or treat constipation, such as:  Drink enough fluid to keep your urine pale yellow.  Take over-the-counter or prescription  medicines.  Eat foods that are high in fiber, such as beans, whole grains, and fresh fruits and vegetables.  Limit foods that are high in fat and processed sugars, such as fried or sweet foods. General instructions  Take a sitz bath 2-3 times a day or as told by your health care provider. A sitz bath is a shallow, warm water bath that is taken while you are sitting down. The water should only come up to your hips and should cover your buttocks.  Avoid  sitting or standing for long periods of time.  Lie on your side while sleeping or resting.  Avoid straining during bowel movements. Ask your health care provider if a stool softener is needed.  Do not douche, use a tampon, or have sex until your health care provider approves.  Keep all follow-up visits as told by your health care provider. This is important.   Contact a health care provider if:  You have: ? More redness, swelling, or pain in the vaginal area. ? More fluid or blood coming from your vaginal tear. ? Pus or a bad smell coming from your vaginal area. ? A fever. ? A tear that breaks open after it healed or was repaired. ? A burning pain when you urinate.  You continue to have pain during sex after the tear heals.  You are urinating more often than usual or feel an increased urgency to urinate. Get help right away if you:  Feel light-headed.  Have nausea or vomiting.  Have severe pain around your vagina or in your pelvis or lower abdomen.  Have heavy vaginal bleeding, or you are soaking more than 1 pad an hour. Summary  A vaginal laceration is a cut or tear of the opening of the vagina, the inside of the vaginal canal, or the skin between the vaginal opening and the anus (perineum).  It is caused by childbirth, sex, injury, or thinning, dryness, or irritation of the vagina due to low estrogen levels.  Treatment depends on the severity of the tear or laceration. Minor injuries may heal on their own. It may be treated with stitches, medicines, or surgery if the tear is severe. This information is not intended to replace advice given to you by your health care provider. Make sure you discuss any questions you have with your health care provider. Document Revised: 10/07/2017 Document Reviewed: 10/07/2017 Elsevier Patient Education  2021 [employer].

## 2020-07-19 ENCOUNTER — Telehealth: Payer: Self-pay

## 2020-07-19 DIAGNOSIS — N3000 Acute cystitis without hematuria: Secondary | ICD-10-CM

## 2020-07-19 NOTE — Telephone Encounter (Signed)
Pt called and spoke to [facility] and told her she lost her written prescription after her appt yesterday. Pt is requesting new Rx called in for Lidocaine Jelly. I spoke with pharmacist at [facility] and gave verbal Rx over phone for lidocaine jelly 2% as needed topically 43mL with 2 refills. Pt was informed over MyChart message.

## 2020-07-20 LAB — URINE CULTURE
MICRO NUMBER:: 11886029
SPECIMEN QUALITY:: ADEQUATE

## 2020-07-22 MED ORDER — AMOXICILLIN-POT CLAVULANATE 875-125 MG PO TABS: 1.0000 | ORAL_TABLET | Freq: Two times a day (BID) | ORAL | 0 refills | Status: AC

## 2020-10-13 ENCOUNTER — Emergency Department (HOSPITAL_BASED_OUTPATIENT_CLINIC_OR_DEPARTMENT_OTHER)
Admission: EM | Admit: 2020-10-13 | Discharge: 2020-10-13 | Disposition: A | Discharge status: Home / Self Care | Payer: Medicaid Other | Attending: Emergency Medicine | Admitting: Emergency Medicine

## 2020-10-13 ENCOUNTER — Encounter (HOSPITAL_BASED_OUTPATIENT_CLINIC_OR_DEPARTMENT_OTHER): Payer: Self-pay

## 2020-10-13 ENCOUNTER — Emergency Department (HOSPITAL_BASED_OUTPATIENT_CLINIC_OR_DEPARTMENT_OTHER): Payer: Medicaid Other

## 2020-10-13 ENCOUNTER — Other Ambulatory Visit: Payer: Self-pay

## 2020-10-13 DIAGNOSIS — N200 Calculus of kidney: Secondary | ICD-10-CM

## 2020-10-13 DIAGNOSIS — R109 Unspecified abdominal pain: Secondary | ICD-10-CM | POA: Insufficient documentation

## 2020-10-13 DIAGNOSIS — F17210 Nicotine dependence, cigarettes, uncomplicated: Secondary | ICD-10-CM | POA: Insufficient documentation

## 2020-10-13 DIAGNOSIS — R300 Dysuria: Secondary | ICD-10-CM | POA: Diagnosis not present

## 2020-10-13 DIAGNOSIS — R3912 Poor urinary stream: Secondary | ICD-10-CM | POA: Insufficient documentation

## 2020-10-13 DIAGNOSIS — D649 Anemia, unspecified: Secondary | ICD-10-CM

## 2020-10-13 DIAGNOSIS — M549 Dorsalgia, unspecified: Secondary | ICD-10-CM | POA: Diagnosis not present

## 2020-10-13 DIAGNOSIS — R112 Nausea with vomiting, unspecified: Secondary | ICD-10-CM | POA: Diagnosis not present

## 2020-10-13 DIAGNOSIS — K219 Gastro-esophageal reflux disease without esophagitis: Secondary | ICD-10-CM | POA: Insufficient documentation

## 2020-10-13 LAB — URINALYSIS, ROUTINE W REFLEX MICROSCOPIC
Bilirubin Urine: NEGATIVE
Glucose, UA: NEGATIVE mg/dL
Ketones, ur: NEGATIVE mg/dL
Leukocytes,Ua: NEGATIVE
Nitrite: NEGATIVE
Protein, ur: NEGATIVE mg/dL
Specific Gravity, Urine: 1.01 (ref 1.005–1.030)
pH: 6 (ref 5.0–8.0)

## 2020-10-13 LAB — COMPREHENSIVE METABOLIC PANEL
ALT: 15 U/L (ref 0–44)
AST: 23 U/L (ref 15–41)
Albumin: 3.6 g/dL (ref 3.5–5.0)
Alkaline Phosphatase: 90 U/L (ref 38–126)
Anion gap: 6 (ref 5–15)
BUN: 8 mg/dL (ref 6–20)
CO2: 25 mmol/L (ref 22–32)
Calcium: 8.6 mg/dL — ABNORMAL LOW (ref 8.9–10.3)
Chloride: 107 mmol/L (ref 98–111)
Creatinine, Ser: 0.66 mg/dL (ref 0.44–1.00)
GFR, Estimated: >60 mL/min (ref >60)
Glucose, Bld: 83 mg/dL (ref 70–99)
Potassium: 3.9 mmol/L (ref 3.5–5.1)
Sodium: 138 mmol/L (ref 135–145)
Total Bilirubin: 0.7 mg/dL (ref 0.3–1.2)
Total Protein: 6.1 g/dL — ABNORMAL LOW (ref 6.5–8.1)

## 2020-10-13 LAB — CBC WITH DIFFERENTIAL/PLATELET
Abs Immature Granulocytes: 0.01 10*3/uL (ref 0.00–0.07)
Basophils Absolute: 0.1 10*3/uL (ref 0.0–0.1)
Basophils Relative: 1 %
Eosinophils Absolute: 0.1 10*3/uL (ref 0.0–0.5)
Eosinophils Relative: 2 %
HCT: 31.6 % — ABNORMAL LOW (ref 36.0–46.0)
Hemoglobin: 10.1 g/dL — ABNORMAL LOW (ref 12.0–15.0)
Immature Granulocytes: 0 %
Lymphocytes Relative: 36 %
Lymphs Abs: 2.1 10*3/uL (ref 0.7–4.0)
MCH: 28.8 pg (ref 26.0–34.0)
MCHC: 32 g/dL (ref 30.0–36.0)
MCV: 90 fL (ref 80.0–100.0)
Monocytes Absolute: 0.3 10*3/uL (ref 0.1–1.0)
Monocytes Relative: 5 %
Neutro Abs: 3.2 10*3/uL (ref 1.7–7.7)
Neutrophils Relative %: 56 %
Platelets: 253 10*3/uL (ref 150–400)
RBC: 3.51 MIL/uL — ABNORMAL LOW (ref 3.87–5.11)
RDW: 13.8 % (ref 11.5–15.5)
WBC: 5.8 10*3/uL (ref 4.0–10.5)
nRBC: 0 % (ref 0.0–0.2)

## 2020-10-13 LAB — PREGNANCY, URINE: Preg Test, Ur: NEGATIVE

## 2020-10-13 LAB — URINALYSIS, MICROSCOPIC (REFLEX)

## 2020-10-13 LAB — LIPASE, BLOOD: Lipase: 38 U/L (ref 11–51)

## 2020-10-13 MED ORDER — DICYCLOMINE HCL 20 MG PO TABS: 20.0000 mg | ORAL_TABLET | Freq: Two times a day (BID) | ORAL | 0 refills | Status: DC

## 2020-10-13 MED ORDER — ONDANSETRON HCL 4 MG/2ML IJ SOLN
4.0000 mg | Freq: Once | INTRAMUSCULAR | Status: AC
Start: 2020-10-13 — End: 2020-10-13
  Administered 2020-10-13: 4 mg via INTRAVENOUS
  Filled 2020-10-13: qty 2

## 2020-10-13 MED ORDER — ONDANSETRON HCL 4 MG PO TABS: 4.0000 mg | ORAL_TABLET | Freq: Four times a day (QID) | ORAL | 0 refills | Status: DC

## 2020-10-13 MED ORDER — MORPHINE SULFATE (PF) 4 MG/ML IV SOLN
4.0000 mg | Freq: Once | INTRAVENOUS | Status: AC
Start: 2020-10-13 — End: 2020-10-13
  Administered 2020-10-13: 4 mg via INTRAVENOUS
  Filled 2020-10-13: qty 1

## 2020-10-13 MED ORDER — SODIUM CHLORIDE 0.9 % IV SOLN
1000.0000 mL | Freq: Once | INTRAVENOUS | Status: AC
  Administered 2020-10-13: 1000 mL via INTRAVENOUS

## 2020-10-13 MED ORDER — MORPHINE SULFATE (PF) 4 MG/ML IV SOLN
4.0000 mg | Freq: Once | INTRAVENOUS | Status: AC
  Administered 2020-10-13: 4 mg via INTRAVENOUS
  Filled 2020-10-13: qty 1

## 2020-10-13 MED ORDER — ONDANSETRON HCL 4 MG/2ML IJ SOLN
4.0000 mg | Freq: Once | INTRAMUSCULAR | Status: AC
  Administered 2020-10-13: 4 mg via INTRAVENOUS
  Filled 2020-10-13: qty 2

## 2020-10-13 NOTE — ED Triage Notes (Signed)
Pt arrives with c/o "right kidney pain" on and off for 3-4 days. Pt reports urinary urgency.

## 2020-10-13 NOTE — Discharge Instructions (Addendum)
Take bentyl as needed for abdominal pain. You may take zofran as needed for nausea. You may also take tylenol for pain if the bentyl does not work.  When you have a primary care doctor, please follow-up about the anemia.  Although this is not an emergent finding, it has been consistent for the last 2 years and I think it is important you have follow-up regarding this finding. If things change or worsen please come back to ED for further evaluation as needed.

## 2020-10-13 NOTE — ED Provider Notes (Signed)
[facility] EMERGENCY DEPARTMENT Provider Note   CSN: [id number] Arrival date & time: 10/13/20  1409     History Chief Complaint  Patient presents with   Flank Pain    Gabrielle Roy is a 30 y.o. female.  HPI  Patient presents with right flank pain x4 days.  Pain is intermittent, physical sharp stabbing pain on the right side of her back, does not radiate to the front.  There is associated dysuria and decreased urinary stream.  Unsure what elicits the pain, laying on her left side reduces it.  She has had nausea and 2 episodes of emesis yesterday.  Denies any constipation or diarrhea.  She had a fever of 100.5, took Tylenol which helped.  History of multiple kidney stones in the past, including stent placement.   S/P hernia repair, gastric bypass, cholecystectomy.  Past Medical History:  Diagnosis Date   Anxiety    Common migraine with intractable migraine 03/10/2018   Depression    Epigastric hernia    History of kidney stones    History of morbid obesity    Hx of pre-eclampsia in prior pregnancy, currently pregnant, first trimester    Hydronephrosis, left    Bilateral   Kidney stone    Stent placed in Feb 2017   PONV (postoperative nausea and vomiting)    nausea during last c section    Patient Active Problem List   Diagnosis Date Noted   Hydronephrosis, left 08/10/2018   Acute pyelonephritis 08/09/2018   Anemia in pregnancy 07/08/2018   Renal stones 04/25/2018   Common migraine with intractable migraine 03/10/2018   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   Postoperative intestinal malabsorption 06/07/2017   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   Former smoker 05/06/2017   Anxiety state 09/29/2013   Depressive disorder, not elsewhere classified 09/29/2013   Gastroesophageal reflux disease without esophagitis 08/16/2013    Past Surgical History:  Procedure Laterality Date   CESAREAN SECTION     CESAREAN SECTION N/A 02/20/2016    Procedure: REPEAT CESAREAN SECTION;  Surgeon: Hermina Staggers, MD;  Location: [facility] BIRTHING SUITES;  Service: Obstetrics;  Laterality: N/A;   CESAREAN SECTION N/A 07/08/2018   Procedure: CESAREAN SECTION;  Surgeon: Allie Bossier, MD;  Location: [facility] LD ORS;  Service: Obstetrics;  Laterality: N/A;   CHOLECYSTECTOMY     CYSTOSCOPY W/ URETERAL STENT PLACEMENT Left 04/26/2018   Procedure: CYSTOSCOPY WITH RETROGRADE PYELOGRAM/URETERAL STENT PLACEMENT;  Surgeon: Crist Fat, MD;  Location: [facility] ORS;  Service: Urology;  Laterality: Left;   CYSTOSCOPY WITH STENT PLACEMENT     FINGER SURGERY     GASTRIC BYPASS     HERNIA REPAIR       OB History     Gravida  3   Para  3   Term  2   Preterm  1   AB      Living  3      SAB      IAB      Ectopic      Multiple  0   Live Births  3           Family History  Problem Relation Age of Onset   Hypertension Father    Heart disease Father    Hearing loss Father     Social History   Tobacco Use   Smoking status: Every Day    Packs/day: 0.50    Types: Cigarettes   Smokeless tobacco: Never  Vaping Use   Vaping Use: Never used  Substance Use Topics   Alcohol use: No   Drug use: No    Home Medications Prior to Admission medications   Medication Sig Start Date End Date Taking? Authorizing Provider  etonogestrel (NEXPLANON) 68 MG IMPL implant 1 each by Subdermal route once.    [provider]  lidocaine (XYLOCAINE) 2 % jelly Apply 1 application topically as needed. 07/18/20   Tereso Newcomer, MD    Allergies    Alcohol, Flagyl [metronidazole], and Hydrocodone-acetaminophen  Review of Systems   Review of Systems  Constitutional:  Negative for chills and fever.  HENT:  Negative for ear pain and sore throat.   Eyes:  Negative for pain and visual disturbance.  Respiratory:  Negative for cough and shortness of breath.   Cardiovascular:  Negative for chest pain and palpitations.  Gastrointestinal:  Positive for  abdominal pain, nausea and vomiting.  Genitourinary:  Positive for decreased urine volume, difficulty urinating and flank pain. Negative for dysuria, hematuria, pelvic pain, vaginal bleeding, vaginal discharge and vaginal pain.  Musculoskeletal:  Negative for arthralgias and back pain.  Skin:  Negative for color change and rash.  Neurological:  Negative for seizures and syncope.  All other systems reviewed and are negative.  Physical Exam Updated Vital Signs BP 126/71 (BP Location: Left Arm)   Pulse 77   Temp 98.9 F (37.2 C) (Oral)   Resp 18   Ht 5' (1.524 m)   Wt 77.1 kg   LMP 10/13/2020   SpO2 99%   BMI 33.20 kg/m   Physical Exam Vitals and nursing note reviewed. Exam conducted with a chaperone present.  Constitutional:      Appearance: Normal appearance. She is obese.  HENT:     Head: Normocephalic and atraumatic.  Eyes:     General: No scleral icterus.       Right eye: No discharge.        Left eye: No discharge.     Extraocular Movements: Extraocular movements intact.     Pupils: Pupils are equal, round, and reactive to light.  Cardiovascular:     Rate and Rhythm: Normal rate and regular rhythm.     Pulses: Normal pulses.     Heart sounds: Normal heart sounds. No murmur heard.   No friction rub. No gallop.  Pulmonary:     Effort: Pulmonary effort is normal. No respiratory distress.     Breath sounds: Normal breath sounds.  Abdominal:     General: Abdomen is flat. Bowel sounds are normal. There is no distension.     Palpations: Abdomen is soft.     Tenderness: There is no abdominal tenderness. There is right CVA tenderness. There is no guarding.     Hernia: A hernia is present.     Comments: Soft reducible umbilical hernia.  Surgical scars without any dehiscence or signs of infection.  Abdomen is soft, there is some CVA tenderness and right-sided abdominal tenderness to palpation without guarding or rigidity.  No rebound.  Skin:    General: Skin is warm and dry.      Coloration: Skin is not jaundiced.  Neurological:     Mental Status: She is alert. Mental status is at baseline.     Coordination: Coordination normal.    ED Results / Procedures / Treatments   Labs (all labs ordered are listed, but only abnormal results are displayed) Labs Reviewed  URINALYSIS, ROUTINE W REFLEX MICROSCOPIC  PREGNANCY, URINE  EKG None  Radiology No results found.  Procedures Procedures   Medications Ordered in ED Medications  0.9 %  sodium chloride infusion (has no administration in time range)  morphine 4 MG/ML injection 4 mg (has no administration in time range)  ondansetron (ZOFRAN) injection 4 mg (has no administration in time range)    ED Course  I have reviewed the triage vital signs and the nursing notes.  Pertinent labs & imaging results that were available during my care of the patient were reviewed by me and considered in my medical decision making (see chart for details).  Clinical Course as of 10/13/20 1956  Sun Oct 13, 2020  1609 Urinalysis, Routine w reflex microscopic(!) Hemoglobin and urine, consistent with possible renal stone.  CT scan was already ordered.  No signs suggestive of UTI [HS]  1616 Pregnancy, urine Not ectopic [HS]  1616 Lipase, blood Doubt pancreatitis [HS]  1623 Comprehensive metabolic panel(!) No electrolyte derangement, no AKI, no renal impairment, no anion gap, no elevated LFT pattern concerning for biliary process or hepatitis. [HS]  1623 CBC with Differential(!) No leukocytosis concerning for intra-abdominal infection or inflammatory process.  Patient is anemic, but stable and already being followed for iron deficiency anemia. [HS]    Clinical Course User Index [HS] Theron Arista, PA-C   MDM Rules/Calculators/A&P                           Patient vitals are stable, she is nontoxic-appearing.  Flank pain on exam is consistent with possible nephrolithiasis, additionally she has a history of multiple  stones in the past.  Abdomen is soft, no peritoneal signs.  Umbilical hernia is palpable, but easily reducible and nontender.  Do not think this is incarcerated or strangulated hernia.  We will treat pain, nausea and initiate work-up.    Differential includes (but not limited to) nephrolithiasis, UTI, pyelonephritis, appendicitis, ectopic, ovarian cyst, gastritis.   See ED course for interpretation of labs and imaging as they pertain to the differential diagnosis.  Patient states her pain has improved on reevaluation.  She is resting comfortably, very stable vitals.  Evidence of nephrolithiasis but is nonobstructive, given that her pain is improved significantly I suspect she has passed the stone.  No leukocytosis or fever, I doubt that she has an infected stone.  UA without evidence of UTI.  Patient is mildly anemic, no history of it but it appears to be a chronic state.  No signs of acute blood loss, will have her follow-up with her primary care doctor for further evaluation and as needed.  Pain medicine nausea medicine prescribed as needed, return precautions given.  At this point I do not suspect any emergent pathology I think she is appropriate for discharge.  Patient verbalized understanding and agreement with the plan.  Final Clinical Impression(s) / ED Diagnoses Final diagnoses:  None    Rx / DC Orders ED Discharge Orders     None        Theron Arista, Cordelia Poche 10/13/20 2004    Arby Barrette, MD 11/10/20 1600

## 2020-12-03 IMAGING — US US RENAL
1 series · 15 of 25 positions shown · non-contrast
Comparison: 04/11/2018

CLINICAL DATA: LEFT flank pain. Nephrolithiasis. LEFT UVJ stone
identified on previous ultrasound.

EXAM:
RENAL / URINARY TRACT ULTRASOUND COMPLETE

[Series 1: us renal · 15 of 64 slices shown]
[im 1/64]
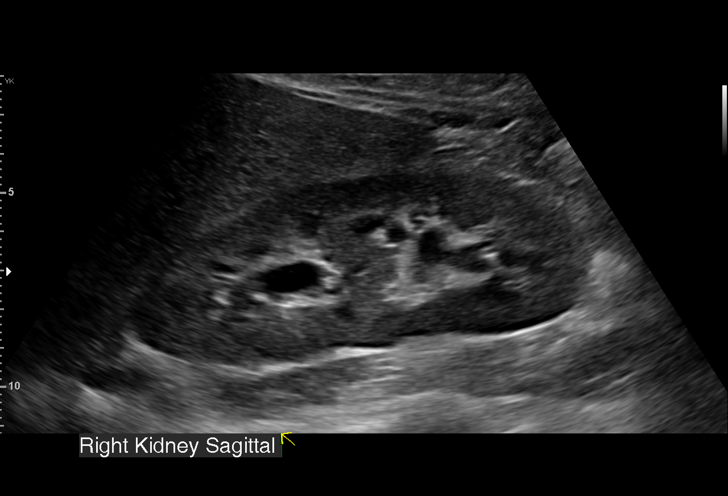
[im 6/64]
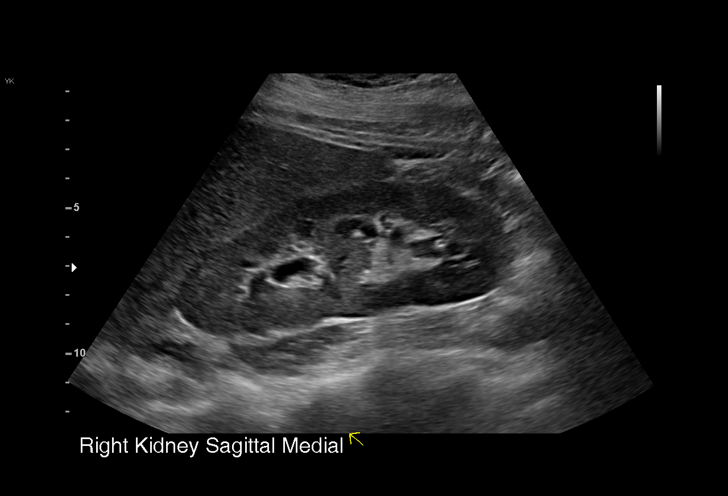
[im 11/64]
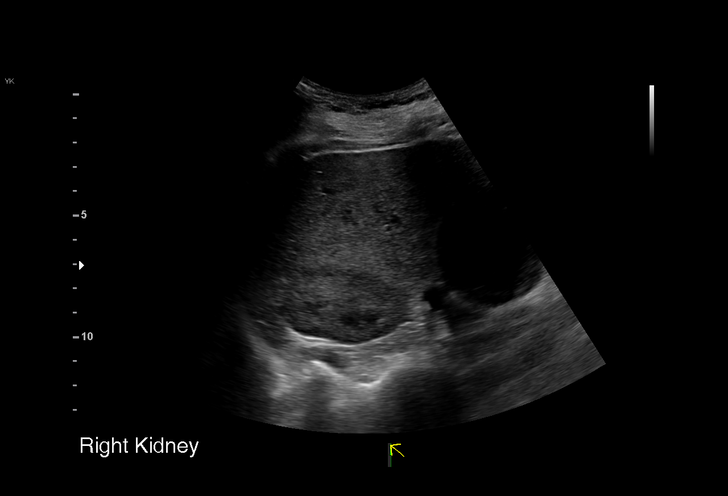
[im 14/64]
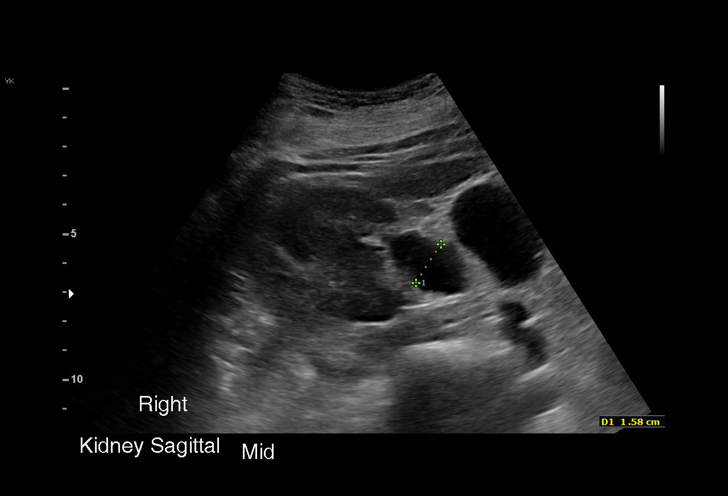
[im 19/64]
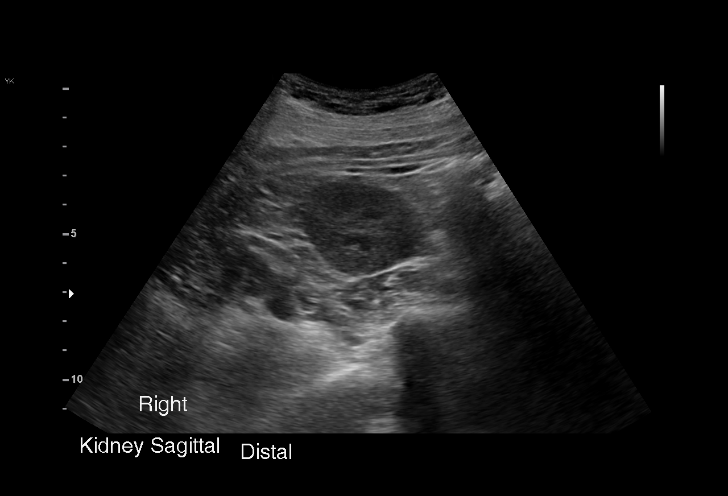
[im 24/64]
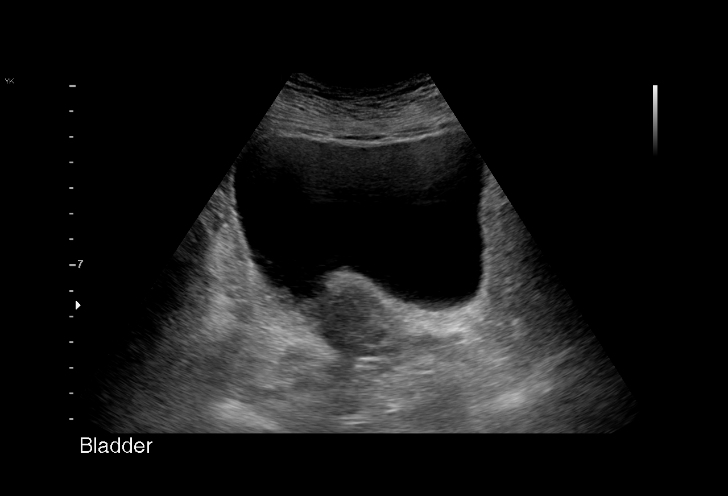
[im 27/64]
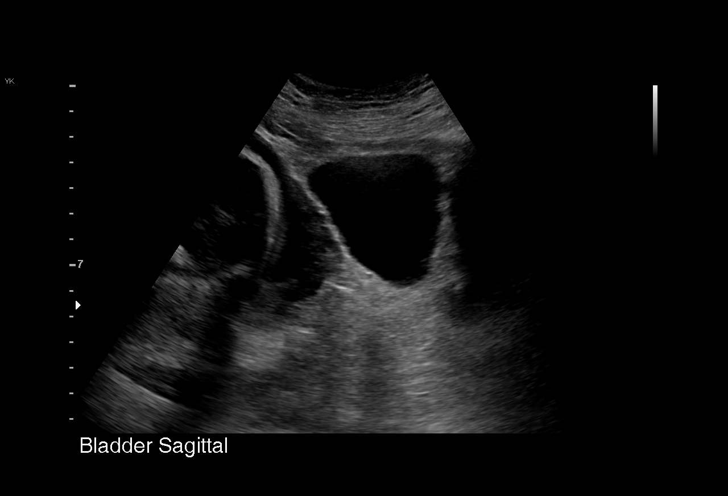
[im 32/64]
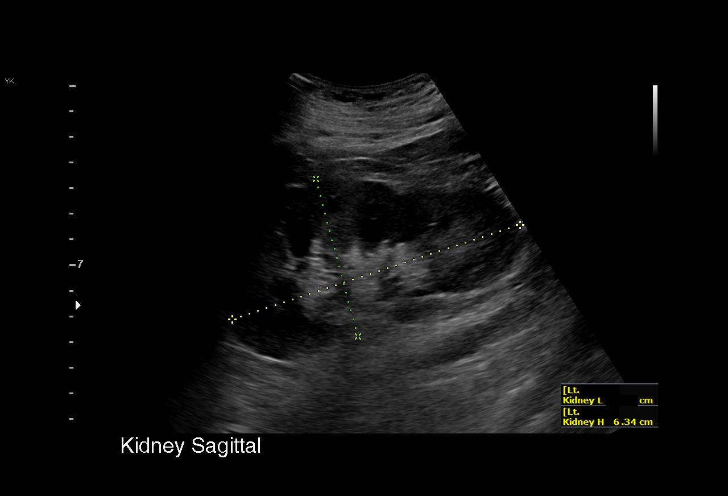
[im 37/64]
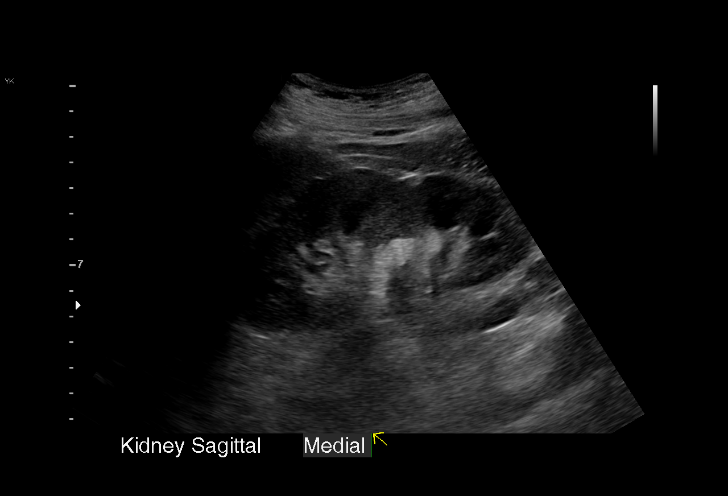
[im 40/64]
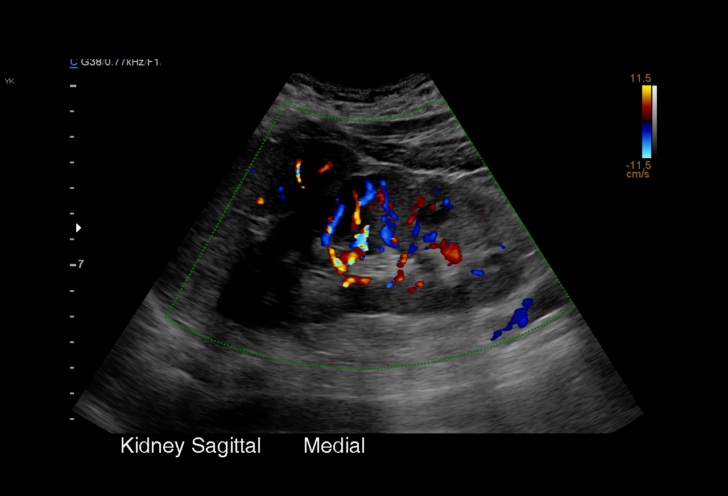
[im 45/64]
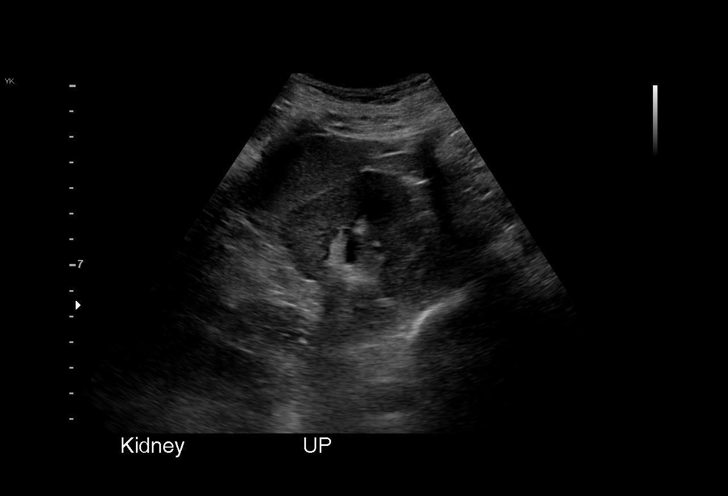
[im 50/64]
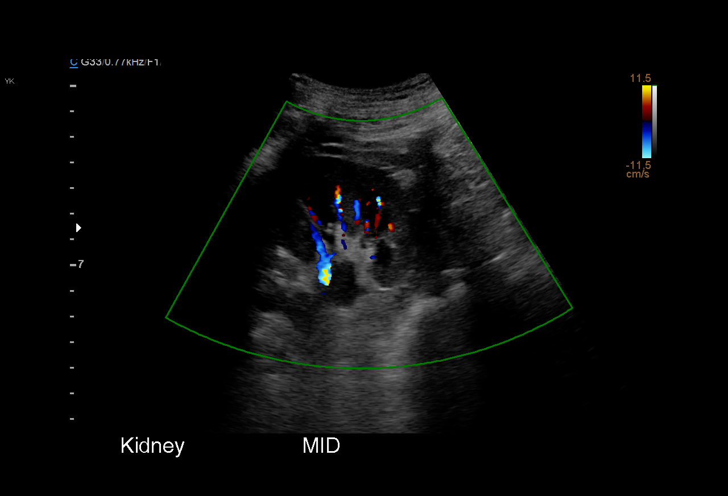
[im 53/64]
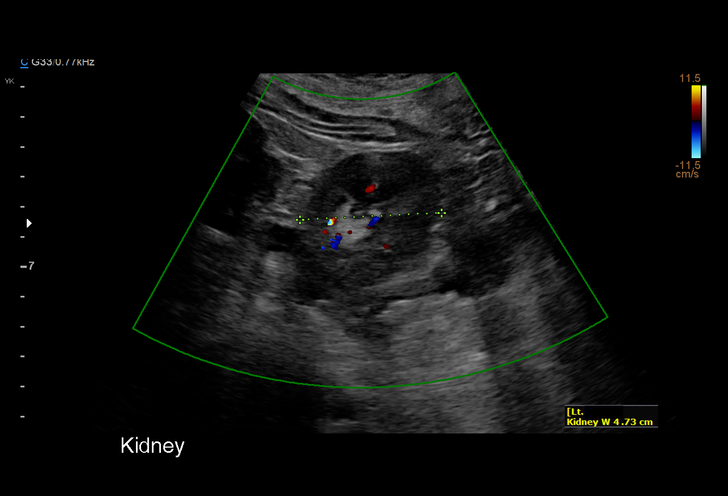
[im 58/64]
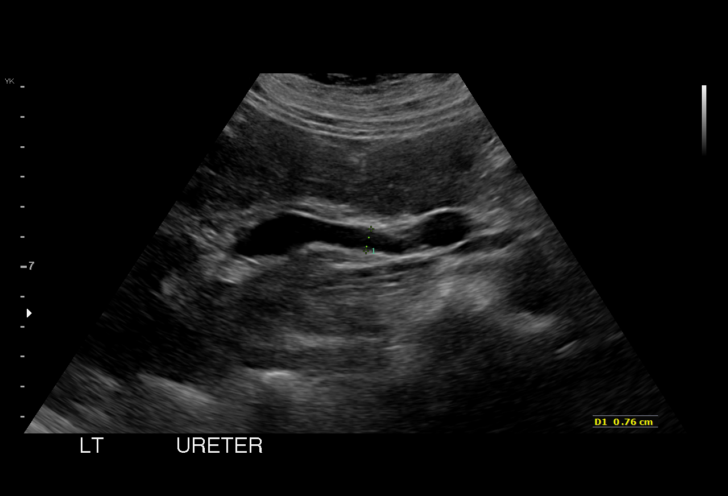
[im 64/64]
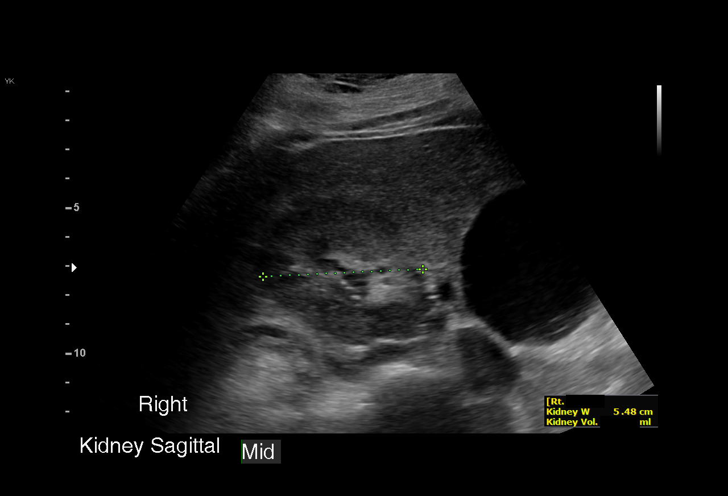

[15 of 25 positions shown; findings below may reference images not displayed]

FINDINGS: Right Kidney:

Renal measurements: 12.0 x 4.2 x 5.5 centimeter = volume: 145 mL.
There is mild RIGHT-sided hydronephrosis. Visualized proximal ureter
is unremarkable.

Left Kidney:

Renal measurements: 11.8 x 6.3 x 5.0 centimeters = volume: 191.6 ML.
Stable mild hydronephrosis. The visualized proximal ureter is
unremarkable. A ureteral stone is identified at the level of the
iliac artery. Stone measures 1.0 x 0.7 centimeters.

Bladder:

Appears normal for degree of bladder distention.
IMPRESSION: 1. Persistent bilateral hydronephrosis, stable in appearance.
2. The proximal LEFT ureteral stone has migrated distally to the
level of the crossing iliac artery.

## 2020-12-09 ENCOUNTER — Encounter: Payer: Self-pay | Admitting: Obstetrics and Gynecology

## 2020-12-09 ENCOUNTER — Other Ambulatory Visit: Payer: Self-pay

## 2020-12-09 ENCOUNTER — Other Ambulatory Visit (HOSPITAL_COMMUNITY)
Admission: RE | Admit: 2020-12-09 | Discharge: 2020-12-09 | Disposition: A | Discharge status: Home / Self Care | Payer: Medicaid Other | Source: Ambulatory Visit | Attending: Obstetrics and Gynecology | Admitting: Obstetrics and Gynecology

## 2020-12-09 ENCOUNTER — Ambulatory Visit: Payer: Medicaid Other | Admitting: Obstetrics and Gynecology

## 2020-12-09 VITALS — Resp 16 | Ht 60.0 in | Wt 176.0 lb

## 2020-12-09 DIAGNOSIS — N939 Abnormal uterine and vaginal bleeding, unspecified: Secondary | ICD-10-CM

## 2020-12-09 DIAGNOSIS — R1030 Lower abdominal pain, unspecified: Secondary | ICD-10-CM | POA: Diagnosis not present

## 2020-12-09 MED ORDER — MEGESTROL ACETATE 40 MG PO TABS: 40.0000 mg | ORAL_TABLET | Freq: Every day | ORAL | 1 refills | Status: DC

## 2020-12-09 NOTE — Progress Notes (Signed)
GYNECOLOGY OFFICE NOTE  History:  30 y.o. [id number] here today for abdominal pain and increased bleeding. She reports normal periods until this month. Then had heavy bleeding x 5 days, stopped for 2-3 days and then started bleeding heavily, still bleeding and on day 9. This is very abnormal for her. Also reports 2 days of abdominal pain, poor appetite. Currently sweating and unable to sit comfortably because of pain.     Past Medical History:  Diagnosis Date   Anxiety    Common migraine with intractable migraine 03/10/2018   Depression    Epigastric hernia    History of kidney stones    History of morbid obesity    Hx of pre-eclampsia in prior pregnancy, currently pregnant, first trimester    Hydronephrosis, left    Bilateral   Kidney stone    Stent placed in Feb 2017   PONV (postoperative nausea and vomiting)    nausea during last c section    Past Surgical History:  Procedure Laterality Date   CESAREAN SECTION     CESAREAN SECTION N/A 02/20/2016   Procedure: REPEAT CESAREAN SECTION;  Surgeon: Hermina Staggers, MD;  Location: [facility] BIRTHING SUITES;  Service: Obstetrics;  Laterality: N/A;   CESAREAN SECTION N/A 07/08/2018   Procedure: CESAREAN SECTION;  Surgeon: Allie Bossier, MD;  Location: [facility] LD ORS;  Service: Obstetrics;  Laterality: N/A;   CHOLECYSTECTOMY     CYSTOSCOPY W/ URETERAL STENT PLACEMENT Left 04/26/2018   Procedure: CYSTOSCOPY WITH RETROGRADE PYELOGRAM/URETERAL STENT PLACEMENT;  Surgeon: Crist Fat, MD;  Location: [facility] ORS;  Service: Urology;  Laterality: Left;   CYSTOSCOPY WITH STENT PLACEMENT     FINGER SURGERY     GASTRIC BYPASS     HERNIA REPAIR       Current Outpatient Medications:    etonogestrel (NEXPLANON) 68 MG IMPL implant, 1 each by Subdermal route once., Disp: , Rfl:    megestrol (MEGACE) 40 MG tablet, Take 1 tablet (40 mg total) by mouth daily. Can increase to twice a day if bleeding not improved within 48 hrs., Disp: 30 tablet, Rfl: 1   traMADol  HCl 100 MG TABS, Take by mouth., Disp: , Rfl:    VRAYLAR 1.5 MG capsule, Take 1.5 mg by mouth daily., Disp: , Rfl:    dicyclomine (BENTYL) 20 MG tablet, Take 1 tablet (20 mg total) by mouth 2 (two) times daily. (Patient not taking: Reported on 12/09/2020), Disp: 20 tablet, Rfl: 0   lidocaine (XYLOCAINE) 2 % jelly, Apply 1 application topically as needed. (Patient not taking: Reported on 12/09/2020), Disp: 30 mL, Rfl: 2   ondansetron (ZOFRAN) 4 MG tablet, Take 1 tablet (4 mg total) by mouth every 6 (six) hours. (Patient not taking: Reported on 12/09/2020), Disp: 12 tablet, Rfl: 0  The following portions of the patient's history were reviewed and updated as appropriate: allergies, current medications, past family history, past medical history, past social history, past surgical history and problem list.   Review of Systems:  Pertinent items noted in HPI and remainder of comprehensive ROS otherwise negative.   Objective:  Physical Exam Resp 16   Ht 5' (1.524 m)   Wt 176 lb (79.8 kg)   LMP 11/30/2020   BMI 34.37 kg/m  CONSTITUTIONAL: Well-developed, well-nourished female in significant distress with exam HENT:  Normocephalic, atraumatic. External right and left ear normal. Oropharynx is clear and moist EYES: Conjunctivae and EOM are normal. Pupils are equal, round, and reactive to light. No scleral icterus.  NECK: Normal range of motion, supple, no masses SKIN: Skin is warm and dry. No rash noted. Not diaphoretic. No erythema. No pallor. NEUROLOGIC: Alert and oriented to person, place, and time. Normal reflexes, muscle tone coordination. No cranial nerve deficit noted. PSYCHIATRIC: Normal mood and affect. Normal behavior. Normal judgment and thought content. CARDIOVASCULAR: Normal heart rate noted RESPIRATORY: Effort normal, no problems with respiration noted ABDOMEN: Soft, no distention noted.   PELVIC: Normal appearing external genitalia; normal appearing vaginal mucosa and cervix.  No  abnormal discharge noted.  pelvic cultures obtained. Normal uterine size, no other palpable masses, significant right sided tenderness. MUSCULOSKELETAL: Normal range of motion. No edema noted.  Exam done with chaperone present.  Labs and Imaging No results found.  Assessment & Plan:   1. Lower abdominal pain - Patient diaphoretic with exam, significant pelvic and lower abdominal pain with minimal palpation on abdomen - given significant, sudden onset pain x 2 days, h/o abdominal surgery (gastric bypass), recommend presenting to ED for urgent exam  2. Abnormal uterine bleeding (AUB) Start megace for acute stopppage of bleeding TVUS ordered  Will need f/u with MD once acute pain ruled out for urgent issues  Routine preventative health maintenance measures emphasized. Please refer to After Visit Summary for other counseling recommendations.   Return in about 4 weeks (around 01/06/2021).  Baldemar Lenis, MD, [facility] Attending Center for [facility] [facility])

## 2020-12-09 NOTE — Progress Notes (Signed)
Pt states periods have been longer, heavier and more painful recently.

## 2020-12-10 LAB — CERVICOVAGINAL ANCILLARY ONLY
Bacterial Vaginitis (gardnerella): NEGATIVE
Candida Glabrata: NEGATIVE
Candida Vaginitis: NEGATIVE
Chlamydia: NEGATIVE
Comment: NEGATIVE
Comment: NEGATIVE
Comment: NEGATIVE
Comment: NEGATIVE
Comment: NEGATIVE
Comment: NORMAL
Neisseria Gonorrhea: NEGATIVE
Trichomonas: NEGATIVE

## 2020-12-12 ENCOUNTER — Encounter: Payer: Self-pay | Admitting: Obstetrics and Gynecology

## 2020-12-12 ENCOUNTER — Ambulatory Visit: Payer: Medicaid Other | Admitting: Obstetrics and Gynecology

## 2020-12-12 ENCOUNTER — Other Ambulatory Visit: Payer: Self-pay

## 2020-12-12 VITALS — BP 122/65 | HR 68 | Resp 16 | Ht 60.0 in | Wt 176.0 lb

## 2020-12-12 DIAGNOSIS — N83201 Unspecified ovarian cyst, right side: Secondary | ICD-10-CM | POA: Diagnosis not present

## 2020-12-12 DIAGNOSIS — N83202 Unspecified ovarian cyst, left side: Secondary | ICD-10-CM

## 2020-12-12 DIAGNOSIS — Z720 Tobacco use: Secondary | ICD-10-CM | POA: Diagnosis not present

## 2020-12-12 DIAGNOSIS — R102 Pelvic and perineal pain: Secondary | ICD-10-CM | POA: Diagnosis not present

## 2020-12-12 IMAGING — US US RENAL
1 series · 15 of 25 positions shown · non-contrast
Comparison: 04/16/2018

CLINICAL DATA: Renal calculi, hydronephrosis supra, suprapubic
pain, urgency to void

EXAM:
RENAL / URINARY TRACT ULTRASOUND COMPLETE

[Series 1: us renal · 31 acquisitions, 15 frames shown]
[im 1/31]
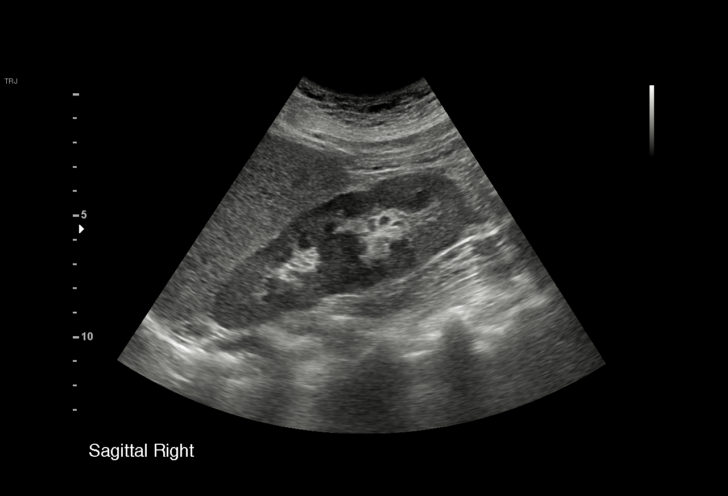
[im 3/31]
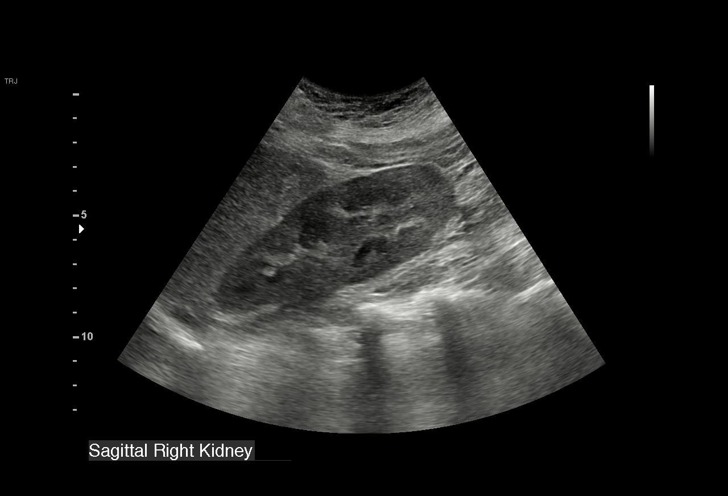
[im 6/31]
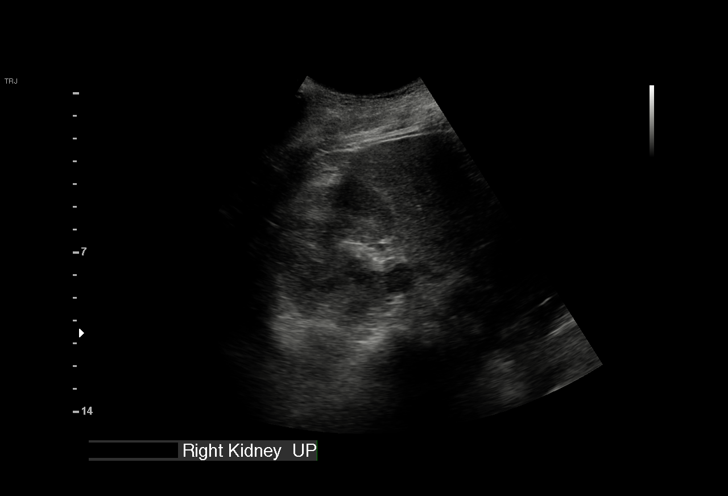
[im 7/31]
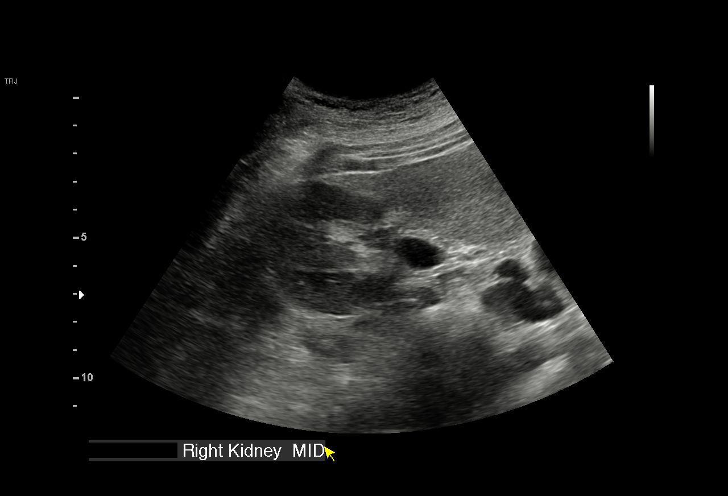
[im 9/31]
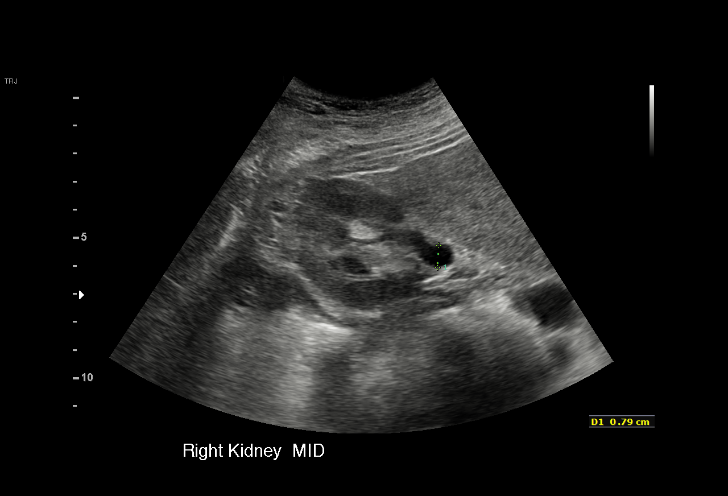
[im 12/31]
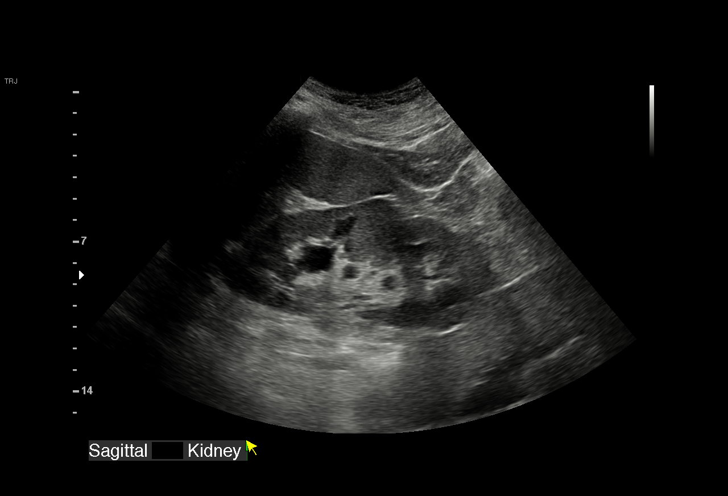
[im 13/31]
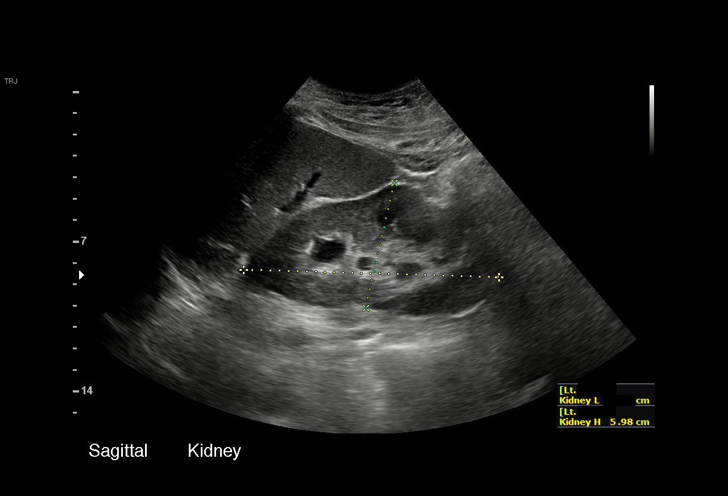
[im 16/31]
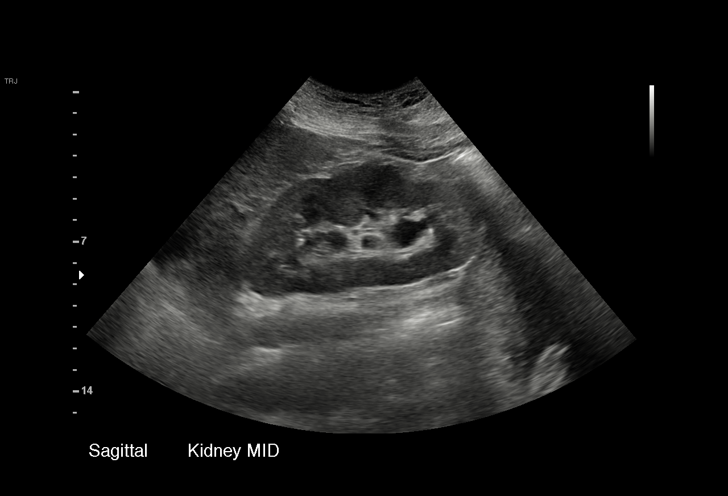
[im 18/31]
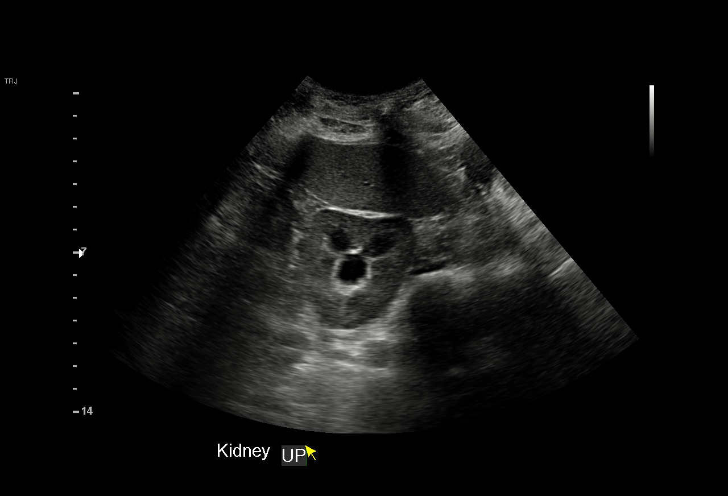
[im 19/31]
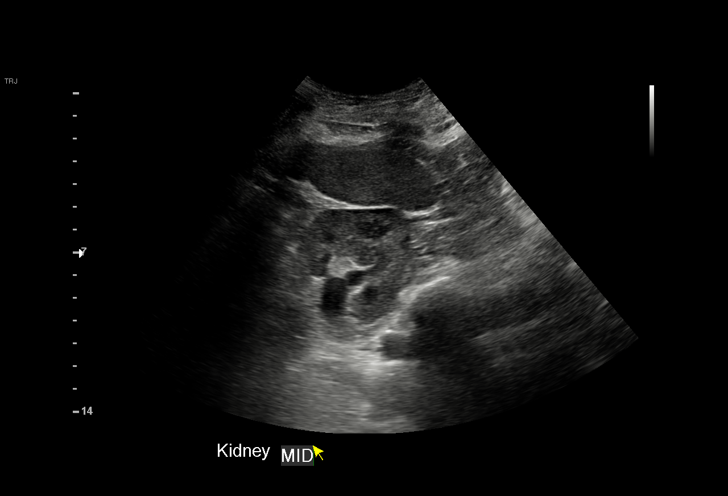
[im 22/31]
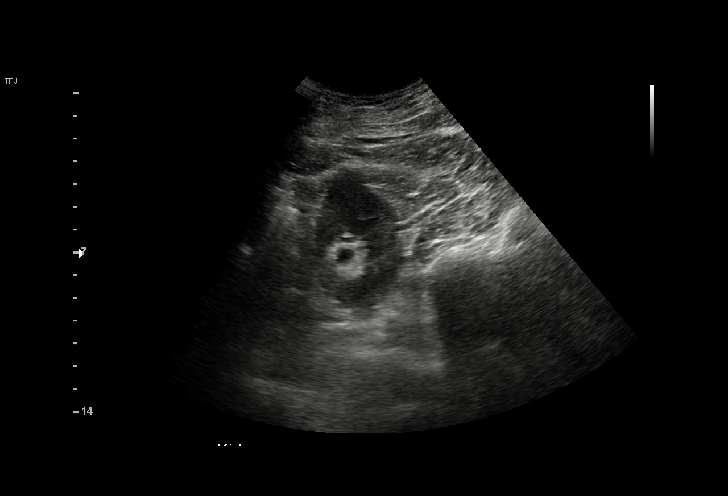
[im 24/31]
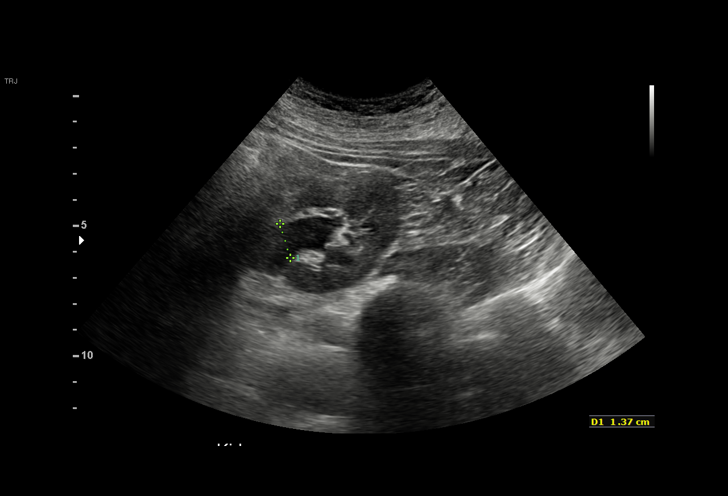
[im 26/31]
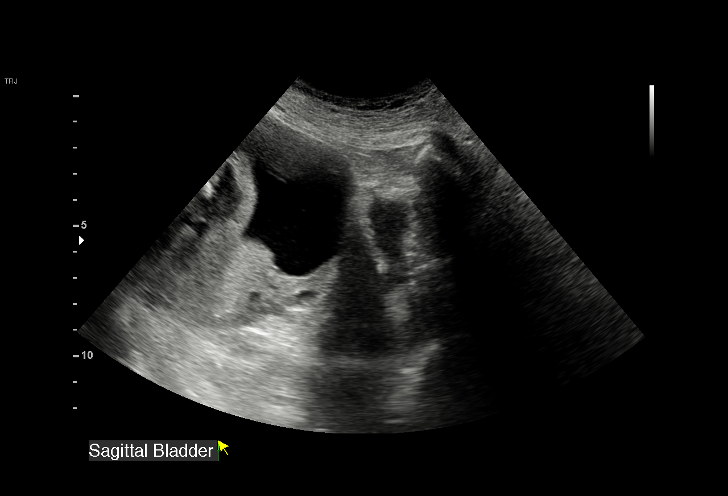
[im 28/31]
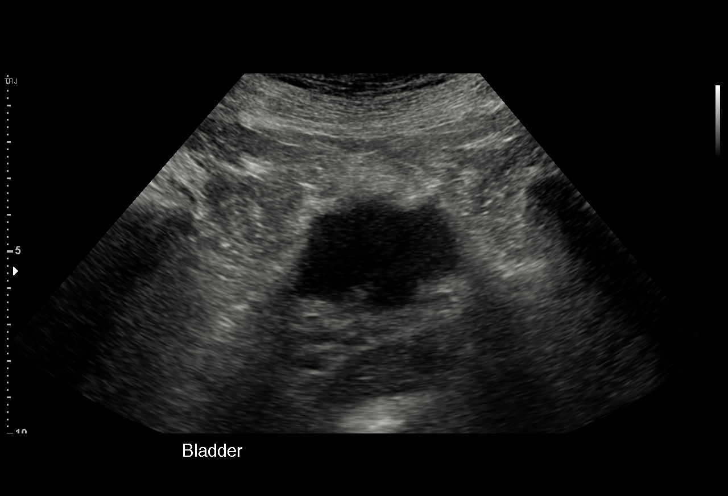
[im 31/31]
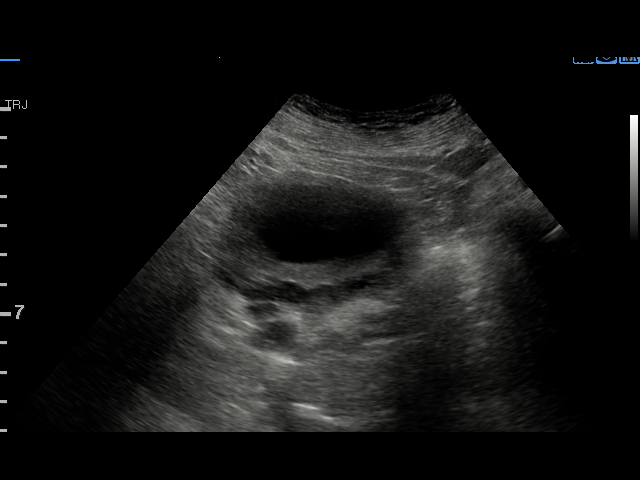

[15 of 25 positions shown; findings below may reference images not displayed]

FINDINGS: Right Kidney:

Renal measurements: 11.5 x 4.1 x 4.1 cm = volume: 101 mL. Normal
cortical thickness and echogenicity. Small RIGHT extrarenal pelvis.
No mass or hydronephrosis.

Left Kidney:

Renal measurements: 11.1 x 6.0 x 5.0 cm = volume: 186 mL. Normal
cortical thickness and echogenicity. Mild dilatation of LEFT renal
collecting system again identified, renal pelvis 15 mm transverse
previously 18 mm. No renal mass lesion. No definite shadowing
calcification. LEFT ureter was not followed.

Bladder:

Only partially distended, partially compressed by gravid uterus.
Ureteral jets were not visualized.
IMPRESSION: LEFT hydronephrosis again identified, renal pelvis slightly less
distended than on previous exam.

LEFT ureter could not be followed.

Bladder decompressed, suboptimally assessed.

Otherwise normal sonographic appearance of the kidneys.

## 2020-12-12 MED ORDER — CYCLOBENZAPRINE HCL 5 MG PO TABS: 5.0000 mg | ORAL_TABLET | Freq: Three times a day (TID) | ORAL | 0 refills | Status: DC | PRN

## 2020-12-12 NOTE — Progress Notes (Signed)
GYNECOLOGY OFFICE VISIT NOTE  History:   Gabrielle Roy is a 30 y.o. [phone] here today for f/u from 10/3 - she was having HVB soaking 2 pads an hour and also severe abdominal pain.   She went to [facility] ER for the pain - sent from the office to the ER. Found to have cysts on both sides - one is 5 cm and one is 3 cm.  She is doing improved from before but still in pain.  It is in her lower pelvis. She has a history of inguinal hernias and umbilical hernia. She had mesh replaced as well.   She has a nexplanon for birth control.     Past Medical History:  Diagnosis Date   Anxiety    Common migraine with intractable migraine 03/10/2018   Depression    Epigastric hernia    History of kidney stones    History of morbid obesity    Hx of pre-eclampsia in prior pregnancy, currently pregnant, first trimester    Hydronephrosis, left    Bilateral   Kidney stone    Stent placed in Feb 2017   PONV (postoperative nausea and vomiting)    nausea during last c section    Past Surgical History:  Procedure Laterality Date   CESAREAN SECTION     CESAREAN SECTION N/A 02/20/2016   Procedure: REPEAT CESAREAN SECTION;  Surgeon: Hermina Staggers, MD;  Location: [facility] BIRTHING SUITES;  Service: Obstetrics;  Laterality: N/A;   CESAREAN SECTION N/A 07/08/2018   Procedure: CESAREAN SECTION;  Surgeon: Allie Bossier, MD;  Location: [facility];  Service: Obstetrics;  Laterality: N/A;   CHOLECYSTECTOMY     CYSTOSCOPY W/ URETERAL STENT PLACEMENT Left 04/26/2018   Procedure: CYSTOSCOPY WITH RETROGRADE PYELOGRAM/URETERAL STENT PLACEMENT;  Surgeon: Crist Fat, MD;  Location: [facility] ORS;  Service: Urology;  Laterality: Left;   CYSTOSCOPY WITH STENT PLACEMENT     FINGER SURGERY     GASTRIC BYPASS     HERNIA REPAIR      The following portions of the patient's history were reviewed and updated as appropriate: allergies, current medications, past family history, past medical history, past social history, past  surgical history and problem list.   Health Maintenance:  Normal pap 12/2017.   Review of Systems:  Pertinent items noted in HPI and remainder of comprehensive ROS otherwise negative.  Physical Exam:  BP 122/65   Pulse 68   Resp 16   Ht 5' (1.524 m)   Wt 176 lb (79.8 kg)   LMP 11/30/2020   BMI 34.37 kg/m  CONSTITUTIONAL: Well-developed, well-nourished female in no acute distress.  HEENT:  Normocephalic, atraumatic. External right and left ear normal. No scleral icterus.  NECK: Normal range of motion, supple, no masses noted on observation SKIN: No rash noted. Not diaphoretic. No erythema. No pallor. MUSCULOSKELETAL: Normal range of motion. No edema noted. NEUROLOGIC: Alert and oriented to person, place, and time. Normal muscle tone coordination. No cranial nerve deficit noted. PSYCHIATRIC: Normal mood and affect. Normal behavior. Normal judgment and thought content.  CARDIOVASCULAR: Normal heart rate noted RESPIRATORY: Effort and breath sounds normal, no problems with respiration noted ABDOMEN: No masses noted. No other overt distention noted.    PELVIC: Deferred  Labs and Imaging Results for orders placed or performed in visit on 12/09/20 (from the past 168 hour(s))  Cervicovaginal ancillary only   Collection Time: 12/09/20  3:49 PM  Result Value Ref Range   Neisseria Gonorrhea Negative  Chlamydia Negative    Trichomonas Negative    Bacterial Vaginitis (gardnerella) Negative    Candida Vaginitis Negative    Candida Glabrata Negative    Comment      Normal Reference Range Bacterial Vaginosis - Negative   Comment Normal Reference Range Candida Species - Negative    Comment Normal Reference Range Candida Galbrata - Negative    Comment Normal Reference Range Trichomonas - Negative    Comment Normal Reference Ranger Chlamydia - Negative    Comment      Normal Reference Range Neisseria Gonorrhea - Negative   I reviewed the reports for imaging and lab results from [facility] from 10/4.  Assessment and Plan:    1. Bilateral ovarian cysts - Cysts are bilateral and 3 and 5 cm. Complexity is indeterminant but likely simple. There is no nodularity or solid component.  - Recommend repeat US in 6-8 wks - Continue Ibuprofen/tylenol for pain. Will also send in flexeril to see if this helps.  - Discussed possible need for Dx laparoscopy but I do think she should follow up with her general surgeon - her presentation is unusual for simple cysts to be responsible for this level of pain. We will call her general surgeon's office to see if they can get her in asap.    Routine preventative health maintenance measures emphasized. Please refer to After Visit Summary for other counseling recommendations.   Return in about 3 months (around 03/14/2021) for annual and follow up.    I spent  26  minutes dedicated to the care of this patient including pre-visit review of records, face to face time with the patient discussing her conditions and treatments and post visit orders.    Milas Hock, MD, FACOG Obstetrician & Gynecologist, [facility] for [facility], [facility]

## 2021-05-26 ENCOUNTER — Encounter: Payer: Self-pay | Admitting: Obstetrics & Gynecology

## 2021-05-26 ENCOUNTER — Other Ambulatory Visit: Payer: Self-pay

## 2021-05-26 ENCOUNTER — Ambulatory Visit: Payer: Medicaid Other | Admitting: Obstetrics & Gynecology

## 2021-05-26 ENCOUNTER — Other Ambulatory Visit (HOSPITAL_COMMUNITY)
Admission: RE | Admit: 2021-05-26 | Discharge: 2021-05-26 | Disposition: A | Discharge status: Home / Self Care | Payer: Medicaid Other | Source: Ambulatory Visit | Attending: Obstetrics & Gynecology | Admitting: Obstetrics & Gynecology

## 2021-05-26 VITALS — BP 125/84 | HR 86 | Ht 60.0 in | Wt 170.0 lb

## 2021-05-26 DIAGNOSIS — N939 Abnormal uterine and vaginal bleeding, unspecified: Secondary | ICD-10-CM

## 2021-05-26 DIAGNOSIS — Z124 Encounter for screening for malignant neoplasm of cervix: Secondary | ICD-10-CM | POA: Diagnosis not present

## 2021-05-26 DIAGNOSIS — R102 Pelvic and perineal pain: Secondary | ICD-10-CM | POA: Diagnosis not present

## 2021-05-26 LAB — POCT URINE PREGNANCY: Preg Test, Ur: NEGATIVE

## 2021-05-26 NOTE — Progress Notes (Signed)
? ?GYNECOLOGY OFFICE VISIT NOTE ? ?History:  ? Gabrielle Roy is a 31 y.o. [phone] here today for evaluation of pelvic pain (R side more than L) and episode of abnormal bleeding that started last week and worsened today.  Pain is moderate to severe, intermittent and associated with pelvic pressure and low back pain.  Also having spotting which is outside her usual period. Pain was intense enough to require prescription of Oxycodone from her PCP. Denies any fevers, chills, sweats, dysuria, nausea, vomiting, other GI or GU symptoms or other general symptoms..  ?  ?Past Medical History:  ?Diagnosis Date  ? Anxiety   ? Common migraine with intractable migraine 03/10/2018  ? Depression   ? Epigastric hernia   ? History of kidney stones   ? History of morbid obesity   ? Hx of pre-eclampsia in prior pregnancy, currently pregnant, first trimester   ? Hydronephrosis, left   ? Bilateral  ? Kidney stone   ? Stent placed in Feb 2017  ? PONV (postoperative nausea and vomiting)   ? nausea during last c section  ? ? ?Past Surgical History:  ?Procedure Laterality Date  ? CESAREAN SECTION    ? CESAREAN SECTION N/A 02/20/2016  ? Procedure: REPEAT CESAREAN SECTION;  Surgeon: Hermina Staggers, MD;  Location: [facility] BIRTHING SUITES;  Service: Obstetrics;  Laterality: N/A;  ? CESAREAN SECTION N/A 07/08/2018  ? Procedure: CESAREAN SECTION;  Surgeon: Allie Bossier, MD;  [facility]: [facility];  Service: Obstetrics;  Laterality: N/A;  ? CHOLECYSTECTOMY    ? CYSTOSCOPY W/ URETERAL STENT PLACEMENT Left 04/26/2018  ? Procedure: CYSTOSCOPY WITH RETROGRADE PYELOGRAM/URETERAL STENT PLACEMENT;  Surgeon: Crist Fat, MD;  Location: [facility];  Service: Urology;  Laterality: Left;  ? CYSTOSCOPY WITH STENT PLACEMENT    ? FINGER SURGERY    ? GASTRIC BYPASS    ? HERNIA REPAIR    ? ? ?The following portions of the patient's history were reviewed and updated as appropriate: allergies, current medications, past family history, past medical history, past social  history, past surgical history and problem list.  ? ?Health Maintenance:  Normal pap on 12/29/2017. ? ?Review of Systems:  ?Pertinent items noted in HPI and remainder of comprehensive ROS otherwise negative. ? ?Physical Exam:  ?BP 125/84   Pulse 86   Ht 5' (1.524 m)   Wt 170 lb (77.1 kg)   LMP 05/12/2021   BMI 33.20 kg/m?  ?CONSTITUTIONAL: Well-developed, well-nourished female in no acute distress.  ?HEENT:  Normocephalic, atraumatic. External right and left ear normal. No scleral icterus.  ?NECK: Normal range of motion, supple, no masses noted on observation ?SKIN: No rash noted. Not diaphoretic. No erythema. No pallor. ?MUSCULOSKELETAL: Normal range of motion. No edema noted. ?NEUROLOGIC: Alert and oriented to person, place, and time. Normal muscle tone coordination. No cranial nerve deficit noted. ?PSYCHIATRIC: Normal mood and affect. Normal behavior. Normal judgment and thought content. ?CARDIOVASCULAR: Normal heart rate noted ?RESPIRATORY: Effort and breath sounds normal, no problems with respiration noted ?ABDOMEN: Moderate RLQ TTP, no rebound or guarding, mild suprapubic and LLQ TTP.  No masses noted. No other overt distention noted.   ?PELVIC: Normal appearing external genitalia; normal urethral meatus; normal appearing vaginal mucosa and cervix.  Pap smear done. Bloody discharge noted, testing sample obtained. Normal uterine size, no other palpable masses, no uterine or adnexal tenderness. Performed in the presence of a chaperone ? ?Labs and Imaging ?Results for orders placed or performed in visit on 05/26/21 (from  the past 168 hour(s))  ?POCT urine pregnancy  ? Collection Time: 05/26/21  2:57 PM  ?Result Value Ref Range  ? Preg Test, Ur Negative Negative  ?    ?Assessment and Plan:  ?   ?1. Pap smear for cervical cancer screening ?- Cytology - PAP( [place]) done, will follow up results and manage accordingly. ? ?2. Pelvic pain ?3. Abnormal uterine bleeding ?Negative pregnancy test.  Possible  ovarian cyst, will obtain pelvic ultrasound. Will also evaluate for possible infection/inflammation.  Recommended to add Tylenol to prescribed Oxycodone for pain, she is unable to take NSAIDs ?- US PELVIC COMPLETE WITH TRANSVAGINAL; Future ?- Cervicovaginal ancillary only( [place]) ?- POCT urine pregnancy ?Routine preventative health maintenance measures emphasized. ?Please refer to After Visit Summary for other counseling recommendations.  ? ?Return for any gynecologic concerns.   ? ?I spent 20 minutes dedicated to the care of this patient including pre-visit review of records, face to face time with the patient discussing her conditions and treatments and post visit orders. ? ? ? ?Jaynie Collins, MD, FACOG ?Obstetrician Heritage manager, Faculty Practice ?Center for [employer], [facility] ? ? ? ? ? ? ?

## 2021-05-27 ENCOUNTER — Ambulatory Visit (INDEPENDENT_AMBULATORY_CARE_PROVIDER_SITE_OTHER): Payer: Medicaid Other

## 2021-05-27 DIAGNOSIS — R102 Pelvic and perineal pain: Secondary | ICD-10-CM

## 2021-05-27 LAB — CERVICOVAGINAL ANCILLARY ONLY
Bacterial Vaginitis (gardnerella): NEGATIVE
Candida Glabrata: NEGATIVE
Candida Vaginitis: NEGATIVE
Chlamydia: NEGATIVE
Comment: NEGATIVE
Comment: NEGATIVE
Comment: NEGATIVE
Comment: NEGATIVE
Comment: NEGATIVE
Comment: NORMAL
Neisseria Gonorrhea: NEGATIVE
Trichomonas: NEGATIVE

## 2021-05-30 LAB — CYTOLOGY - PAP: Adequacy: ABNORMAL

## 2021-08-08 ENCOUNTER — Other Ambulatory Visit (HOSPITAL_COMMUNITY)
Admission: RE | Admit: 2021-08-08 | Discharge: 2021-08-08 | Disposition: A | Discharge status: Home / Self Care | Payer: Medicaid Other | Source: Ambulatory Visit | Attending: Obstetrics and Gynecology | Admitting: Obstetrics and Gynecology

## 2021-08-08 ENCOUNTER — Ambulatory Visit: Payer: Medicaid Other | Admitting: Obstetrics and Gynecology

## 2021-08-08 ENCOUNTER — Encounter: Payer: Self-pay | Admitting: Obstetrics and Gynecology

## 2021-08-08 VITALS — BP 126/81 | HR 82 | Ht 60.0 in | Wt 176.0 lb

## 2021-08-08 DIAGNOSIS — Z01419 Encounter for gynecological examination (general) (routine) without abnormal findings: Secondary | ICD-10-CM

## 2021-08-08 DIAGNOSIS — L0291 Cutaneous abscess, unspecified: Secondary | ICD-10-CM

## 2021-08-08 DIAGNOSIS — N83209 Unspecified ovarian cyst, unspecified side: Secondary | ICD-10-CM

## 2021-08-08 MED ORDER — CEFTRIAXONE SODIUM 1 G IJ SOLR
1.0000 g | Freq: Once | INTRAMUSCULAR | Status: AC
  Administered 2021-08-08: 1 g via INTRAMUSCULAR

## 2021-08-08 MED ORDER — HYDROCODONE-ACETAMINOPHEN 5-325 MG PO TABS: 1.0000 | ORAL_TABLET | Freq: Four times a day (QID) | ORAL | 0 refills | Status: DC | PRN

## 2021-08-08 MED ORDER — SULFAMETHOXAZOLE-TRIMETHOPRIM 800-160 MG PO TABS: 1.0000 | ORAL_TABLET | Freq: Two times a day (BID) | ORAL | 0 refills | Status: AC

## 2021-08-08 NOTE — Progress Notes (Signed)
Pt here for annual- will schedule nexplanon removal and replacement for September Pt has abscess under armpit

## 2021-08-08 NOTE — Progress Notes (Signed)
GYNECOLOGY ANNUAL PREVENTATIVE CARE ENCOUNTER NOTE  History:     Gabrielle Roy is a 31 y.o. [phone] female here for a routine annual gynecologic exam.  Current complaints: axilla abscess with significant pain.   Denies abnormal vaginal bleeding, discharge, pelvic pain, problems with intercourse or other gynecologic concerns.    Gynecologic History Patient's last menstrual period was 07/18/2021. Contraception: Nexplanon Last Pap: 2019. Result was normal with negative HPV  Obstetric History OB History  Gravida Para Term Preterm AB Living  3 3 2 1   3   SAB IAB Ectopic Multiple Live Births        0 3    # Outcome Date GA Lbr Len/2nd Weight Sex Delivery Anes PTL Lv  3 Term 07/08/18 [redacted]w[redacted]d  7 lb (3.175 kg) F CS-LTranv Spinal  LIV  2 Term 02/20/16 [redacted]w[redacted]d  8 lb 14.5 oz (4.04 kg) F CS-LTranv Spinal  LIV  1 Preterm 07/20/12 [redacted]w[redacted]d  5 lb (2.268 kg) F CS-LTranv  N LIV     Complications: Fetal Intolerance, Oligohydramnios antepartum    Past Medical History:  Diagnosis Date   Anxiety    Common migraine with intractable migraine 03/10/2018   Depression    Epigastric hernia    History of kidney stones    History of morbid obesity    Hx of pre-eclampsia in prior pregnancy, currently pregnant, first trimester    Hydronephrosis, left    Bilateral   Kidney stone    Stent placed in Feb 2017   PONV (postoperative nausea and vomiting)    nausea during last c section    Past Surgical History:  Procedure Laterality Date   CESAREAN SECTION     CESAREAN SECTION N/A 02/20/2016   Procedure: REPEAT CESAREAN SECTION;  Surgeon: 02/22/2016, MD;  Location: [facility] BIRTHING SUITES;  Service: Obstetrics;  Laterality: N/A;   CESAREAN SECTION N/A 07/08/2018   Procedure: CESAREAN SECTION;  Surgeon: 09/07/2018, MD;  Location: [facility] LD [facility]S;  Service: Obstetrics;  Laterality: N/A;   CHOLECYSTECTOMY     CYSTOSCOPY W/ URETERAL STENT PLACEMENT Left 04/26/2018   Procedure: CYSTOSCOPY WITH RETROGRADE  PYELOGRAM/URETERAL STENT PLACEMENT;  Surgeon: 04/28/2018, MD;  Location: [facility];  Service: Urology;  Laterality: Left;   CYSTOSCOPY WITH STENT PLACEMENT     FINGER SURGERY     GASTRIC BYPASS     HERNIA REPAIR      Current Outpatient Medications on File Prior to Visit  Medication Sig Dispense Refill   amphetamine-dextroamphetamine (ADDERALL XR) 15 MG 24 hr capsule Take by mouth.     busPIRone (BUSPAR) 7.5 MG tablet Take 7.5 mg by mouth 3 (three) times daily.     etonogestrel (NEXPLANON) 68 MG IMPL implant 1 each by Subdermal route once.     LATUDA 20 MG TABS tablet SMARTSIG:1.5 Tablet(s) By Mouth Every Evening     cyclobenzaprine (FLEXERIL) 5 MG tablet Take 1 tablet (5 mg total) by mouth 3 (three) times daily as needed for muscle spasms. (Patient not taking: Reported on 05/26/2021) 30 tablet 0   megestrol (MEGACE) 40 MG tablet Take 1 tablet (40 mg total) by mouth daily. Can increase to twice a day if bleeding not improved within 48 hrs. (Patient not taking: Reported on 05/26/2021) 30 tablet 1   traMADol (ULTRAM) 50 MG tablet Take 50-100 mg by mouth every 6 (six) hours as needed. (Patient not taking: Reported on 05/26/2021)     traMADol HCl 100 MG TABS Take by  mouth. (Patient not taking: Reported on 05/26/2021)     VRAYLAR 1.5 MG capsule Take 1.5 mg by mouth daily. (Patient not taking: Reported on 05/26/2021)     No current facility-administered medications on file prior to visit.    Allergies  Allergen Reactions   Alcohol Hives    Allergic to alcohol containing products including cough medicines that contain alcohol Allergic to alcohol containing products including cough medicines that contain alcohol Allergic to alcohol containing products including cough medicines that contain alcohol Allergic to alcohol containing products including cough medicines that contain alcohol Allergic to alcohol containing products including cough medicines that contain alcohol Allergic to alcohol  containing products including cough medicines that contain alcohol Allergic to alcohol containing products including cough medicines that contain alcohol Allergic to alcohol containing products including cough medicines that contain alcohol Allergic to alcohol containing products including cough medicines that contain alcohol   Dm-Doxylamine-Acetaminophen Shortness Of Breath   Hydrocodone Nausea And Vomiting   Hydrocodone-Acetaminophen Nausea And Vomiting   Hydrocodone-Acetaminophen Nausea And Vomiting    If takes with food -can take 03/24/21   Metronidazole Other (See Comments)    Facial flushing Facial flushing Facial flushing Facial flushing Facial flushing Facial flushing Facial flushing Facial flushing Facial flushing   Nsaids Other (See Comments)    Hx of Gastric Bypass.    Social History:  reports that she has been smoking cigarettes. She has been smoking an average of .5 packs per day. She has never used smokeless tobacco. She reports that she does not drink alcohol and does not use drugs.  Family History  Problem Relation Age of Onset   Hypertension Father    Heart disease Father    Hearing loss Father     The following portions of the patient's history were reviewed and updated as appropriate: allergies, current medications, past family history, past medical history, past social history, past surgical history and problem list.  Review of Systems Pertinent items noted in HPI and remainder of comprehensive ROS otherwise negative.  Physical Exam:  BP 126/81   Pulse 82   Ht 5' (1.524 m)   Wt 176 lb (79.8 kg)   LMP 07/18/2021   BMI 34.37 kg/m  CONSTITUTIONAL: Well-developed, well-nourished female in no acute distress.  HENT:  Normocephalic, atraumatic, External right and left ear normal.  EYES: Conjunctivae and EOM are normal. Pupils are equal, round, and reactive to light. No scleral icterus.  NECK: Normal range of motion, supple, no masses.  Normal thyroid.   SKIN: Skin is warm and dry. No rash noted. Not diaphoretic. No erythema. No pallor. MUSCULOSKELETAL: Normal range of motion. No tenderness.  No cyanosis, clubbing, or edema. NEUROLOGIC: Alert and oriented to person, place, and time. Normal reflexes, muscle tone coordination.  PSYCHIATRIC: Normal mood and affect. Normal behavior. Normal judgment and thought content. CARDIOVASCULAR: Normal heart rate noted, regular rhythm RESPIRATORY: Clear to auscultation bilaterally. Effort and breath sounds normal, no problems with respiration noted. BREASTS: Symmetric in size. Left axilla abscess with large surrounding cellulitis.  Performed in the presence of a chaperone. ABDOMEN: Soft, no distention noted.  No tenderness, rebound or guarding.  PELVIC: Normal appearing external genitalia and urethral meatus; normal appearing vaginal mucosa and cervix.  No abnormal vaginal discharge noted.  Pap smear obtained.  Normal uterine size, no other palpable masses, no uterine or adnexal tenderness.  Performed in the presence of a chaperone.   Assessment and Plan:   1. Well woman exam  - Cytology - PAP(  [place])  2. Abscess  - Recommend ED today. She is unable to go d/t childcare for her children, however will go if symptoms worsen.  - 1 gram IM rocephin given in office - Rx: Bactrim, Vicodin, continue warm compress.  - Ambulatory referral to General Surgery- Urgent referral. She has been seen in this office in the past.   3. Cyst of ovary, unspecified laterality  Nexplanon expires in Sept. Discussed starting estrogen to decrease the likely hood of recurrent ovarian cysts.   Will follow up results of pap smear and manage accordingly. Routine preventative health maintenance measures emphasized. Please refer to After Visit Summary for other counseling recommendations.   Devorah Givhan, Harolyn Rutherford, NP Faculty Practice Center for [employer], [facility]

## 2021-08-11 LAB — CYTOLOGY - PAP: Diagnosis: NEGATIVE

## 2021-09-22 NOTE — Progress Notes (Signed)
GYNECOLOGY OFFICE VISIT NOTE  History:   Gabrielle Roy is a 31 y.o. [phone] here today for Nexplanon removal.  She would like UPT and if negative she would like to switch to pills. She had multiple positives at home. Would feel better with blood work.   Prior cysts resolved by [place]. She is still in pain. Would like to check another [place].   She denies any abnormal vaginal discharge, bleeding, pelvic pain or other concerns.     Past Medical History:  Diagnosis Date   Anxiety    Common migraine with intractable migraine 03/10/2018   Depression    Epigastric hernia    History of kidney stones    History of morbid obesity    Hx of pre-eclampsia in prior pregnancy, currently pregnant, first trimester    Hydronephrosis, left    Bilateral   Kidney stone    Stent placed in Feb 2017   PONV (postoperative nausea and vomiting)    nausea during last c section    Past Surgical History:  Procedure Laterality Date   CESAREAN SECTION     CESAREAN SECTION N/A 02/20/2016   Procedure: REPEAT CESAREAN SECTION;  Surgeon: Hermina Staggers, MD;  Location: [facility] BIRTHING SUITES;  Service: Obstetrics;  Laterality: N/A;   CESAREAN SECTION N/A 07/08/2018   Procedure: CESAREAN SECTION;  Surgeon: Allie Bossier, MD;  Location: [facility];  Service: Obstetrics;  Laterality: N/A;   CHOLECYSTECTOMY     CYSTOSCOPY W/ URETERAL STENT PLACEMENT Left 04/26/2018   Procedure: CYSTOSCOPY WITH RETROGRADE PYELOGRAM/URETERAL STENT PLACEMENT;  Surgeon: Crist Fat, MD;  Location: [facility];  Service: Urology;  Laterality: Left;   CYSTOSCOPY WITH STENT PLACEMENT     FINGER SURGERY     GASTRIC BYPASS     HERNIA REPAIR      The following portions of the patient's history were reviewed and updated as appropriate: allergies, current medications, past family history, past medical history, past social history, past surgical history and problem list.   Health Maintenance:   Normal pap  Diagnosis  Date Value Ref Range Status   08/08/2021   Final   - Negative for intraepithelial lesion or malignancy (NILM)     Review of Systems:  Pertinent items noted in HPI and remainder of comprehensive ROS otherwise negative.  Physical Exam:  BP 125/83   Pulse (!) 132   Ht 5' (1.524 m)   Wt 179 lb (81.2 kg)   BMI 34.96 kg/m  CONSTITUTIONAL: Well-developed, well-nourished female in no acute distress.  HEENT:  Normocephalic, atraumatic. External right and left ear normal. No scleral icterus.  NECK: Normal range of motion, supple, no masses noted on observation SKIN: No rash noted. Not diaphoretic. No erythema. No pallor. MUSCULOSKELETAL: Normal range of motion. No edema noted. NEUROLOGIC: Alert and oriented to person, place, and time. Normal muscle tone coordination. No cranial nerve deficit noted. PSYCHIATRIC: Normal mood and affect. Normal behavior. Normal judgment and thought content.  CARDIOVASCULAR: Normal heart rate noted RESPIRATORY: Effort and breath sounds normal, no problems with respiration noted ABDOMEN: No masses noted. No other overt distention noted.    PELVIC: Deferred  GYNECOLOGY OFFICE PROCEDURE NOTE  Nexplanon Removal Patient identified, informed consent performed, consent signed.   Appropriate time out taken.   Nexplanon site identified.  Area prepped in usual sterile fashon. One ml of 1% lidocaine was used to anesthetize the area at the distal end of the implant. A small stab incision was made right beside  the implant on the distal portion.  The Nexplanon rod was grasped using hemostats and removed without difficulty.  There was minimal blood loss. There were no complications.  3 ml of 1% lidocaine was injected around the incision for post-procedure analgesia.  Steri-strips were applied over the small incision.  A pressure bandage was applied to reduce any bruising.    The patient tolerated the procedure well and was given post procedure instructions.  Patient is planning to "use OCPs for  contraception.  Assessment and Plan:  Gabrielle Roy was seen today for nexplanon removal.  Diagnoses and all orders for this visit:  Nexplanon removal Removed without issue. Start OCPs if HCG is neg.   Initiation of OCP (BCP) - No contraindications ot OCP -     B-HCG Quant -     norethindrone-ethinyl estradiol-FE (JUNEL FE 1/20) 1-20 MG-MCG tablet; Take 1 tablet by mouth daily.  Possible pregnancy -     POCT urine pregnancy -     B-HCG Quant  Pelvic pain -     US PELVIC COMPLETE WITH TRANSVAGINAL; Future - Reviewed possible bowel etiology. She will go for [place] if HCG is negative and still in pain after a month of OCPs.    Routine preventative health maintenance measures emphasized. Please refer to After Visit Summary for other counseling recommendations.   No follow-ups on file.  Milas Hock, MD, FACOG Obstetrician & Gynecologist, [facility] for [facility], [facility]

## 2021-09-25 ENCOUNTER — Ambulatory Visit: Payer: Medicaid Other | Admitting: Obstetrics and Gynecology

## 2021-09-25 ENCOUNTER — Encounter: Payer: Self-pay | Admitting: Obstetrics and Gynecology

## 2021-09-25 VITALS — BP 125/83 | HR 132 | Ht 60.0 in | Wt 179.0 lb

## 2021-09-25 DIAGNOSIS — R102 Pelvic and perineal pain: Secondary | ICD-10-CM

## 2021-09-25 DIAGNOSIS — Z3202 Encounter for pregnancy test, result negative: Secondary | ICD-10-CM | POA: Diagnosis not present

## 2021-09-25 DIAGNOSIS — Z30011 Encounter for initial prescription of contraceptive pills: Secondary | ICD-10-CM

## 2021-09-25 DIAGNOSIS — Z3046 Encounter for surveillance of implantable subdermal contraceptive: Secondary | ICD-10-CM | POA: Diagnosis not present

## 2021-09-25 DIAGNOSIS — Z3200 Encounter for pregnancy test, result unknown: Secondary | ICD-10-CM

## 2021-09-25 LAB — POCT URINE PREGNANCY: Preg Test, Ur: NEGATIVE

## 2021-09-25 MED ORDER — NORETHIN ACE-ETH ESTRAD-FE 1-20 MG-MCG PO TABS: 1.0000 | ORAL_TABLET | Freq: Every day | ORAL | 3 refills | Status: DC

## 2021-09-25 NOTE — Progress Notes (Signed)
Pt has had 3 positive UPTs and some negative UPTs

## 2021-09-26 LAB — HCG, QUANTITATIVE, PREGNANCY: HCG, Total, QN: <5 m[IU]/mL

## 2021-09-29 ENCOUNTER — Encounter: Payer: Self-pay | Admitting: Obstetrics and Gynecology

## 2021-09-29 DIAGNOSIS — N946 Dysmenorrhea, unspecified: Secondary | ICD-10-CM

## 2021-09-30 ENCOUNTER — Ambulatory Visit: Payer: Medicaid Other | Admitting: Obstetrics and Gynecology

## 2021-09-30 MED ORDER — CYCLOBENZAPRINE HCL 10 MG PO TABS: 10.0000 mg | ORAL_TABLET | Freq: Three times a day (TID) | ORAL | 0 refills | Status: AC | PRN

## 2021-10-17 ENCOUNTER — Ambulatory Visit (INDEPENDENT_AMBULATORY_CARE_PROVIDER_SITE_OTHER): Payer: Medicaid Other

## 2021-10-17 DIAGNOSIS — R102 Pelvic and perineal pain: Secondary | ICD-10-CM

## 2021-12-12 ENCOUNTER — Ambulatory Visit (INDEPENDENT_AMBULATORY_CARE_PROVIDER_SITE_OTHER): Payer: Medicaid Other | Admitting: *Deleted

## 2021-12-12 DIAGNOSIS — Z3201 Encounter for pregnancy test, result positive: Secondary | ICD-10-CM | POA: Diagnosis not present

## 2021-12-12 DIAGNOSIS — Z3200 Encounter for pregnancy test, result unknown: Secondary | ICD-10-CM

## 2021-12-12 LAB — POCT PREGNANCY, URINE: Preg Test, Ur: POSITIVE — AB

## 2021-12-12 NOTE — Progress Notes (Signed)
Pt submitted urine specimen for pregnancy test and agreed to be called with test results. I called her and informed of positive result.  She reports LMP 10/19/21 however she only had one Parsa Rickett of spotting - no period since then. Pt stated that she is unsure if she could have gotten pregnant earlier - possibly in July. I noted that pt had BHCG in our office on 7/20 with result of <5 which was the same Emeril Stille her Nexplanon was removed. I assured her that she was not pregnant @ that time. Based on LMP 10/19/21, pt is [redacted]w[redacted]d with EDD 07/26/22. Pt denies vaginal bleeding and does not have abdominal pain. She was advised to go to [facility] if she develops either of these issues. She will be scheduled for New Ob appts within the next several weeks. Pt voiced understanding of all information and instructions given.

## 2021-12-24 ENCOUNTER — Telehealth: Payer: Medicaid Other

## 2022-01-07 ENCOUNTER — Telehealth (INDEPENDENT_AMBULATORY_CARE_PROVIDER_SITE_OTHER): Payer: Medicaid Other

## 2022-01-07 DIAGNOSIS — O0990 Supervision of high risk pregnancy, unspecified, unspecified trimester: Secondary | ICD-10-CM | POA: Insufficient documentation

## 2022-01-07 DIAGNOSIS — Z3689 Encounter for other specified antenatal screening: Secondary | ICD-10-CM

## 2022-01-07 NOTE — Progress Notes (Signed)
New OB Intake  I connected with Gabrielle Roy on 01/07/22 at  1:15 PM EDT by MyChart Video Visit and verified that I am speaking with the correct person using two identifiers. Nurse is located at [facility] and pt is located at home.  I discussed the limitations, risks, security and privacy concerns of performing an evaluation and management service by telephone and the availability of in person appointments. I also discussed with the patient that there may be a patient responsible charge related to this service. The patient expressed understanding and agreed to proceed.  I explained I am completing New OB Intake today. We discussed EDD of 07/26/2022 that is based on LMP of 10/19/2021. Pt is G4/P2103. I reviewed her allergies, medications, Medical/Surgical/OB history, and appropriate screenings. I informed her of [facility] services. [facility] information placed in AVS. Based on history, this is a high risk pregnancy.  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   Former smoker 05/06/2017   Anxiety state 09/29/2013   Depressive disorder, not elsewhere classified 09/29/2013   Gastroesophageal reflux disease without esophagitis 08/16/2013    Concerns addressed today  Patient Active Problem List   Diagnosis Date Noted   Supervision of high risk pregnancy, antepartum 01/07/2022   Hydronephrosis, left 08/10/2018   Common migraine with intractable migraine 03/10/2018   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   Postoperative intestinal malabsorption 06/07/2017   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 Patient given information for [facility] Healthy Baby website for more information about Women[facility] and [facility]. Patient is not interested in water birth. Offered upcoming OB visit with CNM to discuss further.  MyChart/Babyscripts MyChart access verified. I explained pt will have some visits in office and some virtually. Babyscripts instructions given and order placed. Patient verifies receipt of registration text/e-mail. Account successfully created and app downloaded.  Blood Pressure Cuff/Weight Scale Patient has her own blood pressure device.   Anatomy US Explained first scheduled [place] will be around 19 weeks. Anatomy US scheduled for 02/27/2022 at 10:15 AM. Pt notified to arrive at 10:30 AM.  Labs Discussed [employer] genetic screening with patient. Would like both Panorama and Horizon drawn at new OB visit. Routine prenatal labs needed.  COVID Vaccine Patient has not had COVID vaccine.   Is patient a CenteringPregnancy candidate?  Declined Declined due to Declined to say  Is patient a Mom+Baby Combined Care candidate?  Not a candidate   If accepted, Mom+Baby staff notified  Social Determinants of Health Food Insecurity: Patient expresses food insecurity. Food Market information given to patient; explained patient may visit at the end of first OB appointment. WIC Referral: Patient is interested in referral to [facility].  Transportation: Patient denies transportation needs. Childcare: Discussed no children allowed at ultrasound appointments. Offered childcare services; patient declines childcare services at this time.  First visit review I reviewed new OB appt with patient. I explained they will have a provider visit that includes initial ob labs, genetic screening, and may require pelvic exam. Explained pt will be seen by Gabrielle Anger A Anyanwu  MD at first visit; encounter routed to  appropriate provider. Explained that patient will be seen by pregnancy navigator following visit with provider.   Vidal Schwalbe, [place] 01/07/2022  1:17 PM

## 2022-01-12 ENCOUNTER — Encounter: Payer: Self-pay | Admitting: Obstetrics & Gynecology

## 2022-02-11 ENCOUNTER — Encounter: Payer: Medicaid Other | Admitting: Family Medicine

## 2022-02-27 ENCOUNTER — Ambulatory Visit: Payer: Medicaid Other

## 2022-02-27 ENCOUNTER — Other Ambulatory Visit: Payer: Medicaid Other

## 2023-06-02 IMAGING — CT CT RENAL STONE PROTOCOL
2 of 4 series · 17 of 46 positions shown, 19 images · non-contrast
Comparison: 10/23/2019 CT and prior studies

CLINICAL DATA: 29-year-old female with acute RIGHT abdominal and
flank pain for several days. History of gastric bypass and hernia
repair.

EXAM:
CT ABDOMEN AND PELVIS WITHOUT CONTRAST
TECHNIQUE: Multidetector CT imaging of the abdomen and pelvis was performed
following the standard protocol without IV contrast.

[Series 2: axial st · axial · 0.77mm/px · z∈[+514,+884]mm · 14 of 82 slices shown, 16 images]
[im 4/82  soft-tissue]
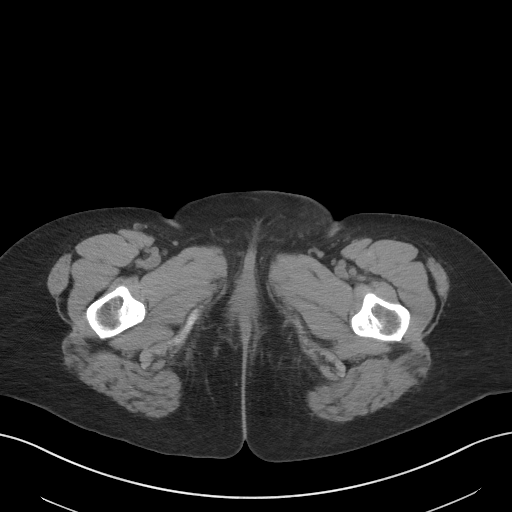
[im 4/82  bone]
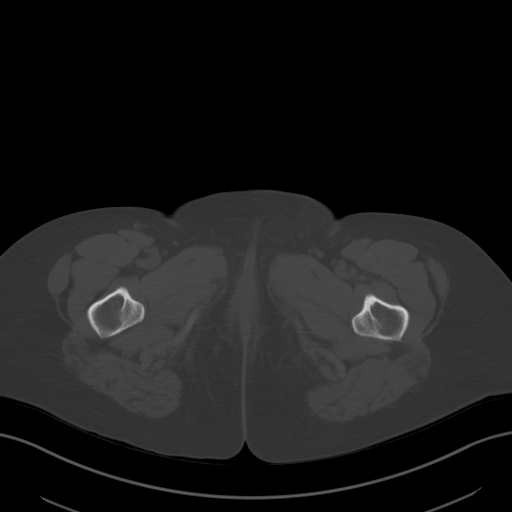
[im 10/82  soft-tissue]
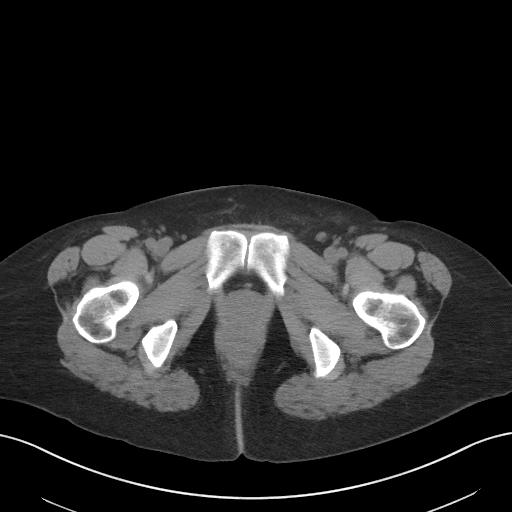
[im 17/82  soft-tissue]
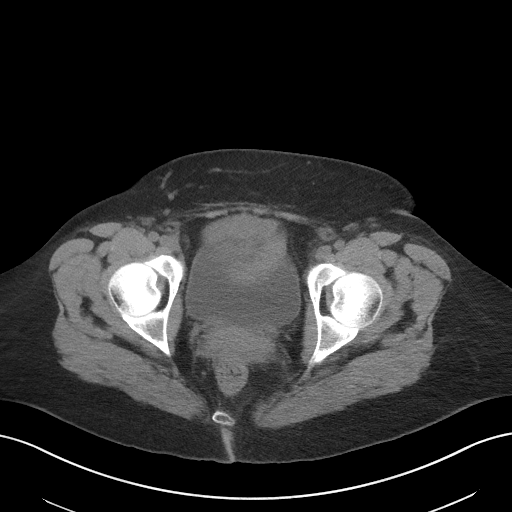
[im 23/82  soft-tissue]
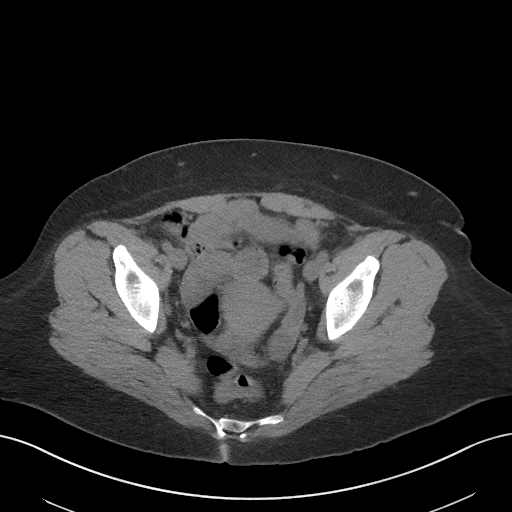
[im 26/82  soft-tissue]
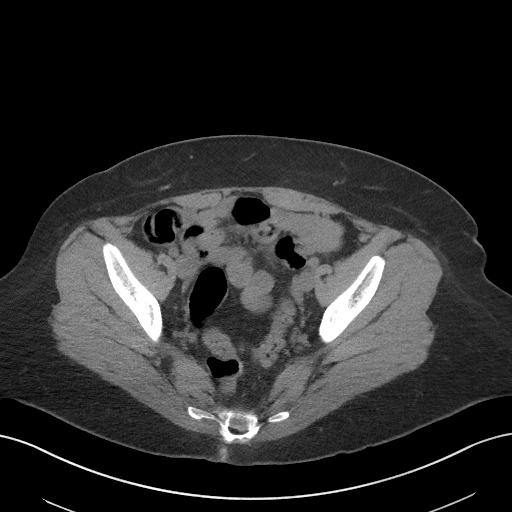
[im 33/82  soft-tissue]
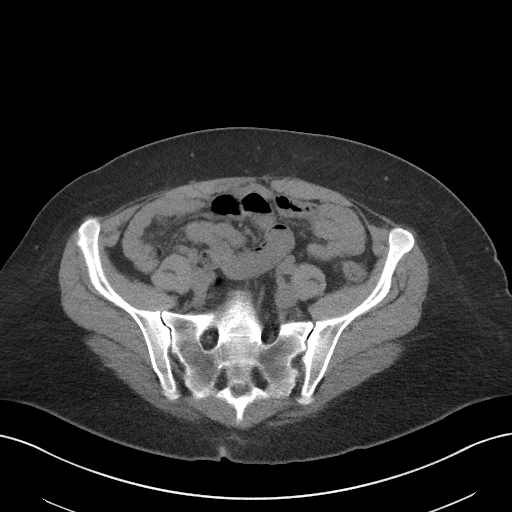
[im 39/82  soft-tissue]
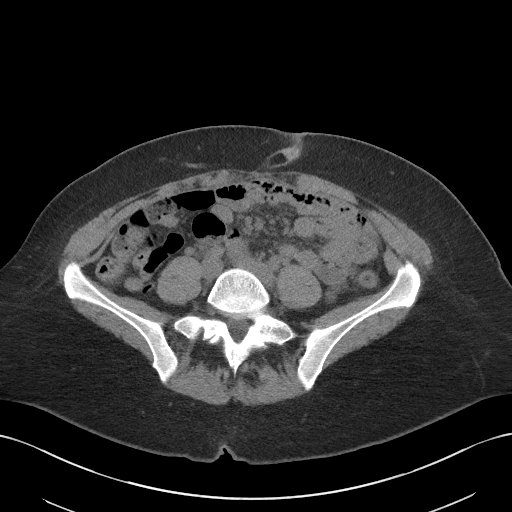
[im 43/82  soft-tissue]
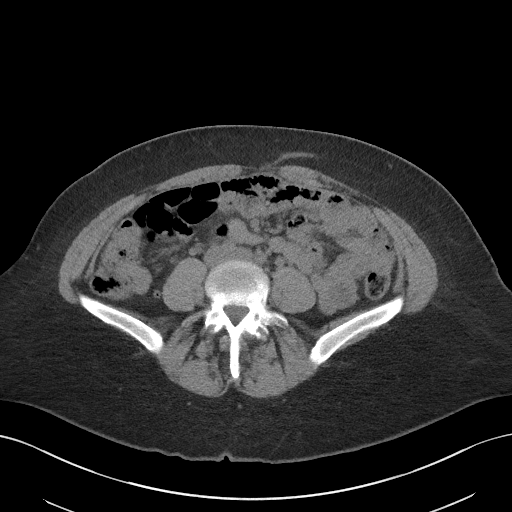
[im 49/82  soft-tissue]
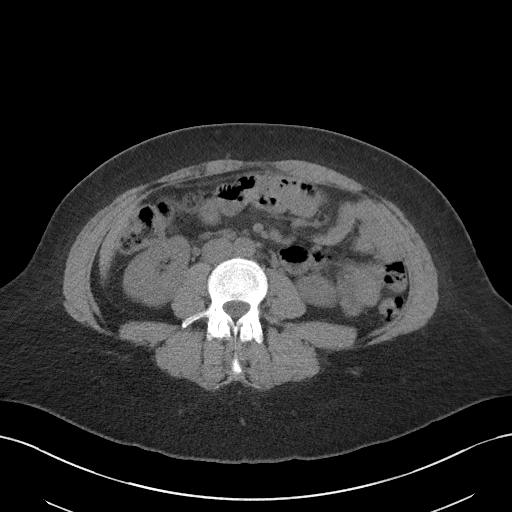
[im 49/82  bone]
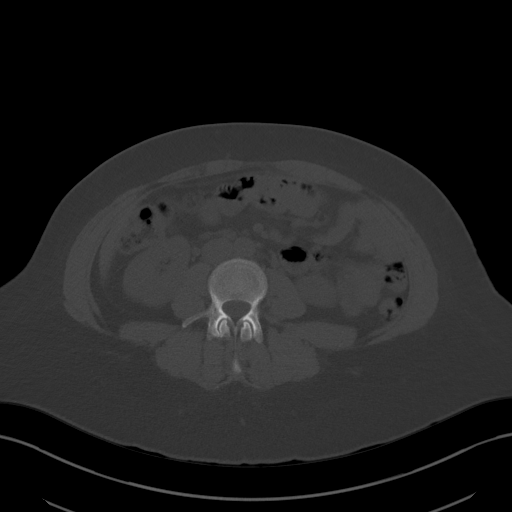
[im 56/82  soft-tissue]
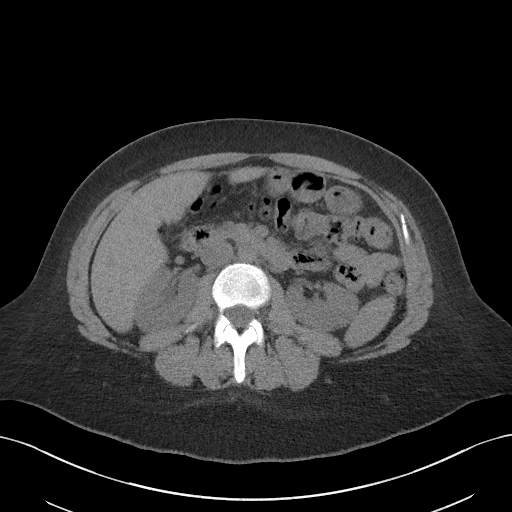
[im 62/82  soft-tissue]
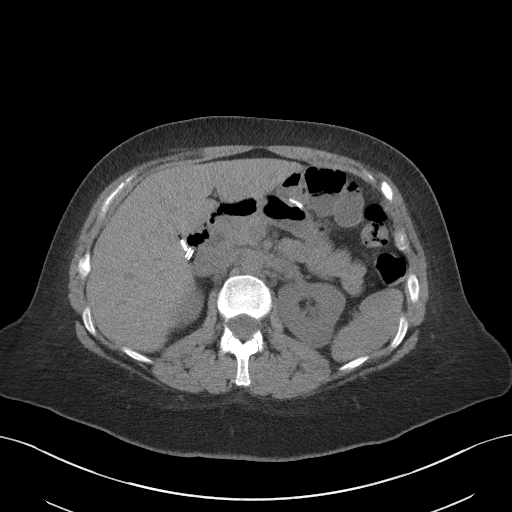
[im 65/82  soft-tissue]
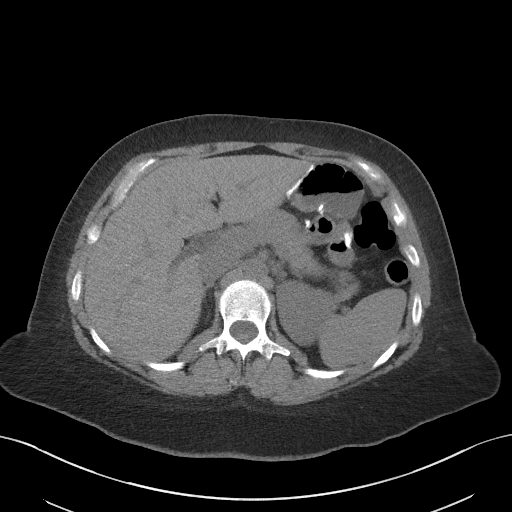
[im 72/82  soft-tissue]
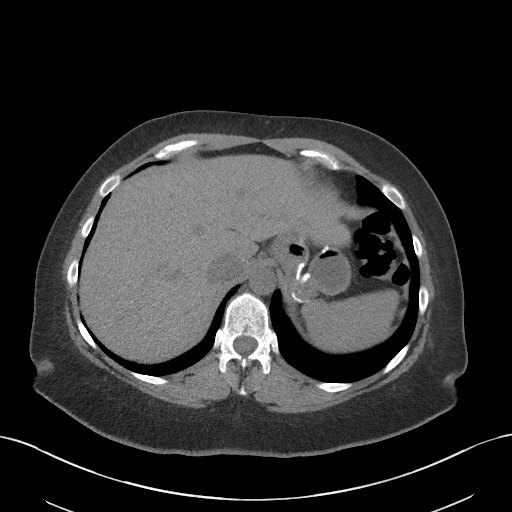
[im 78/82  soft-tissue]
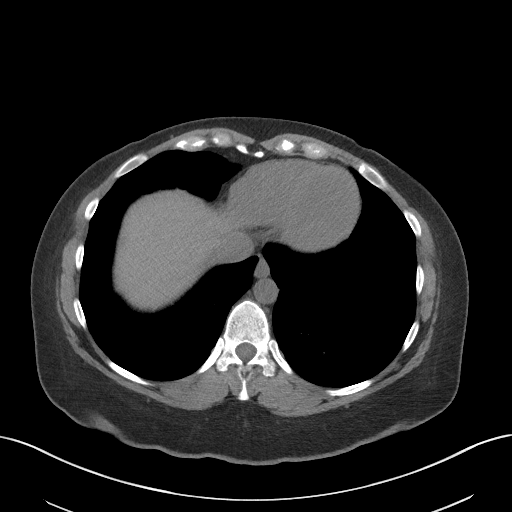

[Series 5: coronal st · coronal · 0.83mm/px · 3 of 97 slices shown]
[im 33/97  soft-tissue]
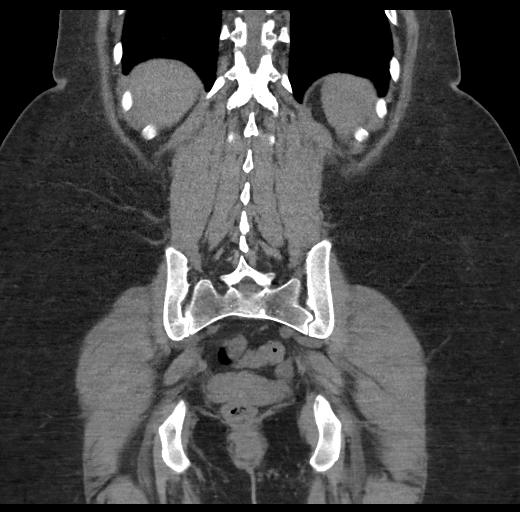
[im 43/97  soft-tissue]
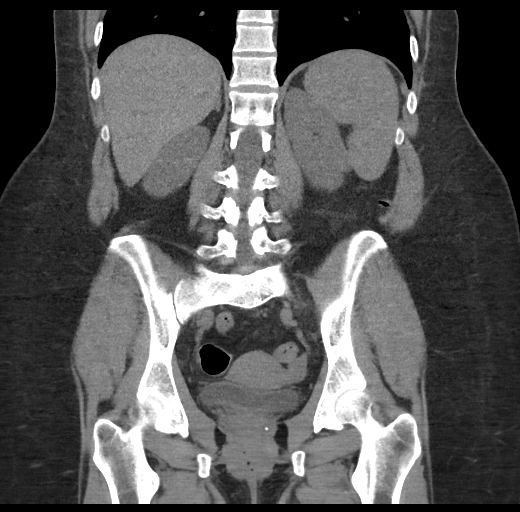
[im 54/97  soft-tissue]
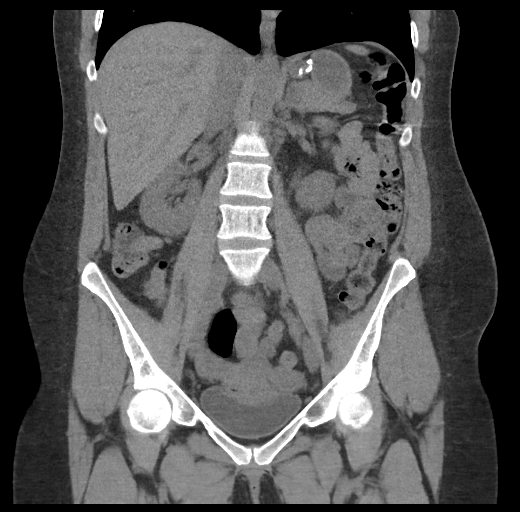

[17 of 46 positions shown; findings below may reference images not displayed]

FINDINGS: Please note that parenchymal abnormalities may be missed without
intravenous contrast.

Lower chest: No acute abnormality

Hepatobiliary: The liver is unremarkable. The patient is status post
cholecystectomy. No biliary dilatation.

Pancreas: Unremarkable

Spleen: Unremarkable

Adrenals/Urinary Tract: The kidneys, adrenal glands and bladder are
unremarkable except for a punctate nonobstructing RIGHT LOWER pole
renal calculus.

Stomach/Bowel: There is no evidence of dilated bowel loops, bowel
obstruction, definite bowel wall thickening or inflammatory changes.
The appendix is normal. Gastric bypass changes are present.

Vascular/Lymphatic: No significant vascular findings are present. No
enlarged abdominal or pelvic lymph nodes.

Reproductive: Uterus and bilateral adnexa are unremarkable.

Other: No ascites, focal collection or pneumoperitoneum.

Musculoskeletal: No acute or suspicious bony abnormalities noted.
IMPRESSION: 1. No evidence of acute abnormality.
2. RIGHT nephrolithiasis.
3. Gastric bypass changes without definite complicating features.
